# Patient Record
Sex: Male | Born: 1985 | Race: Black or African American | Hispanic: No | Marital: Single
Health system: Southern US, Community
[De-identification: ages and names within clinical notes are randomized; demographics above are authoritative.]

## PROBLEM LIST (undated history)

## (undated) ENCOUNTER — Emergency Department (HOSPITAL_COMMUNITY): Payer: Self-pay

## (undated) DIAGNOSIS — R443 Hallucinations, unspecified: Secondary | ICD-10-CM

## (undated) DIAGNOSIS — F319 Bipolar disorder, unspecified: Secondary | ICD-10-CM

## (undated) DIAGNOSIS — F29 Unspecified psychosis not due to a substance or known physiological condition: Secondary | ICD-10-CM

---
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
## 2007-08-08 ENCOUNTER — Emergency Department (HOSPITAL_COMMUNITY): Admission: EM | Admit: 2007-08-08 | Discharge: 2007-08-09 | Payer: Self-pay | Admitting: Emergency Medicine

## 2008-06-25 ENCOUNTER — Emergency Department (HOSPITAL_COMMUNITY): Admission: EM | Admit: 2008-06-25 | Discharge: 2008-06-25 | Payer: Self-pay | Admitting: Emergency Medicine

## 2008-09-11 ENCOUNTER — Emergency Department (HOSPITAL_COMMUNITY): Admission: EM | Admit: 2008-09-11 | Discharge: 2008-09-11 | Payer: Self-pay | Admitting: Emergency Medicine

## 2008-09-11 ENCOUNTER — Ambulatory Visit: Payer: Self-pay | Admitting: Psychiatry

## 2008-09-11 ENCOUNTER — Inpatient Hospital Stay (HOSPITAL_COMMUNITY): Admission: RE | Admit: 2008-09-11 | Discharge: 2008-09-15 | Payer: Self-pay | Admitting: Psychiatry

## 2008-09-14 ENCOUNTER — Ambulatory Visit (HOSPITAL_COMMUNITY): Admission: RE | Admit: 2008-09-14 | Discharge: 2008-09-14 | Payer: Self-pay | Admitting: Psychiatry

## 2009-10-17 ENCOUNTER — Emergency Department (HOSPITAL_COMMUNITY): Admission: EM | Admit: 2009-10-17 | Discharge: 2009-10-17 | Payer: Self-pay | Admitting: Emergency Medicine

## 2010-08-02 ENCOUNTER — Emergency Department (HOSPITAL_COMMUNITY)
Admission: EM | Admit: 2010-08-02 | Discharge: 2010-08-02 | Payer: Self-pay | Source: Home / Self Care | Admitting: Emergency Medicine

## 2010-10-20 LAB — BASIC METABOLIC PANEL
BUN: 7 mg/dL (ref 6–23)
Chloride: 106 mEq/L (ref 96–112)
Creatinine, Ser: 0.86 mg/dL (ref 0.4–1.5)
GFR calc non Af Amer: >60 mL/min (ref >60)

## 2010-10-20 LAB — DIFFERENTIAL
Basophils Relative: 0 % (ref 0–1)
Eosinophils Relative: 1 % (ref 0–5)
Lymphocytes Relative: 21 % (ref 12–46)
Monocytes Absolute: 0.5 10*3/uL (ref 0.1–1.0)
Monocytes Relative: 9 % (ref 3–12)
Neutro Abs: 4.3 10*3/uL (ref 1.7–7.7)

## 2010-10-20 LAB — ETHANOL: Alcohol, Ethyl (B): <5 mg/dL (ref 0–10)

## 2010-10-20 LAB — CBC
MCHC: 34.3 g/dL (ref 30.0–36.0)
MCV: 92.8 fL (ref 78.0–100.0)
RDW: 13.1 % (ref 11.5–15.5)
WBC: 6.1 10*3/uL (ref 4.0–10.5)

## 2010-10-20 LAB — RAPID URINE DRUG SCREEN, HOSP PERFORMED
Benzodiazepines: NOT DETECTED
Cocaine: NOT DETECTED
Tetrahydrocannabinol: NOT DETECTED

## 2010-11-22 NOTE — H&P (Signed)
NAMECALLAGHAN, Todd NO.:  [id number]   MEDICAL RECORD NO.:  [id number]          PATIENT TYPE:  IPS   LOCATION:  0508                          FACILITY:  [facility]   PHYSICIAN:  Vic Ripper, P.A.-C.DATE OF BIRTH:  Apr 24, 1986   DATE OF ADMISSION:  09/11/2008  DATE OF DISCHARGE:                       PSYCHIATRIC ADMISSION ASSESSMENT   This is an involuntary admission to the services of Dr. Geoffery Lyons.  The commitment papers indicate that he has had no prior psychiatric  complaints.  He is now presenting with auditory hallucinations with  religious overtones.  He has been speaking with Jesus and he believes  that he is the son of Jesus.  Apparently he has been staying with  various people for the past 3 weeks.  He called his parents at 3:00 a.m.  this morning reporting he was hearing voices. He reported that he was  abusing over-the-counter cough medicine 3 days ago and asked them to  help him. According to the record there is no prior psychiatric history.  Unfortunately his social history and family history are unobtainable as  the patient is not responding to questions at this point in time. There  is no known history for substance abuse other than him reporting abusing  cough medicine 3 days ago.  His UDS in the ED was negative.  He does not  have a primary care [name].  He has no known medical problems.  He is  not currently prescribed any medications.  He has no known drug  allergies.   POSITIVE PHYSICAL FINDINGS:  His he was medically cleared in the  emergency department.  He has not yet had a CT scan of the head, but he  was not able to cooperate with that yesterday and as soon as he is able  to cooperate and collaborate we will obtain one. His other labs showed  he had no alcohol.  His UDS was clear.  His electrolytes showed that his  glucose was slightly elevated at 110.  His CBC had no abnormalities. As  already stated his electrolytes showed only a  slight elevation of  glucose at 110.   Vital signs on admission to our unit show he is 69 inches tall.  He  weighs 168-1/2 pounds.  His temperature is 97.6, blood pressure is  156/86, pulse 64-81, respirations are 20.   He is not known to have had any surgeries.   MENTAL STATUS EXAM:  He is alert.  He is appropriately groomed and  dressed.  He appears to be responding to internal stimuli.  He has an  odd affect.  He is not speaking to me at any rate. His thought processes  were unable to be assessed and his judgment and insight appear to be  impaired. Concentration and memory he is easily distracted. Intelligence  is reported to be normal according to his parents. He is not behaving in  a self-injurious manner or manner that appears to be harmful to others  at this time.  He is actively psychotic and responding to internal  stimuli   IMPRESSION:  AXIS I:  Psychotic disorder NOS.  AXIS II:  Deferred.  AXIS III:  None known.  AXIS IV:  Severe.  AXIS V:  20.   [name] had already been seen by Dr. Lolly Mustache.  He will be transferred to  the [address]. He will be given Zyprexa 5 mg at bedtime and Zyprexa Zydis  5 mg p.o. q.8 h. p.r.n. agitation.  Estimated length of stay is unknown.      Vic Ripper, P.A.-C.     MD/MEDQ  D:  09/12/2008  T:  09/13/2008  Job:  [id number]

## 2010-11-25 NOTE — Discharge Summary (Signed)
NAMEHAGER, COMPSTON NO.:  [id number]   MEDICAL RECORD NO.:  [id number]          PATIENT TYPE:  IPS   LOCATION:  0508                          FACILITY:  [facility]   PHYSICIAN:  Geoffery Lyons, M.D.      DATE OF BIRTH:  Nov 10, 1985   DATE OF ADMISSION:  09/11/2008  DATE OF DISCHARGE:  09/15/2008                               DISCHARGE SUMMARY   CHIEF COMPLAINT:  This was the first admission to [facility] for this 25 year old male.  Commitment papers indicated he had no  prior psychiatric complaints.  Presented with auditory hallucination,  religious overtones, has been speaking with Jesus and believes that he  is the son of Jesus.  Had been staying with various people for the past  3 weeks.  He called his parents at 3:00 a.m. reporting was hearing  voices, was abusing over-the-counter cough medicine, asked for help.   PAST PSYCHIATRIC HISTORY:  Denies previous treatment or symptomatology.   ALCOHOL AND DRUG HABITS:  Reports abusing over-the-counter cough  medicine.  No other substances.   MEDICAL HISTORY:  Noncontributory.   LABORATORY WORK:  UDS was negative, glucose 110.  CBC within normal  limits.   PHYSICAL EXAMINATION:  Failed to show any acute findings.   MENTAL STATUS EXAM:  Reveals alert cooperative male but appears to be  responding to internal stimuli.  He exhibits some inappropriate affect.  Not spontaneous, somewhat reserved, somewhat guarded.  At times, he  would not share what was happening.   DIAGNOSES:  AXIS I:  Psychotic disorder NOS versus substance-induced  psychosis.  AXIS II:  No diagnosis.  AXIS III:  No diagnosis.  AXIS IV: Moderate.  AXIS V:  On admission 35-40, in the last year 60.   COURSE IN THE HOSPITAL:  Was admitted, was started in individual and  group psychotherapy.  As already stated, admitted secondary to paranoia  with auditory hallucinations.  Talking to self.  He was prescribed  Zyprexa. On March 7, he was  not seen actively responding to internal  stimuli, but did exhibit some anxiety.  March 8, he admitted to  marijuana abuse as well as cough pills to get high, endorsed the  depression happened first and then he heard voices.  He had been asked  to leave parents' home in January 10 due to not following rules.  Had  been living with the grandmother and staying with the aunt some.  Grandmother was contacted, and he felt that he was back to his baseline.  March 9, he was in full contact with reality.  No active  suicidal/homicidal ideas, no hallucinations or delusions.  Head CT scan  to rule out any other organic factors was negative.  We went ahead and  discharged to outpatient follow-up.   DISCHARGE DIAGNOSES:  AXIS I:  Marijuana hallucinogen abuse.  Psychotic  disorder not otherwise specified.  Rule out substance-induced psychoses  versus depression with psychosis.  AXIS II:  No diagnosis.  AXIS III:  No diagnosis.  AXIS IV:  Moderate.  AXIS V:  On  discharge 50.   DISCHARGE MEDICATIONS:  Discharged on Zyprexa 5 mg at bedtime.   FOLLOWUP:  Follow-up with Dr. Lang Snow at [facility]      Geoffery Lyons, M.D.  Electronically Signed     IL/MEDQ  D:  10/12/2008  T:  10/12/2008  Job:  [id number]

## 2011-02-27 ENCOUNTER — Emergency Department (HOSPITAL_COMMUNITY)
Admission: EM | Admit: 2011-02-27 | Discharge: 2011-02-27 | Disposition: A | Discharge status: Home / Self Care | Payer: Self-pay | Attending: Emergency Medicine | Admitting: Emergency Medicine

## 2011-02-27 ENCOUNTER — Emergency Department (HOSPITAL_COMMUNITY): Payer: Self-pay

## 2011-02-27 DIAGNOSIS — IMO0002 Reserved for concepts with insufficient information to code with codable children: Secondary | ICD-10-CM | POA: Insufficient documentation

## 2011-02-27 DIAGNOSIS — S20229A Contusion of unspecified back wall of thorax, initial encounter: Secondary | ICD-10-CM | POA: Insufficient documentation

## 2011-02-27 DIAGNOSIS — M545 Low back pain, unspecified: Secondary | ICD-10-CM | POA: Insufficient documentation

## 2011-02-27 DIAGNOSIS — M25529 Pain in unspecified elbow: Secondary | ICD-10-CM | POA: Insufficient documentation

## 2011-02-27 DIAGNOSIS — S5000XA Contusion of unspecified elbow, initial encounter: Secondary | ICD-10-CM | POA: Insufficient documentation

## 2011-02-27 DIAGNOSIS — F319 Bipolar disorder, unspecified: Secondary | ICD-10-CM | POA: Insufficient documentation

## 2011-04-14 ENCOUNTER — Emergency Department (HOSPITAL_COMMUNITY)
Admission: EM | Admit: 2011-04-14 | Discharge: 2011-04-14 | Disposition: A | Discharge status: Home / Self Care | Payer: Self-pay | Attending: Emergency Medicine | Admitting: Emergency Medicine

## 2011-04-14 DIAGNOSIS — F319 Bipolar disorder, unspecified: Secondary | ICD-10-CM | POA: Insufficient documentation

## 2011-04-14 DIAGNOSIS — R51 Headache: Secondary | ICD-10-CM | POA: Insufficient documentation

## 2011-04-14 LAB — URINALYSIS, ROUTINE W REFLEX MICROSCOPIC
Bilirubin Urine: NEGATIVE
Hgb urine dipstick: NEGATIVE
Ketones, ur: NEGATIVE mg/dL
Specific Gravity, Urine: 1.017 (ref 1.005–1.030)
pH: 6.5 (ref 5.0–8.0)

## 2011-08-15 ENCOUNTER — Emergency Department (HOSPITAL_COMMUNITY)
Admission: EM | Admit: 2011-08-15 | Discharge: 2011-08-15 | Disposition: A | Discharge status: Home / Self Care | Payer: Self-pay | Attending: Emergency Medicine | Admitting: Emergency Medicine

## 2011-08-15 ENCOUNTER — Encounter (HOSPITAL_COMMUNITY): Payer: Self-pay | Admitting: Emergency Medicine

## 2011-08-15 DIAGNOSIS — Z760 Encounter for issue of repeat prescription: Secondary | ICD-10-CM | POA: Insufficient documentation

## 2011-08-15 DIAGNOSIS — F39 Unspecified mood [affective] disorder: Secondary | ICD-10-CM | POA: Insufficient documentation

## 2011-08-15 DIAGNOSIS — R4586 Emotional lability: Secondary | ICD-10-CM

## 2011-08-15 DIAGNOSIS — F319 Bipolar disorder, unspecified: Secondary | ICD-10-CM | POA: Insufficient documentation

## 2011-08-15 DIAGNOSIS — Z79899 Other long term (current) drug therapy: Secondary | ICD-10-CM | POA: Insufficient documentation

## 2011-08-15 HISTORY — DX: Bipolar disorder, unspecified: F31.9

## 2011-08-15 MED ORDER — OLANZAPINE 5 MG PO TABS: 5.0000 mg | ORAL_TABLET | Freq: Every day | ORAL | Status: DC

## 2011-08-15 NOTE — ED Notes (Signed)
PT. REQUESTING PRESCRIPTION FOR ZYPREXA STATES HE RAN OUT FOR SEVERAL MONTHS , DENIES SUICIDAL OR HOMICIDAL IDEATION . CALM WITH NO DISTRESS AT TRIAGE.

## 2011-08-15 NOTE — ED Notes (Signed)
States took self off Zyprexa x1 yr ago; recently has developed interm episodes of anger and feels as if he needs to go back on Zyprexa; presently awake, alert, oriented, approp. in conversation; denies SI nor HI; appropriately dressed; however, poor self care; malodorous body; mother at bedside with pt; pt interm talking on cell phone, laughing, smiling

## 2011-08-15 NOTE — ED Provider Notes (Signed)
History     CSN: [id number]  Arrival date & time 08/15/11  2010   First MD Initiated Contact with Patient 08/15/11 2103      Chief Complaint  Patient presents with  . Medication Refill    (Consider location/radiation/quality/duration/timing/severity/associated sxs/prior treatment) HPI This 26 year old male has a history of impulse control disorder with labile mood swings which were well controlled previously with Zyprexa, recently has had some labile emotions again but is not currently anxious or depressed a lot, is not a threat to himself or others he is not suicidal or homicidal he is not hallucinating he is lucid, and cooperative at the present time and wants to be started Zyprexa so he can not have such labile emotions again his mom is with him and corroborates his story he otherwise feels well at this time. Past Medical History  Diagnosis Date  . Bipolar 1 disorder     History reviewed. No pertinent past surgical history.  No family history on file.  History  Substance Use Topics  . Smoking status: Never Smoker   . Smokeless tobacco: Not on file  . Alcohol Use: Yes      Review of Systems  Constitutional: Negative for fever.       10 Systems reviewed and are negative for acute change except as noted in the HPI.  HENT: Negative for congestion.   Eyes: Negative for discharge and redness.  Respiratory: Negative for cough and shortness of breath.   Cardiovascular: Negative for chest pain.  Gastrointestinal: Negative for vomiting and abdominal pain.  Musculoskeletal: Negative for back pain.  Skin: Negative for rash.  Neurological: Negative for syncope, numbness and headaches.  Psychiatric/Behavioral:       No behavior change.    Allergies  Review of patient's allergies indicates no known allergies.  Home Medications   Current Outpatient Rx  Name Route Sig Dispense Refill  . OLANZAPINE 5 MG PO TABS Oral Take 1 tablet (5 mg total) by mouth at bedtime. 30 tablet 0      BP 123/74  Pulse 85  Temp(Src) 98 F (36.7 C) (Oral)  Resp 20  SpO2 98%  Physical Exam  Nursing note and vitals reviewed. Constitutional: He is oriented to person, place, and time.       Awake, alert, nontoxic appearance.  HENT:  Head: Atraumatic.  Eyes: Right eye exhibits no discharge. Left eye exhibits no discharge.  Neck: Neck supple.  Pulmonary/Chest: Effort normal. He exhibits no tenderness.  Abdominal: Soft. There is no tenderness. There is no rebound.  Musculoskeletal: He exhibits no tenderness.       Baseline ROM, no obvious new focal weakness.  Neurological: He is alert and oriented to person, place, and time.       Mental status and motor strength appears baseline for patient and situation.  Skin: No rash noted.  Psychiatric: He has a normal mood and affect.    ED Course  Procedures (including critical care time)  Labs Reviewed - No data to display No results found.   1. Medication refill   2. Mood swings       MDM          Hurman Horn, MD 08/16/11 2109

## 2011-09-30 ENCOUNTER — Emergency Department (HOSPITAL_COMMUNITY)
Admission: EM | Admit: 2011-09-30 | Discharge: 2011-09-30 | Disposition: A | Discharge status: Home / Self Care | Payer: Self-pay | Attending: Emergency Medicine | Admitting: Emergency Medicine

## 2011-09-30 ENCOUNTER — Encounter (HOSPITAL_COMMUNITY): Payer: Self-pay | Admitting: Emergency Medicine

## 2011-09-30 DIAGNOSIS — F319 Bipolar disorder, unspecified: Secondary | ICD-10-CM | POA: Insufficient documentation

## 2011-09-30 DIAGNOSIS — R51 Headache: Secondary | ICD-10-CM | POA: Insufficient documentation

## 2011-09-30 MED ORDER — KETOROLAC TROMETHAMINE 60 MG/2ML IM SOLN
60.0000 mg | Freq: Once | INTRAMUSCULAR | Status: AC
  Administered 2011-09-30: 60 mg via INTRAMUSCULAR
  Filled 2011-09-30: qty 2

## 2011-09-30 MED ORDER — IBUPROFEN 400 MG PO TABS: 400.0000 mg | ORAL_TABLET | Freq: Four times a day (QID) | ORAL | Status: AC | PRN

## 2011-09-30 MED ORDER — LORATADINE 10 MG PO TABS: 10.0000 mg | ORAL_TABLET | Freq: Every day | ORAL | Status: DC

## 2011-09-30 NOTE — Discharge Instructions (Signed)
Headache and Allergies The relationship between allergies and headaches is unclear. Many people with allergic or infectious nasal problems also have headaches (migraines or sinus headaches). However, sometimes allergies can cause pressure that feels like a headache, and sometimes headaches can cause allergy-like symptoms. It is not always clear whether your symptoms are caused by allergies or by a headache. CAUSES   Migraine: The cause of a migraine is not always known.   Sinus Headache: The cause of a sinus headache may be a sinus infection. Other conditions that may be related to sinus headaches include:   Hay fever (allergic rhinitis).   Deviation of the nasal septum.   Swelling or clogging of the nasal passages.  SYMPTOMS  Migraine headache symptoms (which often last 4 to 72 hours) include:  Intense, throbbing pain on one or both sides of the head.   Nausea.   Vomiting.   Being extra sensitive to light.   Being extra sensitive to sound.   Nervous system reactions that appear similar to an allergic reaction:   Stuffy nose.   Runny nose.   Tearing.  Sinus headaches are felt as facial pain or pressure.  DIAGNOSIS  Because there is some overlap in symptoms, sinus and migraine headaches are often misdiagnosed. For example, a person with migraines may also feel facial pressure. Likewise, many people with hay fever may get migraine headaches rather than sinus headaches. These migraines can be triggered by the histamine release during an allergic reaction. An antihistamine medicine can eliminate this pain. There are standard criteria that help clarify the difference between these headaches and related allergy or allergy-like symptoms. Your caregiver can use these criteria to determine the proper diagnosis and provide you the best care. TREATMENT  Migraine medicine may help people who have persistent migraine headaches even though their hay fever is controlled. For some people,  anti-inflammatory treatments do not work to relieve migraines. Medicines called triptans (such as sumatriptan) can be helpful for those people. Document Released: 09/16/2003 Document Revised: 06/15/2011 Document Reviewed: 10/08/2009 [facility] Patient Information 2012 [place], [place].  Return for any new or worsening symptoms or any other concerns.

## 2011-09-30 NOTE — ED Notes (Addendum)
Per pt; reports he has been having bil ear pain and headaches for 1-2 days; no drainage noted on assessment; pt reports he does not see any drainage; pt a&OX4, in NAD

## 2011-09-30 NOTE — ED Provider Notes (Signed)
History     CSN: [id number]  Arrival date & time 09/30/11  1745   First MD Initiated Contact with Patient 09/30/11 1855      Chief Complaint  Patient presents with  . Otalgia    (Consider location/radiation/quality/duration/timing/severity/associated sxs/prior treatment) HPI CC HA off and on for 2 weeks, global, throbbing, slow onset, mild to moderate.  Pt also states he has had mild bilat ear pain for the past 2 days, none now.   Past Medical History  Diagnosis Date  . Bipolar 1 disorder     History reviewed. No pertinent past surgical history.  History reviewed. No pertinent family history.  History  Substance Use Topics  . Smoking status: Never Smoker   . Smokeless tobacco: Not on file  . Alcohol Use: No      Review of Systems  Constitutional: Negative for fever and chills.  HENT: Positive for ear pain. Negative for sore throat, neck pain and neck stiffness.   Eyes: Negative for visual disturbance.  Respiratory: Negative for cough and shortness of breath.   Musculoskeletal: Negative for gait problem.  Neurological: Positive for headaches. Negative for speech difficulty, weakness, light-headedness and numbness.  All other systems reviewed and are negative.    Allergies  Review of patient's allergies indicates no known allergies.  Home Medications   Current Outpatient Rx  Name Route Sig Dispense Refill  . ARIPIPRAZOLE 5 MG PO TABS Oral Take 5 mg by mouth daily.    . IBUPROFEN 400 MG PO TABS Oral Take 1 tablet (400 mg total) by mouth every 6 (six) hours as needed for pain. 30 tablet 0  . LORATADINE 10 MG PO TABS Oral Take 1 tablet (10 mg total) by mouth daily. 30 tablet 0    BP 138/90  Pulse 85  Temp(Src) 97.3 F (36.3 C) (Oral)  Resp 20  SpO2 98%ra wnl  Physical Exam  Nursing note and vitals reviewed. Constitutional: He is oriented to person, place, and time. He appears well-developed and well-nourished.  HENT:  Head: Normocephalic and atraumatic.   Right Ear: Hearing, tympanic membrane, external ear and ear canal normal. No tenderness.  Left Ear: Hearing, tympanic membrane, external ear and ear canal normal. No tenderness.  Eyes: EOM are normal. Pupils are equal, round, and reactive to light. Right eye exhibits no discharge. Left eye exhibits no discharge. Left eye exhibits no nystagmus.  Neck: Normal range of motion. Neck supple.  Cardiovascular: Normal rate, regular rhythm and normal heart sounds.   Pulmonary/Chest: Effort normal and breath sounds normal.  Abdominal: Soft. There is no tenderness.  Musculoskeletal: Normal range of motion. He exhibits no tenderness.  Neurological: He is alert and oriented to person, place, and time. He has normal strength. No cranial nerve deficit or sensory deficit. Coordination and gait normal. GCS eye subscore is 4. GCS verbal subscore is 5. GCS motor subscore is 6.  Skin: Skin is warm and dry.  Psychiatric: He has a normal mood and affect. His behavior is normal.    ED Course  Procedures (including critical care time)  Labs Reviewed - No data to display No results found.   1. Headache       MDM  Pt is in nad, afvss, nontoxic appearing, exam and hx as above.  Doubt infection, no dental source on exam, doubt ich, nl neuro exam including no trunkal or limb abnl coordination, nl gait, doubt mass lesion.  Likely tension or nonspecific HA, possibly related to seasonal allergies.  Will give  toradol, d/c with ibuprofen and loratidine.        Elijio Miles, MD 09/30/11 [phone]

## 2011-09-30 NOTE — ED Provider Notes (Signed)
I saw and evaluated the patient, reviewed the resident's note and I agree with the findings and plan.  Gradual onset headaches off-and-on over the last week with no headache now.  Hurman Horn, MD 10/02/11 2224

## 2012-09-13 ENCOUNTER — Emergency Department (HOSPITAL_COMMUNITY)
Admission: EM | Admit: 2012-09-13 | Discharge: 2012-09-13 | Disposition: A | Discharge status: Home / Self Care | Payer: Self-pay | Attending: Emergency Medicine | Admitting: Emergency Medicine

## 2012-09-13 DIAGNOSIS — R51 Headache: Secondary | ICD-10-CM | POA: Insufficient documentation

## 2012-09-13 DIAGNOSIS — Z79899 Other long term (current) drug therapy: Secondary | ICD-10-CM | POA: Insufficient documentation

## 2012-09-13 DIAGNOSIS — F319 Bipolar disorder, unspecified: Secondary | ICD-10-CM | POA: Insufficient documentation

## 2012-09-13 DIAGNOSIS — R112 Nausea with vomiting, unspecified: Secondary | ICD-10-CM | POA: Insufficient documentation

## 2012-09-13 MED ORDER — SODIUM CHLORIDE 0.9 % IV SOLN
INTRAVENOUS | Status: DC
  Administered 2012-09-13: 21:00:00 via INTRAVENOUS

## 2012-09-13 MED ORDER — DIPHENHYDRAMINE HCL 50 MG/ML IJ SOLN
25.0000 mg | Freq: Once | INTRAMUSCULAR | Status: AC
  Administered 2012-09-13: 25 mg via INTRAVENOUS
  Filled 2012-09-13: qty 1

## 2012-09-13 MED ORDER — METOCLOPRAMIDE HCL 5 MG/ML IJ SOLN
10.0000 mg | Freq: Once | INTRAMUSCULAR | Status: AC
  Administered 2012-09-13: 10 mg via INTRAVENOUS
  Filled 2012-09-13: qty 2

## 2012-09-13 MED ORDER — ONDANSETRON HCL 4 MG PO TABS: 4.0000 mg | ORAL_TABLET | Freq: Three times a day (TID) | ORAL | Status: DC | PRN

## 2012-09-13 MED ORDER — KETOROLAC TROMETHAMINE 30 MG/ML IJ SOLN
30.0000 mg | Freq: Once | INTRAMUSCULAR | Status: AC
  Administered 2012-09-13: 30 mg via INTRAVENOUS
  Filled 2012-09-13: qty 1

## 2012-09-13 NOTE — ED Notes (Signed)
The pt has had a headache with n v for 2 days

## 2012-09-13 NOTE — ED Provider Notes (Signed)
Medical screening examination/treatment/procedure(s) were performed by non-physician practitioner and as supervising physician I was immediately available for consultation/collaboration.   Tariq Pernell, MD 09/13/12 2209 

## 2012-09-13 NOTE — ED Provider Notes (Signed)
History     CSN: [id number]  Arrival date & time 09/13/12  2014   First MD Initiated Contact with Patient 09/13/12 2036      Chief Complaint  Patient presents with  . Headache    (Consider location/radiation/quality/duration/timing/severity/associated sxs/prior treatment) HPI Comments: This is a 27 year old male, no pertinent past medical history, who presents emergency department with chief complaint of headache. Patient states that the headache started approximately 2 days ago, and has gradually worsened. He has associated nausea and vomiting. He has tried taking ibuprofen with little to no relief. States that the pain is 8/10. It does not radiate. He denies photophobia, phonophobia. He states that nothing makes his symptoms better or worse. He denies fever, chills, chest pain, shortness of breath, abdominal pain, diarrhea, constipation, numbness or tingling of the extremities.  The history is provided by the patient. No language interpreter was used.    Past Medical History  Diagnosis Date  . Bipolar 1 disorder     No past surgical history on file.  No family history on file.  History  Substance Use Topics  . Smoking status: Never Smoker   . Smokeless tobacco: Not on file  . Alcohol Use: No      Review of Systems  All other systems reviewed and are negative.    Allergies  Review of patient's allergies indicates no known allergies.  Home Medications   Current Outpatient Rx  Name  Route  Sig  Dispense  Refill  . ARIPiprazole (ABILIFY) 5 MG tablet   Oral   Take 5 mg by mouth daily.         Marland Kitchen loratadine (CLARITIN) 10 MG tablet   Oral   Take 1 tablet (10 mg total) by mouth daily.   30 tablet   0   . EXPIRED: OLANZapine (ZYPREXA) 5 MG tablet   Oral   Take 1 tablet (5 mg total) by mouth at bedtime.   30 tablet   0     BP 120/67  Pulse 96  Temp(Src) 98 F (36.7 C) (Oral)  Resp 20  SpO2 97%  Physical Exam  Nursing note and vitals  reviewed. Constitutional: He is oriented to person, place, and time. He appears well-developed and well-nourished.  HENT:  Head: Normocephalic and atraumatic.  Right Ear: External ear normal.  Left Ear: External ear normal.  No increased pain with chewing.  Eyes: Conjunctivae and EOM are normal. Pupils are equal, round, and reactive to light.  No appreciable papilledema  Neck: Normal range of motion. Neck supple.  No pain with neck flexion, no meningismus  Cardiovascular: Normal rate, regular rhythm and normal heart sounds.  Exam reveals no gallop and no friction rub.   No murmur heard. Pulmonary/Chest: Effort normal and breath sounds normal. No respiratory distress. He has no wheezes. He has no rales. He exhibits no tenderness.  Abdominal: Soft. Bowel sounds are normal. He exhibits no distension and no mass. There is no tenderness. There is no rebound and no guarding.  Musculoskeletal: Normal range of motion. He exhibits no edema and no tenderness.  Normal gait.  Neurological: He is alert and oriented to person, place, and time. He has normal reflexes.  CN 3-12 intact, no pronator drift, normal shin to heel, normal RAM, sensation and strength intact bilaterally.  Skin: Skin is warm and dry.  Psychiatric: He has a normal mood and affect. His behavior is normal. Judgment and thought content normal.    ED Course  Procedures (including critical  care time)  Labs Reviewed - No data to display No results found.   1. Headache   2. Nausea and vomiting       MDM  27 year old male with headache, nausea and vomiting.  No thunderclap onset, no fevers, no signs of meningitis, no focal neurologic deficits.  Patient is feeling much better after migraine cocktail.  He is stable and ready for discharge.  I will send him home with zofran for the nausea.  Discussed the patient with Dr. Manus Gunning, who agrees with the plan.        Roxy Horseman, PA-C 09/13/12 2150

## 2012-09-13 NOTE — ED Notes (Signed)
Pt reports nausea and vomiting since yesterday. Reports developing a headache post nausea and vomiting. States headache has gotten progressively worse. Rates headache pain 8/10. Pt reports last emesis was an hour prior to arrival. Reports taking ibuprofen approximately 2 hrs prior to arrival with no relief. Pt denies photophobia, dizziness, numbness, or tingling in extremities. Pt neuro intact, alert and oriented x 4.

## 2012-09-13 NOTE — ED Notes (Signed)
Out to d/c desk, friend on his way to pick him up from w/r, alert, NAD, calm, interactive, "feels better", denies pain, nausea or HA, tolerated PO fluids, given Rx, steady gait.

## 2013-12-01 ENCOUNTER — Encounter (HOSPITAL_COMMUNITY): Payer: Self-pay | Admitting: Emergency Medicine

## 2013-12-01 ENCOUNTER — Emergency Department (HOSPITAL_COMMUNITY)
Admission: EM | Admit: 2013-12-01 | Discharge: 2013-12-01 | Disposition: A | Discharge status: Home / Self Care | Payer: Self-pay | Attending: Emergency Medicine | Admitting: Emergency Medicine

## 2013-12-01 DIAGNOSIS — R6889 Other general symptoms and signs: Secondary | ICD-10-CM | POA: Insufficient documentation

## 2013-12-01 DIAGNOSIS — G4710 Hypersomnia, unspecified: Secondary | ICD-10-CM | POA: Insufficient documentation

## 2013-12-01 DIAGNOSIS — R634 Abnormal weight loss: Secondary | ICD-10-CM | POA: Insufficient documentation

## 2013-12-01 DIAGNOSIS — F329 Major depressive disorder, single episode, unspecified: Secondary | ICD-10-CM

## 2013-12-01 DIAGNOSIS — F319 Bipolar disorder, unspecified: Secondary | ICD-10-CM | POA: Insufficient documentation

## 2013-12-01 DIAGNOSIS — R519 Headache, unspecified: Secondary | ICD-10-CM

## 2013-12-01 DIAGNOSIS — R5381 Other malaise: Secondary | ICD-10-CM | POA: Insufficient documentation

## 2013-12-01 DIAGNOSIS — F411 Generalized anxiety disorder: Secondary | ICD-10-CM | POA: Insufficient documentation

## 2013-12-01 DIAGNOSIS — G4700 Insomnia, unspecified: Secondary | ICD-10-CM | POA: Insufficient documentation

## 2013-12-01 DIAGNOSIS — R630 Anorexia: Secondary | ICD-10-CM | POA: Insufficient documentation

## 2013-12-01 DIAGNOSIS — R5383 Other fatigue: Secondary | ICD-10-CM

## 2013-12-01 DIAGNOSIS — F430 Acute stress reaction: Secondary | ICD-10-CM | POA: Insufficient documentation

## 2013-12-01 DIAGNOSIS — R51 Headache: Secondary | ICD-10-CM | POA: Insufficient documentation

## 2013-12-01 DIAGNOSIS — F32A Depression, unspecified: Secondary | ICD-10-CM

## 2013-12-01 NOTE — ED Provider Notes (Addendum)
CSN: [id number]     Arrival date & time 12/01/13  1357 History   First MD Initiated Contact with Patient 12/01/13 1617     Chief Complaint  Patient presents with  . Headache  . Anxiety     (Consider location/radiation/quality/duration/timing/severity/associated sxs/prior Treatment) Patient is a 28 y.o. male presenting with headaches, anxiety, and mental health disorder. The history is provided by the patient.  Headache Pain location:  Frontal Quality:  Dull Radiates to:  Does not radiate Severity currently:  8/10 Severity at highest:  6/10 Onset quality:  Gradual Duration:  2 weeks Timing:  Intermittent Progression:  Waxing and waning Chronicity:  Recurrent Similar to prior headaches: yes   Context: emotional stress   Relieved by:  None tried Exacerbated by: not getting adequate sleep. Ineffective treatments:  None tried Associated symptoms: fatigue   Associated symptoms: no blurred vision, no focal weakness, no loss of balance, no neck pain, no neck stiffness, no numbness, no photophobia and no vomiting   Anxiety Associated symptoms include headaches.  Mental Health Problem Presenting symptoms: depression   Presenting symptoms: no suicidal thoughts and no suicidal threats   Degree of incapacity (severity):  Moderate Onset quality:  Gradual Timing:  Constant Progression:  Worsening Chronicity:  Recurrent Context: not alcohol use, not drug abuse, not medication, not recent medication change and not stressful life event   Context comment:  States dx with bipolar and on meds about 3 years ago but has been off for the last 2 years and good until recentl.  denies significant event causing sx Treatment compliance:  Untreated Time since last psychoactive medication taken:  2 years Relieved by:  None tried (not better with sleep) Ineffective treatments:  None tried Associated symptoms: anhedonia, appetite change, distractible, fatigue, headaches, insomnia and weight change    Associated symptoms comment:  Trouble focusing Risk factors: hx of mental illness   Risk factors: no recent psychiatric admission     Past Medical History  Diagnosis Date  . Bipolar 1 disorder    History reviewed. No pertinent past surgical history. History reviewed. No pertinent family history. History  Substance Use Topics  . Smoking status: Never Smoker   . Smokeless tobacco: Not on file  . Alcohol Use: No    Review of Systems  Constitutional: Positive for appetite change and fatigue.  Eyes: Negative for blurred vision and photophobia.  Gastrointestinal: Negative for vomiting.  Musculoskeletal: Negative for neck pain and neck stiffness.  Neurological: Positive for headaches. Negative for focal weakness, numbness and loss of balance.  Psychiatric/Behavioral: Negative for suicidal ideas. The patient has insomnia.   All other systems reviewed and are negative.     Allergies  Review of patient's allergies indicates no known allergies.  Home Medications   Prior to Admission medications   Not on File   BP 135/96  Pulse 76  Temp(Src) 97.9 F (36.6 C) (Oral)  Resp 18  SpO2 96% Physical Exam  Nursing note and vitals reviewed. Constitutional: He is oriented to person, place, and time. He appears well-developed and well-nourished. No distress.  HENT:  Head: Normocephalic and atraumatic.  Mouth/Throat: Oropharynx is clear and moist.  Eyes: Conjunctivae and EOM are normal. Pupils are equal, round, and reactive to light.  Neck: Normal range of motion. Neck supple. No thyromegaly present.  Cardiovascular: Normal rate, regular rhythm and intact distal pulses.   No murmur heard. Pulmonary/Chest: Effort normal and breath sounds normal. No respiratory distress. He has no wheezes. He has no rales.  Abdominal: Soft. He exhibits no distension. There is no tenderness. There is no rebound and no guarding.  Musculoskeletal: Normal range of motion. He exhibits no edema and no  tenderness.  Neurological: He is alert and oriented to person, place, and time.  Skin: Skin is warm and dry. No rash noted. No erythema.  Psychiatric: His speech is normal and behavior is normal. He is not actively hallucinating. He exhibits a depressed mood. He expresses no homicidal and no suicidal ideation. He expresses no suicidal plans and no homicidal plans.    ED Course  Procedures (including critical care time) Labs Review Labs Reviewed - No data to display  Imaging Review No results found.   EKG Interpretation None      MDM   Final diagnoses:  Depression  Generalized headache    Patient here with benign headaches that occur usually towards the end of the day and are worse when he does not get significant sleep. He denies any symptoms concerning for pathologic headache. Low suspicion for space occupying lesion, bleed, subarachnoid hemorrhage. Patient has tried no medication for the headaches recommended Tylenol or ibuprofen. Secondly patient is here complaining of worsening symptoms consistent with depression with fatigue, and periods of insomnia and hypersomnia, and decreased appetite and weight loss. He denies suicidal ideation or hallucinations. He does have a prior history of bipolar disease and hospitalization approximately 3 years ago at which time he was taking medication but has been off all medications for the last 2 years and has been doing well until the last 2 weeks to one month. He is having difficulty focusing at work.  Patient was given outpatient resources [facility]. At this time do not feel that he needs any further evaluation. There is no symptoms concerning for thyroid disease and vital signs are within normal limits with a normal physical exam.    Gwyneth SproutWhitney Ellysia Char, MD 12/01/13 1640  Gwyneth SproutWhitney Tanganyika Bowlds, MD 12/01/13 1642

## 2013-12-01 NOTE — ED Notes (Signed)
Pt brought back to room; pt getting undressed and into a gown at this time; Natasha, NT aware 

## 2013-12-01 NOTE — ED Notes (Signed)
Pt presents to department for evaluation of headache and anxiety. States headache for several days. States he is very stressed out and anxious, unable to focus at work. Denies nausea/vomiting. Denies blurred vision. 8/10 pain upon arrival. No neurological deficits noted. Pt is alert and oriented x4. NAD.

## 2014-05-22 ENCOUNTER — Emergency Department (HOSPITAL_COMMUNITY): Payer: No Typology Code available for payment source

## 2014-05-22 ENCOUNTER — Encounter (HOSPITAL_COMMUNITY): Payer: Self-pay | Admitting: Emergency Medicine

## 2014-05-22 ENCOUNTER — Emergency Department (HOSPITAL_COMMUNITY)
Admission: EM | Admit: 2014-05-22 | Discharge: 2014-05-22 | Disposition: A | Discharge status: Home / Self Care | Payer: No Typology Code available for payment source | Attending: Emergency Medicine | Admitting: Emergency Medicine

## 2014-05-22 DIAGNOSIS — S3992XA Unspecified injury of lower back, initial encounter: Secondary | ICD-10-CM | POA: Insufficient documentation

## 2014-05-22 DIAGNOSIS — Y998 Other external cause status: Secondary | ICD-10-CM | POA: Insufficient documentation

## 2014-05-22 DIAGNOSIS — M545 Low back pain, unspecified: Secondary | ICD-10-CM

## 2014-05-22 DIAGNOSIS — M542 Cervicalgia: Secondary | ICD-10-CM

## 2014-05-22 DIAGNOSIS — S199XXA Unspecified injury of neck, initial encounter: Secondary | ICD-10-CM | POA: Insufficient documentation

## 2014-05-22 DIAGNOSIS — Z8659 Personal history of other mental and behavioral disorders: Secondary | ICD-10-CM | POA: Insufficient documentation

## 2014-05-22 DIAGNOSIS — M6289 Other specified disorders of muscle: Secondary | ICD-10-CM

## 2014-05-22 DIAGNOSIS — Y92410 Unspecified street and highway as the place of occurrence of the external cause: Secondary | ICD-10-CM | POA: Insufficient documentation

## 2014-05-22 DIAGNOSIS — Y9389 Activity, other specified: Secondary | ICD-10-CM | POA: Insufficient documentation

## 2014-05-22 MED ORDER — HYDROCODONE-ACETAMINOPHEN 5-325 MG PO TABS
2.0000 | ORAL_TABLET | Freq: Once | ORAL | Status: AC
  Administered 2014-05-22: 2 via ORAL
  Filled 2014-05-22: qty 2

## 2014-05-22 MED ORDER — CYCLOBENZAPRINE HCL 10 MG PO TABS: 10.0000 mg | ORAL_TABLET | Freq: Two times a day (BID) | ORAL | Status: DC | PRN
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
## 2014-05-22 NOTE — Discharge Instructions (Signed)
Return to the emergency room with worsening of symptoms, new symptoms or with symptoms that are concerning, especially fevers, loss of control of bladder or bowels, numbness or tingling around genital region or anus, weakness. RICE: Rest, Ice (three cycles of 20 mins on, 20mins off at least twice a day), compression/brace, elevation. Heating pad works well for back pain. Ibuprofen 400mg  (2 tablets 200mg ) every 5-6 hours for 3-5 days and then as needed for pain. Flexeril for severe pain. Do not operate machinery, drive or drink alcohol while taking narcotics or muscle relaxers. Follow up with PCP/orthopedist if symptoms worsen or are persistent.   Emergency Department Resource Guide 1) Find a Doctor and Pay Out of Pocket Although you won't have to find out who is covered by your insurance plan, it is a good idea to ask around and get recommendations. You will then need to call the office and see if the doctor you have chosen will accept you as a new patient and what types of options they offer for patients who are self-pay. Some doctors offer discounts or will set up payment plans for their patients who do not have insurance, but you will need to ask so you aren't surprised when you get to your appointment.  2) Contact Your Local Health Department Not all health departments have doctors that can see patients for sick visits, but many do, so it is worth a call to see if yours does. If you don't know where your local health department is, you can check in your phone book. The CDC also has a tool to help you locate your state's health department, and many state websites also have listings of all of their local health departments.  3) Find a Walk-in Clinic If your illness is not likely to be very severe or complicated, you may want to try a walk in clinic. These are popping up all over the country in pharmacies, drugstores, and shopping centers. They're usually staffed by nurse practitioners or physician  assistants that have been trained to treat common illnesses and complaints. They're usually fairly quick and inexpensive. However, if you have serious medical issues or chronic medical problems, these are probably not your best option.  No Primary Care Doctor: - Call Health Connect at  [phone] - they can help you locate a primary care doctor that  accepts your insurance, provides certain services, etc. - Physician Referral Service- [phone]  Chronic Pain Problems: Organization         Address  Phone   Notes  [facility]  [phone]) [phone] Patients need to be referred by their primary care doctor.   Medication Assistance: Organization         Address  Phone   Notes  [facility] [address] [place], [place] [address] [phone]) [phone] --Must be a resident of [place] -- Must have NO insurance coverage whatsoever (no Medicaid/ Medicare, etc.) -- The pt. MUST have a primary care doctor that directs their care regularly and follows them in the community   MedAssist  [phone]   [employer]  [phone]    Agencies that provide inexpensive medical care: Organization         Address  Phone   Notes  [facility]  [phone]) [phone]   [facility]    [phone]) [phone]   [facility] [address] [place], [place] [address] [phone]) [phone]   [facility] [place] [address] [place] [place] [phone]   Planned Parenthood    [phone]   [facility]    [phone]   [facility]  [address], [place] Phone:  [phone], Fax:  [phone] Hours of Operation:  9 am - 6 pm, M-F.  Also accepts Medicaid/Medicare and self-pay.  [facility]  [address], [place] Phone: [phone], Fax: [phone]. Hours of Operation:  8:30 am - 5:30 pm, M-F.  Also accepts  Medicaid and self-pay.  [facility] [place] [address] [place] Phone: [phone]   [facility] [address] Natasha Bence [place], [place] [phone], Ext. [phone] Mondays & Thursdays: 7-9 AM.  First 15 patients are seen on a first come, first serve basis.    Medicaid-accepting [facility] Providers:  Organization         Address  Phone   Notes  [facility] [address], [place] [phone] Also accepts self-pay patients.  [facility] [address] Laurell Josephs [place], [place]  [phone]   [facility] [address], [place] [phone]   [facility] [address] [place] [phone]   Renaye Rakers [address], [place]   [phone] Only accepts [place] patients after they have their name applied to their card.   Self-Pay (no insurance) in [facility]:  Organization         Address  Phone   Notes  Sickle Cell Patients, [facility] [address] [place], [place] [phone]   [facility] [address] [place], [place] [phone]   [facility]  [address], [place] [phone]   [facility]/Dr. Osei-Bonsu  [address] [place] or [address], [place] [phone] Phone number for both [place] and [place] locations is the same.  [facility] [address] [place] [phone]   [facility] [address], [place] or [address] [phone]   [facility] [address], [place] [phone], phone; [phone], fax Sees patients 1st and 3rd Saturday of every month.  Must not qualify for public or private insurance (i.e. Medicaid, Medicare, [place] Health Choice, Veterans' Benefits)  Household  income should be no more than 200% of the poverty level The clinic cannot treat you if you are pregnant or think you are pregnant  Sexually transmitted diseases are not treated at the clinic.    Dental Care: Organization         Address  Phone  Notes  [facility] Department of [facility] [address] [place], [place] [phone] Accepts children up to age 77 who are enrolled in [place] or [place] Health Choice; pregnant women with a Medicaid card; and children who have applied for Medicaid or [place] Health Choice, but were declined, whose parents can pay a reduced fee at time of service.  [facility]  [address], [place] [phone] Accepts children up to age 40 who are enrolled in [place] or [place] Health Choice; pregnant women with a Medicaid card; and children  who have applied for Medicaid or [place] Health Choice, but were declined, whose parents can pay a reduced fee at time of service.  [facility]  [address] [place], [place] [phone] Patients are seen by appointment only. Walk-ins are not accepted. [facility] will see patients 43 years of age and older. Monday - Tuesday (8am-5pm) Most Wednesdays (8:30-5pm) $30 per visit, cash only  [facility]  [address], [facility] [phone] Patients are seen by appointment only. Walk-ins are not accepted. [facility] will see patients 12 years of age and older. One Wednesday Evening (Monthly: Volunteer Based).  $30 per visit, cash only  [employer]  [phone] for adults; Children under age 15, call Graduate Pediatric Dentistry at [phone]. Children aged 20-14, please call [phone] to request a pediatric application.  Dental services are provided in all areas of dental care including fillings, crowns and bridges, complete and partial dentures, implants, gum  treatment, root canals, and extractions. Preventive care is also provided. Treatment is provided to both adults and children. Patients are selected via a lottery and there is often a waiting list.   [facility] [address], [place]  [phone] [email or web address]   [facility] [address] [place], [place] [phone], Ext. [phone] Second and Fourth Thursday of each month, opens at 6:30 AM; Clinic ends at 9 AM.  Patients are seen on a first-come first-served basis, and a limited number are seen during each clinic.   [facility]  [address] Ether [place], [place] [phone]   Eligibility Requirements You must have lived in [place], [place], or [place] counties for at least the last three months.   You cannot be eligible for state or federal sponsored [place], including [employer], [place], or [employer].   You generally cannot be eligible for healthcare insurance through your employer.    How to apply: Eligibility screenings are held every Tuesday and Wednesday afternoon from 1:00 pm until 4:00 pm. You do not need an appointment for the interview!  [facility] [address], [place], [place] [phone]   [facility] Health Department  [phone]   [facility]  [phone]   [facility] Health Department  [phone]    Behavioral Health Resources in the Community: Intensive Outpatient Programs Organization         Address  Phone  Notes  [facility] [address], [place], [place] [phone]   [facility] Outpatient [address], [place], [place] [phone]   [facility]: [facility] [address] [place], [place]  [phone]   [facility] [address] [address]  [place], [place] [phone] or [phone]   Substance Abuse Resources Organization         Address  Phone  Notes  Alcohol and  Drug Services  [phone]   [facility]  [phone]   [facility] [place]  [phone]   Floydene Flock  [phone]   Residential & Outpatient Substance Abuse Program  [phone]   Psychological Services Organization         Address  Phone  Notes  [facility] Behavioral Health  [phone]   [facility] Services  [phone]   [facility] [address], [place] [phone] or [phone]    Mobile Crisis Teams Organization  Address  Phone  Notes  [facility], Mobile Crisis Care Unit  [phone]   [facility]  [address] [place], [place] [phone]   [facility] [address] [place] [phone]    Self-Help/Support Groups Organization         Address  Phone             Notes  [facility] [place] - variety of support groups  336- [phone] Call for more information  Narcotics Anonymous (NA), [facility] [address], [place]  2 meetings at this location   Statisticianesidential Treatment Programs Organization         Address  Phone  Notes  [facility] [address],    [place]  [phone]   [facility]  [address], [place] [phone], [place], [place] [phone]   [facility] [address], [place] [phone] Admissions: 8am-3pm M-F  [facility] [address]    [place], [place] [phone]   [facility] [address], [place], [place] [phone]   [facility] [address]  [place], [place] [phone]   [facility] - Intensive Outpatient [address], [place], [place] [phone]   [facility] ([facility] [address]  [place], [place] [phone] or [phone]   [facility] ([facility]) [address] [place], [place] [phone] Accepts Medicaid  Fellowship  [place] [address]  [place] [phone] Substance Abuse/Addiction Treatment   [facility] Behavioral Health Resources Organization         Address  Phone  Notes  [facility]  [phone](888) [phone]   Angie FavaJulie Brannon, PhD [address] [place], [place]   [phone](336) [phone] or [phone]) [phone]   [facility]   [address] [place], [place] [phone](336) [phone]   [facility] [address], [place], [place] [phone]) [phone] Insurance/Medicaid/sponsorship through [facility]  [facility] [address]                                    [place], [place] [phone]) [phone] Therapy/tele-psych/case  [facility] [address].   [name], [place] [phone](336) [phone]    Dr. Lolly MustacheArfeen  [phone](336) [phone]   [facility] [place]  United [employer] [facility] 1) [address], [place] 2) [address], [place] 3)  [address], [place] [phone]) [phone]) [phone]  [phone]) [phone]   [facility] Child Abuse Hotline [phone](336) [phone] or [phone](336) [phone] (After Hours)
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
## 2014-11-29 ENCOUNTER — Emergency Department (HOSPITAL_COMMUNITY)
Admission: EM | Admit: 2014-11-29 | Discharge: 2014-11-29 | Disposition: A | Discharge status: Home / Self Care | Payer: Self-pay | Attending: Emergency Medicine | Admitting: Emergency Medicine

## 2014-11-29 ENCOUNTER — Encounter (HOSPITAL_COMMUNITY): Payer: Self-pay | Admitting: Physical Medicine and Rehabilitation

## 2014-11-29 DIAGNOSIS — Z23 Encounter for immunization: Secondary | ICD-10-CM | POA: Insufficient documentation

## 2014-11-29 DIAGNOSIS — Z8659 Personal history of other mental and behavioral disorders: Secondary | ICD-10-CM | POA: Insufficient documentation

## 2014-11-29 DIAGNOSIS — S01112A Laceration without foreign body of left eyelid and periocular area, initial encounter: Secondary | ICD-10-CM

## 2014-11-29 DIAGNOSIS — Y9289 Other specified places as the place of occurrence of the external cause: Secondary | ICD-10-CM | POA: Insufficient documentation

## 2014-11-29 DIAGNOSIS — Y9389 Activity, other specified: Secondary | ICD-10-CM | POA: Insufficient documentation

## 2014-11-29 DIAGNOSIS — W51XXXA Accidental striking against or bumped into by another person, initial encounter: Secondary | ICD-10-CM | POA: Insufficient documentation

## 2014-11-29 DIAGNOSIS — Y998 Other external cause status: Secondary | ICD-10-CM | POA: Insufficient documentation

## 2014-11-29 DIAGNOSIS — H0289 Other specified disorders of eyelid: Secondary | ICD-10-CM

## 2014-11-29 DIAGNOSIS — S01119A Laceration without foreign body of unspecified eyelid and periocular area, initial encounter: Secondary | ICD-10-CM | POA: Insufficient documentation

## 2014-11-29 DIAGNOSIS — Z79899 Other long term (current) drug therapy: Secondary | ICD-10-CM | POA: Insufficient documentation

## 2014-11-29 MED ORDER — TETANUS-DIPHTH-ACELL PERTUSSIS 5-2.5-18.5 LF-MCG/0.5 IM SUSP
0.5000 mL | Freq: Once | INTRAMUSCULAR | Status: AC
  Administered 2014-11-29: 0.5 mL via INTRAMUSCULAR
  Filled 2014-11-29: qty 0.5

## 2014-11-29 MED ORDER — HYDROCODONE-ACETAMINOPHEN 5-325 MG PO TABS
1.0000 | ORAL_TABLET | Freq: Once | ORAL | Status: AC
Start: 2014-11-29 — End: 2014-11-29
  Administered 2014-11-29: 1 via ORAL
  Filled 2014-11-29: qty 1

## 2014-11-29 MED ORDER — FLUORESCEIN SODIUM 1 MG OP STRP
1.0000 | ORAL_STRIP | Freq: Once | OPHTHALMIC | Status: DC
  Filled 2014-11-29: qty 1

## 2014-11-29 MED ORDER — HYDROCODONE-ACETAMINOPHEN 5-325 MG PO TABS: 1.0000 | ORAL_TABLET | Freq: Four times a day (QID) | ORAL | Status: DC | PRN

## 2014-11-29 MED ORDER — MORPHINE SULFATE 4 MG/ML IJ SOLN
4.0000 mg | Freq: Once | INTRAMUSCULAR | Status: AC
  Administered 2014-11-29: 4 mg via INTRAVENOUS
  Filled 2014-11-29: qty 1

## 2014-11-29 MED ORDER — ERYTHROMYCIN 5 MG/GM OP OINT
TOPICAL_OINTMENT | Freq: Three times a day (TID) | OPHTHALMIC | Status: DC
  Administered 2014-11-29: 21:00:00 via OPHTHALMIC
  Filled 2014-11-29: qty 3.5

## 2014-11-29 MED ORDER — TETRACAINE HCL 0.5 % OP SOLN
2.0000 [drp] | Freq: Once | OPHTHALMIC | Status: DC
  Filled 2014-11-29: qty 2

## 2014-11-29 MED ORDER — LIDOCAINE-EPINEPHRINE (PF) 2 %-1:200000 IJ SOLN
10.0000 mL | Freq: Once | INTRAMUSCULAR | Status: AC
  Administered 2014-11-29: 10 mL
  Filled 2014-11-29: qty 20

## 2014-11-29 NOTE — ED Provider Notes (Signed)
CSN: [id number]     Arrival date & time 11/29/14  1621 History  This chart was scribed for non-physician practitioner working, Levi StraussMercedes Camprubi-Soms, PA-C, with Glynn OctaveStephen Rancour, MD, by Modena JanskyAlbert Thayil, ED Scribe. This patient was seen in room TR04C/TR04C and the patient's care was started at 5:21 PM.   Chief Complaint  Patient presents with  . Eye Pain   Patient is a 29 y.o. male presenting with eye pain. The history is provided by the patient.  Eye Pain This is a new problem. The current episode started 1 to 2 hours ago. The problem occurs rarely. The problem has not changed since onset.Pertinent negatives include no chest pain, no abdominal pain, no headaches and no shortness of breath. Exacerbated by: moving left eyelid. Nothing relieves the symptoms. He has tried nothing for the symptoms. The treatment provided no relief.   HPI Comments: Todd Shaw is a 29 y.o. male with a PMHx of bipolar disorder, who presents to the Emergency Department complaining of constant moderate left upper eyelid pain that started today. He reports that he was accidentally cut on his left upper eyelid by a fingernail. He states that he has pain to his left upper eyelid, not eyeball, with a laceration. He currently rates the severity of the pain as a 9/10, and describes the pain as a throbbing sensation which is nonradiating. He reports that left eyelid movement exacerbates the pain. He states that he had no treatment PTA. He denies any visual disturbance that's different than normal (wears glasses but doesn't have them, therefore he states his vision is blurry but no different than normal), eye drainage, FB sensation, fever, rhinorrhea, chest pain, SOB, abd pain, nausea, vomiting, HA, dizziness, or lightheadedness. He reports that his tetanus is out of date, unsure of last tetanus.   Past Medical History  Diagnosis Date  . Bipolar 1 disorder    History reviewed. No pertinent past surgical history. No family history on  file. History  Substance Use Topics  . Smoking status: Never Smoker   . Smokeless tobacco: Not on file  . Alcohol Use: No    Review of Systems  Constitutional: Negative for fever and chills.  HENT: Negative for ear discharge, ear pain, rhinorrhea and sore throat.   Eyes: Positive for pain (L upper eyelid, not to the eye directly). Negative for photophobia, discharge, redness and visual disturbance.  Respiratory: Negative for shortness of breath.   Cardiovascular: Negative for chest pain.  Gastrointestinal: Negative for nausea, vomiting and abdominal pain.  Skin: Positive for wound.  Allergic/Immunologic: Negative for immunocompromised state.  Neurological: Negative for dizziness, weakness, light-headedness, numbness and headaches.  Hematological: Does not bruise/bleed easily.  Psychiatric/Behavioral: Negative for confusion.  10 Systems reviewed and all are negative for acute change except as noted in the HPI.  Allergies  Review of patient's allergies indicates no known allergies.  Home Medications   Prior to Admission medications   Medication Sig Start Date End Date Taking? Authorizing Provider  cyclobenzaprine (FLEXERIL) 10 MG tablet Take 1 tablet (10 mg total) by mouth 2 (two) times daily as needed for muscle spasms. 05/22/14   Oswaldo ConroyVictoria Creech, PA-C   BP 135/77 mmHg  Pulse 95  Temp(Src) 98.3 F (36.8 C) (Oral)  Resp 18  SpO2 98% Physical Exam  Constitutional: He is oriented to person, place, and time. Vital signs are normal. He appears well-developed and well-nourished.  Non-toxic appearance. No distress.  Afebrile, nontoxic, NAD  HENT:  Head: Normocephalic. Head is with laceration. Head  is without right periorbital erythema and without left periorbital erythema.  Nose: Nose normal.  Mouth/Throat: Mucous membranes are normal.  Vertical laceration to L upper eyelid with extension through the lid margin and into the medial canthus disrupting the medial lid structures. Some  oozing ongoing, controlled with pressure. No surrounding erythema or warmth.   Eyes: Conjunctivae and EOM are normal. Pupils are equal, round, and reactive to light. Right eye exhibits no discharge. Left eye exhibits no discharge.    L upper eyelid lac as noted above, approx 4mm in length.  PERRL, EOMI, no nystagmus, no visual field deficits   Visual acuity: Bilateral Distance: 20/70 (Pt wears glases but does not have them) ; R Distance: 20/30 (Pt wears glasses but does not have them) ; L Distance: Pt un able to read chart with LT eye-- states this is chronic due to not having his glasses  Fluorescein staining performed by Dr. Dione Booze  Neck: Normal range of motion. Neck supple.  Cardiovascular: Normal rate.   Pulmonary/Chest: Effort normal. No respiratory distress.  Abdominal: Normal appearance. He exhibits no distension.  Musculoskeletal: Normal range of motion.  Neurological: He is alert and oriented to person, place, and time. He has normal strength. No sensory deficit.  Skin: Skin is warm and dry. Laceration noted. No rash noted.  Laceration of L upper eyelid as noted above. SEE PICTURE BELOW  Psychiatric: He has a normal mood and affect.  Nursing note and vitals reviewed.     ED Course  Procedures (including critical care time) DIAGNOSTIC STUDIES: Oxygen Saturation is 98% on RA, normal by my interpretation.    COORDINATION OF CARE: 5:25 PM- Pt advised of plan for treatment which includes medication and pt agrees.  Labs Review Labs Reviewed - No data to display  Imaging Review No results found.   EKG Interpretation None      MDM   Final diagnoses:  Eyelid laceration, canaliculus, left, initial encounter  Eyelid pain, left    29 y.o. male here with lac to medial canthus of L eyelid, through the lid margin. No eye pain or FB sensation per pt, he wears glasses normally but doesn't have them therefore couldn't perform visual acuity with L eye. Will give pain meds and  consult opthalmology given the severity and location of the laceration. Will update tetanus. Will order fluorescein staining materials but hold until ophthalmology returns page.  6:01 PM Dr. Dione Booze returning page, will come evaluate pt. Requests that I wait on fluorescein stain and he will perform it himself. Will monitor pt.   8:57 PM Dr. Dione Booze finishing repair now. Gave erythromycin ointment and instructions for wound care. Will have him f/up in his clinic on 5/31. Will give pain meds. Pt understands instructions as directed by Dr. Dione Booze. I explained the diagnosis and have given explicit precautions to return to the ER including for any other new or worsening symptoms. The patient understands and accepts the medical plan as it's been dictated and I have answered their questions. Discharge instructions concerning home care and prescriptions have been given. The patient is STABLE and is discharged to home in good condition.   I personally performed the services described in this documentation, which was scribed in my presence. The recorded information has been reviewed and is accurate.  BP 135/77 mmHg  Pulse 95  Temp(Src) 98.3 F (36.8 C) (Oral)  Resp 18  SpO2 98%  Meds ordered this encounter  Medications  . HYDROcodone-acetaminophen (NORCO/VICODIN) 5-325 MG per tablet 1  tablet    Sig:   . tetracaine (PONTOCAINE) 0.5 % ophthalmic solution 2 drop    Sig:   . fluorescein ophthalmic strip 1 strip    Sig:   . Tdap (BOOSTRIX) injection 0.5 mL    Sig:   . lidocaine-EPINEPHrine (XYLOCAINE W/EPI) 2 %-1:200000 (PF) injection 10 mL    Sig:   . morphine 4 MG/ML injection 4 mg    Sig:   . erythromycin ophthalmic ointment    Sig:   . HYDROcodone-acetaminophen (NORCO) 5-325 MG per tablet    Sig: Take 1 tablet by mouth every 6 (six) hours as needed for severe pain.    Dispense:  6 tablet    Refill:  0    Order Specific Question:  Supervising Provider    Answer:  Eber Hong [[id number]]        Jontrell Bushong Camprubi-Soms, PA-C 11/29/14 1610  Glynn Octave, MD 11/30/14 [phone]

## 2014-11-29 NOTE — ED Notes (Signed)
Gave pt Erythromycin ointment and instructed to use TID until seen in Dr. Dione BoozeGroat office 12-08-14.  Advised to call his office in am for appt.  Pt verbalized understanding.

## 2014-11-29 NOTE — ED Notes (Signed)
Dr. Dione BoozeGroat at bedside.

## 2014-11-29 NOTE — Discharge Instructions (Signed)
Follow the instructions given to you by Dr. Dione BoozeGroat. Use the erythromycin ointment as directed. Use motrin or norco as directed as needed for pain but don't drive while taking norco. Follow up with Dr. Dione BoozeGroat on 5/31 for recheck and suture removal. Return to the ER for changes or worsening symptoms.   Facial Laceration A facial laceration is a cut on the face. These injuries can be painful and cause bleeding. Some cuts may need to be closed with stitches (sutures), skin adhesive strips, or wound glue. Cuts usually heal quickly but can leave a scar. It can take 1-2 years for the scar to go away completely. HOME CARE   Only take medicines as told by your doctor.  Follow your doctor's instructions for wound care. For Stitches:  Keep the cut clean and dry.  If you have a bandage (dressing), change it at least once a day. Change the bandage if it gets wet or dirty, or as told by your doctor.  Wash the cut with soap and water 2 times a day. Rinse the cut with water. Pat it dry with a clean towel.  Put a thin layer of medicated cream on the cut as told by your doctor.  You may shower after the first 24 hours. Do not soak the cut in water until the stitches are removed.  Have your stitches removed as told by your doctor.  Do not wear any makeup until a few days after your stitches are removed. For Skin Adhesive Strips:  Keep the cut clean and dry.  Do not get the strips wet. You may take a bath, but be careful to keep the cut dry.  If the cut gets wet, pat it dry with a clean towel.  The strips will fall off on their own. Do not remove the strips that are still stuck to the cut. For Wound Glue:  You may shower or take baths. Do not soak or scrub the cut. Do not swim. Avoid heavy sweating until the glue falls off on its own. After a shower or bath, pat the cut dry with a clean towel.  Do not put medicine or makeup on your cut until the glue falls off.  If you have a bandage, do not put  tape over the glue.  Avoid lots of sunlight or tanning lamps until the glue falls off.  The glue will fall off on its own in 5-10 days. Do not pick at the glue. After Healing: Put sunscreen on the cut for the first year to reduce your scar. GET HELP RIGHT AWAY IF:   Your cut area gets red, painful, or puffy (swollen).  You see a yellowish-white fluid (pus) coming from the cut.  You have chills or a fever. MAKE SURE YOU:   Understand these instructions.  Will watch your condition.  Will get help right away if you are not doing well or get worse. Document Released: 12/13/2007 Document Revised: 04/16/2013 Document Reviewed: 02/06/2013 [facility] Patient Information 2015 [place], [place]. This information is not intended to replace advice given to you by your health care provider. Make sure you discuss any questions you have with your health care provider.  Stitches, Staples, or Skin Adhesive Strips  Stitches (sutures), staples, and skin adhesive strips hold the skin together as it heals. They will usually be in place for 7 days or less. HOME CARE  Wash your hands with soap and water before and after you touch your wound.  Only take medicine as told  by your doctor.  Cover your wound only if your doctor told you to. Otherwise, leave it open to air.  Do not get your stitches wet or dirty. If they get dirty, dab them gently with a clean washcloth. Wet the washcloth with soapy water. Do not rub. Pat them dry gently.  Do not put medicine or medicated cream on your stitches unless your doctor told you to.  Do not take out your own stitches or staples. Skin adhesive strips will fall off by themselves.  Do not pick at the wound. Picking can cause an infection.  Do not miss your follow-up appointment.  If you have problems or questions, call your doctor. GET HELP RIGHT AWAY IF:   You have a temperature by mouth above 102 F (38.9 C), not controlled by medicine.  You have  chills.  You have redness or pain around your stitches.  There is puffiness (swelling) around your stitches.  You notice fluid (drainage) from your stitches.  There is a bad smell coming from your wound. MAKE SURE YOU:  Understand these instructions.  Will watch your condition.  Will get help if you are not doing well or get worse. Document Released: 04/23/2009 Document Revised: 09/18/2011 Document Reviewed: 04/23/2009 [facility] Patient Information 2015 [place], [place]. This information is not intended to replace advice given to you by your health care provider. Make sure you discuss any questions you have with your health care provider.  Wound Care Wound care helps prevent pain and infection.  You may need a tetanus shot if:  You cannot remember when you had your last tetanus shot.  You have never had a tetanus shot.  The injury broke your skin. If you need a tetanus shot and you choose not to have one, you may get tetanus. Sickness from tetanus can be serious. HOME CARE   Only take medicine as told by your doctor.  Clean the wound daily with mild soap and water.  Change any bandages (dressings) as told by your doctor.  Put medicated cream and a bandage on the wound as told by your doctor.  Change the bandage if it gets wet, dirty, or starts to smell.  Take showers. Do not take baths, swim, or do anything that puts your wound under water.  Rest and raise (elevate) the wound until the pain and puffiness (swelling) are better.  Keep all doctor visits as told. GET HELP RIGHT AWAY IF:   Yellowish-white fluid (pus) comes from the wound.  Medicine does not lessen your pain.  There is a red streak going away from the wound.  You have a fever. MAKE SURE YOU:   Understand these instructions.  Will watch your condition.  Will get help right away if you are not doing well or get worse. Document Released: 04/04/2008 Document Revised: 09/18/2011 Document Reviewed:  10/30/2010 [facility] Patient Information 2015 [place], [place]. This information is not intended to replace advice given to you by your health care provider. Make sure you discuss any questions you have with your health care provider.

## 2014-11-29 NOTE — Procedures (Addendum)
SEE SCANNED NOTE  Consent obtained. LUL anesthetized with 4mL 2% lido with epi. Prepped with betadine then draped. Mini-monoka stent placed through LUL canaliculus and threaded into common canaliculus. Cut ends of canaliculus and medial canthal tendon re-approximated with 4-0 vicryl suture. A deep 6-0 vicryl was used to re-approximate deep tissues. Skin was closed with 6-0 chromic. Pt tolerated this well. Erythromycin ointment applied. Discharged in stable condition.  Marchelle Gearinghris Glyndon Tursi, MD 11/29/2014, 9:45 PM

## 2014-11-29 NOTE — ED Notes (Signed)
Pt presents to department for evaluation of L eye laceration. Pt was accidentally struck to L upper eyelid with fingernail, laceration noted upon arrival, bleeding controlled. Denies visual issues at present. NAD

## 2014-11-29 NOTE — Consult Note (Signed)
Ophthalmology Initial Consult Note  Chaudoin,Dashan, 29 y.o. male Date of Service:  11/29/14   Requesting physician: Allen DerryMercedes Camprubi-Soms, PA-C, with Glynn OctaveStephen Rancour, MD  Information Obtained from: patient Chief Complaint:  LUL laceration   HPI/Discussion:  Todd Shaw is a 29 y.o. male who sustained a LUL laceration today while "horse playing with a friend". He thinks it was caused by a finger or fingernail. Mild pain. Throbbing. Not improved by anything. Exacerbated by palpation. Vision is normal. No other associated symptoms.  Past Ocular Hx:  None Ocular Meds:  None Family ocular history: None  Past Medical History  Diagnosis Date  . Bipolar 1 disorder    History reviewed. No pertinent past surgical history.  Prior to Admission Meds:  Inpatient Meds:  No Known Allergies History  Substance Use Topics  . Smoking status: Never Smoker   . Smokeless tobacco: Not on file  . Alcohol Use: No    ROS: Other than ROS in the HPI, all other systems were negative.  Exam: Temp: 98.3 F (36.8 C) Pulse Rate: 95 BP: 135/77 mmHg Resp: 18 SpO2: 98 %  Visual Acuity:  [place]  cc  ph  near   OD  +   NI  20/25   OS  +   20/70  20/100     OD OS  Confr Vis Fields FTCF FTCF  EOM (Primary) Full Full  Lids/Lashes WNL LUL full thickness laceration through superior canaliculus, 8mm skin laceration  Conjunctiva - Bulbar White and quiet White and quiet  Conjunctiva - Palpebral               WNL WNL  Adnexa  WNL WNL  Pupils  Round Round  Reaction, Direct WNL WNL                 Consensual WNL WNL                 RAPD No No  Cornea  Clear Clear  Anterior Chamber Deep Deep  Lens:  Clear Clear  IOP 15 15  Fundus - Dilated? Yes   Optic Disc - C:D Ratio 0.4 0.4                     Appearance  WNL WNL                     NF Layer WNL WNL  Post Seg:  Retina                    Vessels Normal caliber Normal caliber                  Vitreous  Clear Clear                  Macula Flat Flat              Periphery Attached Attached       Neuro:  Oriented to person, place, and time:  Yes Psychiatric:  Mood and Affect Appropriate:  Yes  Labs/imaging: None  A/P: 29 y.o. male with LUL laceration through canaliculus - Globe intact - Consent obtained and repaired at bedside - See scanned note - Erythromycin ointment TID until f/u - [facility]  Marchelle Gearinghris Aceson Labell, MD 11/29/2014, 9:45 PM

## 2014-11-29 NOTE — ED Notes (Signed)
VO for IV and Morphine from Dr. Dione BoozeGroat.

## 2015-06-21 ENCOUNTER — Emergency Department (HOSPITAL_COMMUNITY): Payer: Self-pay

## 2015-06-21 ENCOUNTER — Encounter (HOSPITAL_COMMUNITY): Payer: Self-pay | Admitting: Family Medicine

## 2015-06-21 ENCOUNTER — Emergency Department (HOSPITAL_COMMUNITY)
Admission: EM | Admit: 2015-06-21 | Discharge: 2015-06-21 | Disposition: A | Discharge status: Home / Self Care | Payer: Self-pay | Attending: Emergency Medicine | Admitting: Emergency Medicine

## 2015-06-21 DIAGNOSIS — G5612 Other lesions of median nerve, left upper limb: Secondary | ICD-10-CM | POA: Insufficient documentation

## 2015-06-21 DIAGNOSIS — R0789 Other chest pain: Secondary | ICD-10-CM | POA: Insufficient documentation

## 2015-06-21 DIAGNOSIS — F17210 Nicotine dependence, cigarettes, uncomplicated: Secondary | ICD-10-CM | POA: Insufficient documentation

## 2015-06-21 DIAGNOSIS — Z8659 Personal history of other mental and behavioral disorders: Secondary | ICD-10-CM | POA: Insufficient documentation

## 2015-06-21 LAB — BASIC METABOLIC PANEL
Anion gap: 6 (ref 5–15)
BUN: 7 mg/dL (ref 6–20)
CHLORIDE: 106 mmol/L (ref 101–111)
CO2: 27 mmol/L (ref 22–32)
Calcium: 9.4 mg/dL (ref 8.9–10.3)
Creatinine, Ser: 0.78 mg/dL (ref 0.61–1.24)
GFR calc non Af Amer: >60 mL/min (ref >60)
Glucose, Bld: 107 mg/dL — ABNORMAL HIGH (ref 65–99)
POTASSIUM: 4.1 mmol/L (ref 3.5–5.1)
SODIUM: 139 mmol/L (ref 135–145)

## 2015-06-21 LAB — CBC
HEMATOCRIT: 44.4 % (ref 39.0–52.0)
HEMOGLOBIN: 14.4 g/dL (ref 13.0–17.0)
MCH: 30.1 pg (ref 26.0–34.0)
MCHC: 32.4 g/dL (ref 30.0–36.0)
MCV: 92.7 fL (ref 78.0–100.0)
Platelets: 281 10*3/uL (ref 150–400)
RBC: 4.79 MIL/uL (ref 4.22–5.81)
RDW: 13.3 % (ref 11.5–15.5)
WBC: 5 10*3/uL (ref 4.0–10.5)

## 2015-06-21 LAB — I-STAT TROPONIN, ED: Troponin i, poc: 0.01 ng/mL (ref 0.00–0.08)

## 2015-06-21 MED ORDER — METHOCARBAMOL 500 MG PO TABS
1000.0000 mg | ORAL_TABLET | Freq: Once | ORAL | Status: AC
  Administered 2015-06-21: 1000 mg via ORAL
  Filled 2015-06-21: qty 2

## 2015-06-21 MED ORDER — NAPROXEN 500 MG PO TABS: 500.0000 mg | ORAL_TABLET | Freq: Two times a day (BID) | ORAL | Status: DC

## 2015-06-21 MED ORDER — KETOROLAC TROMETHAMINE 30 MG/ML IJ SOLN
30.0000 mg | Freq: Once | INTRAMUSCULAR | Status: AC
Start: 2015-06-21 — End: 2015-06-21
  Administered 2015-06-21: 30 mg via INTRAMUSCULAR
  Filled 2015-06-21: qty 1

## 2015-06-21 MED ORDER — METHOCARBAMOL 500 MG PO TABS: 500.0000 mg | ORAL_TABLET | Freq: Two times a day (BID) | ORAL | Status: DC

## 2015-06-21 NOTE — ED Notes (Signed)
Pt comfortable with discharge and follow up instructions. Pt declines wheelchair, escorted to waiting area by this RN. Prescriptions x2. 

## 2015-06-21 NOTE — ED Notes (Signed)
Pt here for left sided chest pain since yesterday. sts pain with movement. sts also some numbness in left hand for months.

## 2015-06-21 NOTE — Discharge Instructions (Signed)
1. Medications: Robaxin is your muscle relaxer to ease chest pain - take this as needed - This can make you very drowsy - please do not drink or drive on this medication. Naproxen will help with your chest pain as well as with your left hand numbness - this is best taken with food to avoid an upset stomach 2. Treatment: rest, drink plenty of fluids, ice to the left wrist will help numbness and tingling of fingers; wrist brace at night if symptoms worsen. 3. Follow Up: Please follow up with your primary doctor in 3 days for discussion of your diagnoses and further evaluation after today's visit; if you do not have a primary care doctor use the resource guide provided to find one; Please return to the ER for any new or worsening symptoms, any additional concerns.    Emergency Department Resource Guide 1) Find a Doctor and Pay Out of Pocket Although you won't have to find out who is covered by your insurance plan, it is a good idea to ask around and get recommendations. You will then need to call the office and see if the doctor you have chosen will accept you as a new patient and what types of options they offer for patients who are self-pay. Some doctors offer discounts or will set up payment plans for their patients who do not have insurance, but you will need to ask so you aren't surprised when you get to your appointment.  2) Contact Your Local Health Department Not all health departments have doctors that can see patients for sick visits, but many do, so it is worth a call to see if yours does. If you don't know where your local health department is, you can check in your phone book. The CDC also has a tool to help you locate your state's health department, and many state websites also have listings of all of their local health departments.  3) Find a Walk-in Clinic If your illness is not likely to be very severe or complicated, you may want to try a walk in clinic. These are popping up all over the  country in pharmacies, drugstores, and shopping centers. They're usually staffed by nurse practitioners or physician assistants that have been trained to treat common illnesses and complaints. They're usually fairly quick and inexpensive. However, if you have serious medical issues or chronic medical problems, these are probably not your best option.  No Primary Care Doctor: - Call Health Connect at  [phone] - they can help you locate a primary care doctor that  accepts your insurance, provides certain services, etc. - Physician Referral Service- [phone]  Chronic Pain Problems: Organization         Address  Phone   Notes  [facility]  [phone] Patients need to be referred by their primary care doctor.   Medication Assistance: Organization         Address  Phone   Notes  [facility] Medication [place] [facility] [address] [place], [place] [id number] [phone] --Must be a resident of [place] [facility] -- Must have NO insurance coverage whatsoever (no Medicaid/ Medicare, etc.) -- The pt. MUST have a primary care doctor that directs their care regularly and follows them in the community   MedAssist  [phone]) [phone]   [employer]  [phone]) [phone]    Agencies that provide inexpensive medical care: Organization         Address  Phone   Notes  [facility]  [phone]   [facility]    [phone]   [facility] [place], [place] [address] [phone]   [place] [address] [place] [phone]   Planned Parenthood    [phone]   [facility]    [phone]   [place] and [place] [address], [place] Phone:  [phone], Fax:  [phone] Hours of Operation:  9 am - 6 pm, M-F.  Also accepts Medicaid/Medicare and self-pay.  [facility] for [place] [address], [address], [place] Phone: [phone], Fax: [phone]. Hours of Operation:  8:30 am - 5:30 pm, M-F.  Also accepts Medicaid and self-pay.  [facility] [address], [place] Phone: [phone]   [place], [place], [place] [phone], Ext. [phone] Mondays & Thursdays: 7-9 AM.  First 15 patients are seen on a first come, first serve basis.    [place] Providers:  Organization         Address  Phone   Notes  [facility] [address], [place] [phone] Also accepts self-pay patients.  [facility] [id number] [place], [place]  [phone]   [place], [address], [place] [phone]   [facility] [address] [place] [phone]   Lucianne Lei [address], [place]   [phone] Only accepts [place] Access [place] patients after they have their name applied to their card.   Self-Pay (no insurance) in [facility]:  Organization         Address  Phone   Notes  Sickle Cell Patients, [facility] [place] [phone]   [facility] [place] [phone]   [facility] [place]  [place], [place], [place] [phone]   [facility]/Dr. Osei-Bonsu  [address] [place] or [address], [place] [phone] Phone number for both [place] and [place] locations is the same.  [facility] [address] [place] [phone]   [facility] [address] [place] or [address] [phone]   [facility] [address], [place] [phone], phone; [phone], fax Sees patients 1st and 3rd Saturday of every month.  Must  not qualify for public or private insurance (i.e. Medicaid, Medicare, [place] Health Choice, Veterans' Benefits)  Household income should be no more than 200% of the poverty level The clinic cannot treat you if you are pregnant or think you are pregnant  Sexually transmitted diseases are not treated at the clinic.    Dental Care: Organization         Address  Phone  Notes  [facility] Department of [facility] [phone] Accepts children up to age 8 who are enrolled in [place] or [place]; pregnant women with a Medicaid card; and children who have applied for Medicaid or [place] Health Choice, but were declined, whose parents can pay a reduced fee at time of service.  [facility] Department of  [facility]  [address], [place] [phone] Accepts children up to age 66 who are enrolled in Medicaid or [place]; pregnant women with a Medicaid card; and children who have applied for Medicaid or [place] Health Choice, but were declined, whose parents can pay a reduced fee at time of service.  [place] Adult Dental Access PROGRAM  [place] [phone] Patients are seen by appointment only. Walk-ins are not accepted. [place] will see patients 31 years of age and older. Monday - Tuesday (8am-5pm) Most Wednesdays (8:30-5pm) $30 per visit, cash only  [facility] Adult Dental Access PROGRAM  [address], [facility] [phone] Patients are seen by appointment only. Walk-ins are not accepted. [place] will see patients 65 years of age and older. One Wednesday Evening (Monthly: Volunteer Based).  $30 per visit, cash only  [place]  [phone] for adults; Children under age 24, call Graduate Pediatric Dentistry at [phone]. Children aged 21-14, please call [phone] to request a pediatric application.  Dental services are  provided in all areas of dental care including fillings, crowns and bridges, complete and partial dentures, implants, gum treatment, root canals, and extractions. Preventive care is also provided. Treatment is provided to both adults and children. Patients are selected via a lottery and there is often a waiting list.   [facility] [address], [place]  [phone] [email or web address]   [facility] [address] [place], [place] [phone], Ext. [phone] Second and Fourth Thursday of each month, opens at 6:30 AM; Clinic ends at 9 AM.  Patients are seen on a first-come first-served basis, and a limited number are seen during each clinic.   [facility]  [address] Hillard Danker [place], [place] [phone]   Eligibility Requirements You must have lived in [place], [place], or [place] counties for at least the last three months.   You cannot be eligible for state or federal sponsored [employer], including [employer], [place], or [employer].   You generally cannot be eligible for healthcare insurance through your employer.    How to apply: Eligibility screenings are held every Tuesday and Wednesday afternoon from 1:00 pm until 4:00 pm. You do not need an appointment for the interview!  [facility] [address], [place], [place]   [place]  [place] Department  [place]  [phone]    Behavioral Health Resources in the Community: Intensive Outpatient Programs Organization         Address  Phone  Notes  [place] [facility] [address], [place], [place] [phone]   [facility] [address] [place], [place]   ADS: Alcohol & Drug Svcs [address] [place], [place]   [place] [address],  [place], [place] or [phone]   Substance Abuse Resources Organization         Address  Phone  Notes  Alcohol and Drug Services  [phone]   [place]  [phone]   [facility] [place]   Chinita Pester  [phone]   Residential & Outpatient Substance Abuse Program  [phone]   Psychological Services Organization         Address  Phone  Notes  [place]  [place]   [place] [address], [place] or [phone]    Mobile Crisis Teams Organization         Address  Phone  Notes  [facility], Mobile Crisis Care Unit  [phone]   [facility]  [address] [place], [place]   [place] Levels [address], [place] [phone]    Self-Help/Support Groups Organization         Address  Phone             Notes  [place]. of [place] - variety of support groups  [place] Call for more information  Narcotics Anonymous (NA), [facility]aring [facility] [address], [employer] [place]  2 meetings at this location   Special educational needs teacher         Address  Phone  Notes  [facility] Residential Treatment [place],    [place]  [phone]   [facility]  [address], [place] [phone], [place], [place]   [place], [place] [phone] Admissions: 8am-3pm M-F  Incentives Substance [place] [address]    [place], [place] [phone]   [facility] [address] [place], [place], [place]   [facility] [address],  [place], [place]   [facility] Programs - Intensive Outpatient [address], [place], [place]   [facility].,  [place], [place] [phone] or  [phone]   [facility] Services (RTS) [address] [place], [place] Accepts Medicaid  [facility] [place] [address]  [place] [phone] Substance Abuse/Addiction Treatment   [facility] Organization         Address  Phone  Notes  [facility]  [phone]   Domenic Schwab, PhD [address] Arlis [place], [place]   [phone] or [phone]   [place] [address] [place] [place], [place] [phone]   [facility] [address] [place], [place] [phone] Insurance/Medicaid/sponsorship through [facility] and Families [address]                                    [place], [place] [phone] [place] [address], [place] [phone]    Dr. Adele Schilder  [phone]   [facility] Dept. 1) [address], [place] 2) [place] 3)  [address], [place] [phone]  [phone]   [place] [phone] or [phone] (After Hours)

## 2015-06-21 NOTE — ED Provider Notes (Signed)
CSN: [id number]     Arrival date & time 06/21/15  [id number] History   First MD Initiated Contact with Patient 06/21/15 1340     Chief Complaint  Patient presents with  . Chest Pain  . Numbness     (Consider location/radiation/quality/duration/timing/severity/associated sxs/prior Treatment) The history is provided by the patient and medical records. No language interpreter was used.    Todd Shaw is a 29 y.o. male  with a PMH of bipolar d/o who presents to the Emergency Department complaining of left-sided sharp chest pain which is worse with movement and deep breathing that began Saturday (2 days ago). 0/10 pain when at rest, 9/10 when moving. No radiation of pain. No medications taken PTA. No history of similar sxs. Patient also complaining of left hand numbness/tingling of his first three digits - he enjoys playing the piano and keyboarding, stating symptoms are often worse after playing.    Past Medical History  Diagnosis Date  . Bipolar 1 disorder (HCC)    History reviewed. No pertinent past surgical history. History reviewed. No pertinent family history. Social History  Substance Use Topics  . Smoking status: Current Every Day Smoker    Types: Cigarettes  . Smokeless tobacco: None  . Alcohol Use: No    Review of Systems  Constitutional: Negative.   HENT: Negative for congestion, rhinorrhea and sore throat.   Eyes: Negative for visual disturbance.  Respiratory: Negative for cough, shortness of breath and wheezing.   Cardiovascular: Positive for chest pain. Negative for palpitations and leg swelling.  Gastrointestinal: Negative for nausea, vomiting, abdominal pain, diarrhea and constipation.  Endocrine: Negative for polydipsia and polyuria.  Musculoskeletal: Negative for back pain, neck pain and neck stiffness.  Skin: Negative for rash.  Neurological: Positive for numbness and headaches. Negative for dizziness and weakness.      Allergies  Review of patient's allergies  indicates no known allergies.  Home Medications   Prior to Admission medications   Medication Sig Start Date End Date Taking? Authorizing Provider  cyclobenzaprine (FLEXERIL) 10 MG tablet Take 1 tablet (10 mg total) by mouth 2 (two) times daily as needed for muscle spasms. Patient not taking: Reported on 06/21/2015 05/22/14   Oswaldo ConroyVictoria Creech, PA-C  HYDROcodone-acetaminophen [facility](NORCO) 5-325 MG per tablet Take 1 tablet by mouth every 6 (six) hours as needed for severe pain. Patient not taking: Reported on 06/21/2015 11/29/14   Mercedes Camprubi-Soms, PA-C  methocarbamol (ROBAXIN) 500 MG tablet Take 1 tablet (500 mg total) by mouth 2 (two) times daily. 06/21/15   Chase PicketJaime Pilcher Ward, PA-C  naproxen (NAPROSYN) 500 MG tablet Take 1 tablet (500 mg total) by mouth 2 (two) times daily. 06/21/15   Jaime Pilcher Ward, PA-C   BP 129/82 mmHg  Pulse 86  Temp(Src) 97.9 F (36.6 C) (Oral)  Resp 24  Wt 77.111 kg  SpO2 99% Physical Exam  Constitutional: He is oriented to person, place, and time. He appears well-developed and well-nourished.  Alert and in no acute distress  HENT:  Head: Normocephalic and atraumatic.  Cardiovascular: Normal rate, regular rhythm, normal heart sounds and intact distal pulses.  Exam reveals no gallop and no friction rub.   No murmur heard. Pulmonary/Chest: Effort normal and breath sounds normal. No respiratory distress. He has no wheezes. He has no rales. He exhibits no tenderness.  Abdominal: He exhibits no mass. There is no rebound and no guarding.  Abdomen soft, non-tender, non-distended Bowel sounds positive in all four quadrants  Musculoskeletal: He exhibits no edema.  Neurological: He is alert and oriented to person, place, and time.  Negative Tinel's Negative Phalen's  Decreased sensation in median nerve distribution L hand 5/5 muscle strength in bilateral UE's.   Skin: Skin is warm and dry. No rash noted.  Psychiatric: He has a normal mood and affect. His  behavior is normal. Judgment and thought content normal.  Nursing note and vitals reviewed.   ED Course  Procedures (including critical care time) Labs Review Labs Reviewed  BASIC METABOLIC PANEL - Abnormal; Notable for the following:    Glucose, Bld 107 (*)    All other components within normal limits  CBC  I-STAT TROPOININ, ED    Imaging Review Dg Chest 2 View  06/21/2015  CLINICAL DATA:  Chest pain EXAM: CHEST  2 VIEW COMPARISON:  10/17/2009 FINDINGS: The heart size and mediastinal contours are within normal limits. Both lungs are clear. The visualized skeletal structures are unremarkable. IMPRESSION: No active cardiopulmonary disease. Electronically Signed   By: Marlan Palau M.D.   On: 06/21/2015 10:59   I have personally reviewed and evaluated these images and lab results as part of my medical decision-making.   EKG Interpretation None      MDM   Final diagnoses:  Anterior chest wall pain  Median nerve dysfunction, left   Todd Shaw presents with left sided chest pain which is worse with movement and deep breathing. Given hx and HEART score of 0, trop of 0.01 cardiac etiology unlikely.   Labs: CBC, BMP, troponin - reassuring.   Imaging: CXR with no acute cardiopulm dz - pulmonary etiology unlikely.   Therapeutics: Robaxin and toradol for musculoskeletal pain relief.  3:09 PM - Patient reevaluated and feels much improved, stating he feels ready to go home.   A&P:  Chest wall pain   - Naproxen, robaxin for pain relief  - Return precautions, home care instructions, and follow up recommendations have been given Median nerve dysfunction  - Informed naproxen will help with these symptoms as well  - Ice wrist, decrease keyboard/piano playing until symptoms improve  - Wrist brace at night if symptoms worsen  Follow up with PCP, resource guide given.    [facility] Ward, PA-C 06/21/15 1519  Arby Barrette, MD 06/22/15 [phone]

## 2015-07-15 ENCOUNTER — Encounter (HOSPITAL_COMMUNITY): Payer: Self-pay | Admitting: *Deleted

## 2015-07-15 ENCOUNTER — Emergency Department (HOSPITAL_COMMUNITY)
Admission: EM | Admit: 2015-07-15 | Discharge: 2015-07-15 | Disposition: A | Discharge status: Home / Self Care | Payer: Self-pay | Attending: Emergency Medicine | Admitting: Emergency Medicine

## 2015-07-15 DIAGNOSIS — Z791 Long term (current) use of non-steroidal anti-inflammatories (NSAID): Secondary | ICD-10-CM | POA: Insufficient documentation

## 2015-07-15 DIAGNOSIS — F319 Bipolar disorder, unspecified: Secondary | ICD-10-CM | POA: Insufficient documentation

## 2015-07-15 DIAGNOSIS — F419 Anxiety disorder, unspecified: Secondary | ICD-10-CM | POA: Insufficient documentation

## 2015-07-15 DIAGNOSIS — Z79899 Other long term (current) drug therapy: Secondary | ICD-10-CM | POA: Insufficient documentation

## 2015-07-15 DIAGNOSIS — F17210 Nicotine dependence, cigarettes, uncomplicated: Secondary | ICD-10-CM | POA: Insufficient documentation

## 2015-07-15 MED ORDER — QUETIAPINE FUMARATE 300 MG PO TABS: 300.0000 mg | ORAL_TABLET | Freq: Every day | ORAL | Status: DC

## 2015-07-15 NOTE — ED Provider Notes (Signed)
CSN: [id number]     Arrival date & time 07/15/15  1326 History  By signing my name below, I, Todd Shaw, attest that this documentation has been prepared under the direction and in the presence of Roxy Horsemanobert Lenda Baratta, PA-C Electronically Signed: Charline BillsEssence Shaw, ED Scribe 07/15/2015 at 3:55 PM.  Chief Complaint  Patient presents with  . Medication Refill   The history is provided by the patient. No language interpreter was used.   HPI Comments: Todd Shaw is a 30 y.o. male, with a h/o bipolar disorder, who presents to the Emergency Department complaining of a medication refill of Seroquel. Pt states that he has been without his medications for over a month. He reports associated insomnia since being without his medication. Pt denies chest pain, abdominal pain and visual hallucinations.   Past Medical History  Diagnosis Date  . Bipolar 1 disorder (HCC)    History reviewed. No pertinent past surgical history. No family history on file. Social History  Substance Use Topics  . Smoking status: Current Every Day Smoker -- 1.00 packs/day    Types: Cigarettes  . Smokeless tobacco: None  . Alcohol Use: No    Review of Systems  Constitutional: Negative for fever and chills.  Respiratory: Negative for shortness of breath.   Cardiovascular: Negative for chest pain.  Gastrointestinal: Negative for nausea, vomiting, abdominal pain, diarrhea and constipation.  Genitourinary: Negative for dysuria.  Psychiatric/Behavioral: Negative for suicidal ideas and hallucinations. The patient is nervous/anxious.   All other systems reviewed and are negative.  Allergies  Review of patient's allergies indicates no known allergies.  Home Medications   Prior to Admission medications   Medication Sig Start Date End Date Taking? Authorizing Provider  cyclobenzaprine (FLEXERIL) 10 MG tablet Take 1 tablet (10 mg total) by mouth 2 (two) times daily as needed for muscle spasms. Patient not taking: Reported on  06/21/2015 05/22/14   Oswaldo ConroyVictoria Creech, PA-C  HYDROcodone-acetaminophen [facility](NORCO) 5-325 MG per tablet Take 1 tablet by mouth every 6 (six) hours as needed for severe pain. Patient not taking: Reported on 06/21/2015 11/29/14   Mercedes Camprubi-Soms, PA-C  methocarbamol (ROBAXIN) 500 MG tablet Take 1 tablet (500 mg total) by mouth 2 (two) times daily. 06/21/15   Chase PicketJaime Pilcher Ward, PA-C  naproxen (NAPROSYN) 500 MG tablet Take 1 tablet (500 mg total) by mouth 2 (two) times daily. 06/21/15   Jaime Pilcher Ward, PA-C   BP 157/96 mmHg  Pulse 100  Temp(Src) 98.2 F (36.8 C) (Oral)  Resp 18  Ht 5\' 6"  (1.676 m)  Wt 170 lb (77.111 kg)  BMI 27.45 kg/m2  SpO2 97% Physical Exam  Constitutional: He is oriented to person, place, and time. He appears well-developed and well-nourished. No distress.  HENT:  Head: Normocephalic and atraumatic.  Eyes: Conjunctivae and EOM are normal. Pupils are equal, round, and reactive to light. Right eye exhibits no discharge. Left eye exhibits no discharge. No scleral icterus.  Neck: Normal range of motion. Neck supple. No JVD present. No tracheal deviation present.  Cardiovascular: Normal rate, regular rhythm and normal heart sounds.  Exam reveals no gallop and no friction rub.   No murmur heard. Pulmonary/Chest: Effort normal and breath sounds normal. No respiratory distress. He has no wheezes. He has no rales. He exhibits no tenderness.  Abdominal: Soft. He exhibits no distension and no mass. There is no tenderness. There is no rebound and no guarding.  Musculoskeletal: Normal range of motion. He exhibits no edema or tenderness.  Neurological: He is  alert and oriented to person, place, and time.  Skin: Skin is warm and dry.  Psychiatric: He has a normal mood and affect. His behavior is normal. Judgment and thought content normal.  Nursing note and vitals reviewed.  ED Course  Procedures (including critical care time) DIAGNOSTIC STUDIES: Oxygen Saturation is 97% on  RA, normal by my interpretation.    COORDINATION OF CARE: 3:18 PM-Discussed treatment plan with pt at bedside and pt agreed to plan.     MDM   Final diagnoses:  Anxiety    Patient here requesting a refill of his Seroquel. He states that he has been out of it for several months. I verified this with the last pharmacy record of that he has, the last time he had it filled was at the end of July 2016. He takes 300 mg daily at bedtime. I will refill this for him for 2 weeks. I have instructed the patient follow-up with [name]. Patient understands and agrees with the plan. He will contact [name] in the next couple of days. He denies any other symptoms at this time. He is stable, well-appearing, not in any percent distress. He can be discharged home.  I personally performed the services described in this documentation, which was scribed in my presence. The recorded information has been reviewed and is accurate.      Roxy Horseman, PA-C 07/15/15 1607  Lavera Guise, MD 07/16/15 [phone]

## 2015-07-15 NOTE — Discharge Instructions (Signed)
Generalized Anxiety Disorder Generalized anxiety disorder (GAD) is a mental disorder. It interferes with life functions, including relationships, work, and school. GAD is different from normal anxiety, which everyone experiences at some point in their lives in response to specific life events and activities. Normal anxiety actually helps us prepare for and get through these life events and activities. Normal anxiety goes away after the event or activity is over.  GAD causes anxiety that is not necessarily related to specific events or activities. It also causes excess anxiety in proportion to specific events or activities. The anxiety associated with GAD is also difficult to control. GAD can vary from mild to severe. People with severe GAD can have intense waves of anxiety with physical symptoms (panic attacks).  SYMPTOMS The anxiety and worry associated with GAD are difficult to control. This anxiety and worry are related to many life events and activities and also occur more days than not for 6 months or longer. People with GAD also have three or more of the following symptoms (one or more in children):  Restlessness.   Fatigue.  Difficulty concentrating.   Irritability.  Muscle tension.  Difficulty sleeping or unsatisfying sleep. DIAGNOSIS GAD is diagnosed through an assessment by your health care provider. Your health care provider will ask you questions aboutyour mood,physical symptoms, and events in your life. Your health care provider may ask you about your medical history and use of alcohol or drugs, including prescription medicines. Your health care provider may also do a physical exam and blood tests. Certain medical conditions and the use of certain substances can cause symptoms similar to those associated with GAD. Your health care provider may refer you to a mental health specialist for further evaluation. TREATMENT The following therapies are usually used to treat GAD:    Medication. Antidepressant medication usually is prescribed for long-term daily control. Antianxiety medicines may be added in severe cases, especially when panic attacks occur.   Talk therapy (psychotherapy). Certain types of talk therapy can be helpful in treating GAD by providing support, education, and guidance. A form of talk therapy called cognitive behavioral therapy can teach you healthy ways to think about and react to daily life events and activities.  Stress managementtechniques. These include yoga, meditation, and exercise and can be very helpful when they are practiced regularly. A mental health specialist can help determine which treatment is best for you. Some people see improvement with one therapy. However, other people require a combination of therapies.   This information is not intended to replace advice given to you by your health care provider. Make sure you discuss any questions you have with your health care provider.   Document Released: 10/21/2012 Document Revised: 07/17/2014 Document Reviewed: 10/21/2012 Elsevier Interactive Patient Education 2016 [employer] Inc.  

## 2015-07-15 NOTE — ED Notes (Signed)
Pt states he has been out of his bipolar medication x 1 month and now he is starting to see things other people don't see (he can't explain).  He denies SI and HI.

## 2015-09-13 ENCOUNTER — Encounter (HOSPITAL_COMMUNITY): Payer: Self-pay

## 2015-09-13 ENCOUNTER — Inpatient Hospital Stay (HOSPITAL_COMMUNITY)
Admission: AD | Admit: 2015-09-13 | Discharge: 2015-09-17 | DRG: 885 | Disposition: A | Discharge status: Home / Self Care | Payer: Federal, State, Local not specified - Other | Attending: Psychiatry | Admitting: Psychiatry

## 2015-09-13 DIAGNOSIS — F329 Major depressive disorder, single episode, unspecified: Secondary | ICD-10-CM | POA: Clinically undetermined

## 2015-09-13 DIAGNOSIS — F32A Depression, unspecified: Secondary | ICD-10-CM | POA: Clinically undetermined

## 2015-09-13 DIAGNOSIS — F17210 Nicotine dependence, cigarettes, uncomplicated: Secondary | ICD-10-CM | POA: Diagnosis present

## 2015-09-13 DIAGNOSIS — R45851 Suicidal ideations: Secondary | ICD-10-CM | POA: Diagnosis present

## 2015-09-13 DIAGNOSIS — F1221 Cannabis dependence, in remission: Secondary | ICD-10-CM | POA: Diagnosis present

## 2015-09-13 DIAGNOSIS — F17200 Nicotine dependence, unspecified, uncomplicated: Secondary | ICD-10-CM

## 2015-09-13 DIAGNOSIS — F1290 Cannabis use, unspecified, uncomplicated: Secondary | ICD-10-CM | POA: Diagnosis not present

## 2015-09-13 DIAGNOSIS — F319 Bipolar disorder, unspecified: Secondary | ICD-10-CM | POA: Diagnosis present

## 2015-09-13 DIAGNOSIS — G4700 Insomnia, unspecified: Secondary | ICD-10-CM | POA: Diagnosis present

## 2015-09-13 DIAGNOSIS — F333 Major depressive disorder, recurrent, severe with psychotic symptoms: Principal | ICD-10-CM | POA: Diagnosis present

## 2015-09-13 DIAGNOSIS — F909 Attention-deficit hyperactivity disorder, unspecified type: Secondary | ICD-10-CM | POA: Diagnosis present

## 2015-09-13 LAB — COMPREHENSIVE METABOLIC PANEL
ALT: 18 U/L (ref 17–63)
AST: 30 U/L (ref 15–41)
Albumin: 4.6 g/dL (ref 3.5–5.0)
Alkaline Phosphatase: 57 U/L (ref 38–126)
Anion gap: 10 (ref 5–15)
BILIRUBIN TOTAL: 0.4 mg/dL (ref 0.3–1.2)
BUN: 12 mg/dL (ref 6–20)
CO2: 24 mmol/L (ref 22–32)
CREATININE: 1.03 mg/dL (ref 0.61–1.24)
Calcium: 9.5 mg/dL (ref 8.9–10.3)
Chloride: 106 mmol/L (ref 101–111)
GFR calc Af Amer: >60 mL/min (ref >60)
GLUCOSE: 125 mg/dL — AB (ref 65–99)
Potassium: 3.8 mmol/L (ref 3.5–5.1)
Sodium: 140 mmol/L (ref 135–145)
TOTAL PROTEIN: 7.5 g/dL (ref 6.5–8.1)

## 2015-09-13 LAB — CBC
HEMATOCRIT: 47.6 % (ref 39.0–52.0)
HEMOGLOBIN: 15.8 g/dL (ref 13.0–17.0)
MCH: 30.7 pg (ref 26.0–34.0)
MCHC: 33.2 g/dL (ref 30.0–36.0)
MCV: 92.4 fL (ref 78.0–100.0)
Platelets: 281 10*3/uL (ref 150–400)
RBC: 5.15 MIL/uL (ref 4.22–5.81)
RDW: 13.4 % (ref 11.5–15.5)
WBC: 5.3 10*3/uL (ref 4.0–10.5)

## 2015-09-13 LAB — RAPID URINE DRUG SCREEN, HOSP PERFORMED
Amphetamines: NOT DETECTED
BARBITURATES: NOT DETECTED
BENZODIAZEPINES: NOT DETECTED
Cocaine: NOT DETECTED
Opiates: NOT DETECTED
TETRAHYDROCANNABINOL: NOT DETECTED

## 2015-09-13 LAB — TSH: TSH: 2.091 u[IU]/mL (ref 0.350–4.500)

## 2015-09-13 LAB — ETHANOL

## 2015-09-13 MED ORDER — SERTRALINE HCL 25 MG PO TABS: 25.0000 mg | ORAL_TABLET | Freq: Every day | ORAL | Status: DC

## 2015-09-13 MED ORDER — ACETAMINOPHEN 325 MG PO TABS: 650.0000 mg | ORAL_TABLET | Freq: Four times a day (QID) | ORAL | Status: DC | PRN

## 2015-09-13 MED ORDER — BUPROPION HCL 75 MG PO TABS
75.0000 mg | ORAL_TABLET | Freq: Every day | ORAL | Status: DC
  Administered 2015-09-13 – 2015-09-14 (×2): 75 mg via ORAL
  Filled 2015-09-13 (×5): qty 1

## 2015-09-13 MED ORDER — MAGNESIUM HYDROXIDE 400 MG/5ML PO SUSP: 30.0000 mL | Freq: Every day | ORAL | Status: DC | PRN

## 2015-09-13 MED ORDER — HYDROXYZINE HCL 25 MG PO TABS
25.0000 mg | ORAL_TABLET | Freq: Three times a day (TID) | ORAL | Status: DC | PRN
  Administered 2015-09-13 – 2015-09-15 (×2): 25 mg via ORAL
  Filled 2015-09-13: qty 10
  Filled 2015-09-13 (×2): qty 1

## 2015-09-13 MED ORDER — TRAZODONE HCL 50 MG PO TABS
50.0000 mg | ORAL_TABLET | Freq: Every evening | ORAL | Status: DC | PRN
  Administered 2015-09-13 – 2015-09-16 (×4): 50 mg via ORAL
  Filled 2015-09-13: qty 1
  Filled 2015-09-13: qty 7
  Filled 2015-09-13 (×2): qty 1

## 2015-09-13 MED ORDER — OLANZAPINE 5 MG PO TBDP: 5.0000 mg | ORAL_TABLET | Freq: Three times a day (TID) | ORAL | Status: DC | PRN

## 2015-09-13 MED ORDER — HALOPERIDOL 5 MG PO TABS
5.0000 mg | ORAL_TABLET | ORAL | Status: DC
  Administered 2015-09-13 – 2015-09-17 (×8): 5 mg via ORAL
  Filled 2015-09-13 (×2): qty 1
  Filled 2015-09-13: qty 14
  Filled 2015-09-13 (×4): qty 1
  Filled 2015-09-13: qty 14
  Filled 2015-09-13 (×4): qty 1

## 2015-09-13 MED ORDER — BENZTROPINE MESYLATE 0.5 MG PO TABS
0.5000 mg | ORAL_TABLET | ORAL | Status: DC
  Administered 2015-09-13 – 2015-09-17 (×8): 0.5 mg via ORAL
  Filled 2015-09-13 (×5): qty 1
  Filled 2015-09-13 (×2): qty 14
  Filled 2015-09-13 (×5): qty 1

## 2015-09-13 MED ORDER — ALUM & MAG HYDROXIDE-SIMETH 200-200-20 MG/5ML PO SUSP: 30.0000 mL | ORAL | Status: DC | PRN

## 2015-09-13 MED ORDER — LORAZEPAM 1 MG PO TABS
1.0000 mg | ORAL_TABLET | ORAL | Status: DC | PRN
  Administered 2015-09-16 (×2): 1 mg via ORAL
  Filled 2015-09-13 (×2): qty 1

## 2015-09-13 MED ORDER — NICOTINE POLACRILEX 2 MG MT GUM
2.0000 mg | CHEWING_GUM | OROMUCOSAL | Status: DC | PRN
  Administered 2015-09-17: 2 mg via ORAL

## 2015-09-13 NOTE — [facility] Suicide Risk Assessment (Signed)
[facility]   Nursing information obtained from:    Demographic factors:    Current Mental Status:    Loss Factors:    Historical Factors:    Risk Reduction Factors:     Total Time spent with patient: 30 minutes Principal Problem: Depressive disorder Diagnosis:   Patient Active Problem List   Diagnosis Date Noted  . Depressive disorder [F32.9] 09/13/2015  . Attention deficit hyperactivity disorder (ADHD) [F90.9] 09/13/2015  . Cannabis use disorder, moderate, in sustained remission [F12.90] 09/13/2015  . Tobacco use disorder [F17.200] 09/13/2015   Subjective Data: Please see H&P.   Continued Clinical Symptoms:  Alcohol Use Disorder Identification Test Final Score (AUDIT): 0 The "Alcohol Use Disorders Identification Test", Guidelines for Use in Primary Care, Second Edition.  World Science writer[employer] Organization [facility](WHO). Score between 0-7:  no or low risk or alcohol related problems. Score between 8-15:  moderate risk of alcohol related problems. Score between 16-19:  high risk of alcohol related problems. Score 20 or above:  warrants further diagnostic evaluation for alcohol dependence and treatment.   CLINICAL FACTORS:   Depression:   Anhedonia Hopelessness Impulsivity More than one psychiatric diagnosis Previous Psychiatric Diagnoses and Treatments   Musculoskeletal: Strength & Muscle Tone: within normal limits Gait & Station: normal Patient leans: N/A  Psychiatric Specialty Exam: Review of Systems  Constitutional: Positive for malaise/fatigue.  Psychiatric/Behavioral: Positive for depression and hallucinations. The patient is nervous/anxious.   All other systems reviewed and are negative.   Blood pressure 128/75, pulse 102, temperature 98.4 F (36.9 C), temperature source Oral, resp. rate 16, height 5\' 7"  (1.702 m), weight 79.379 kg (175 lb).Body mass index is 27.4 kg/(m^2).                      Please see H&P.                                    COGNITIVE FEATURES THAT CONTRIBUTE TO RISK:  Closed-mindedness, Polarized thinking and Thought constriction (tunnel vision)    SUICIDE RISK:   Moderate:  Frequent suicidal ideation with limited intensity, and duration, some specificity in terms of plans, no associated intent, good self-control, limited dysphoria/symptomatology, some risk factors present, and identifiable protective factors, including available and accessible social support.  PLAN OF CARE: Please see H&P.   I certify that inpatient services furnished can reasonably be expected to improve the patient's condition.   Ahmaud Duthie, MD 09/13/2015, 2:13 PM

## 2015-09-13 NOTE — Tx Team (Signed)
Initial Interdisciplinary Treatment Plan   PATIENT STRESSORS: Health problems Medication change or noncompliance Occupational concerns   PATIENT STRENGTHS: Ability for insight Average or above average intelligence Communication skills Supportive family/friends   PROBLEM LIST: Problem List/Patient Goals Date to be addressed Date deferred Reason deferred Estimated date of resolution  "Focus" 09/13/15     "Get back on medications" 09/13/15     Psychosis 09/13/15     Anxiety 09/13/15     Depression 09/13/15     Medication Noncompliance 09/13/15                        DISCHARGE CRITERIA:  Ability to meet basic life and health needs Adequate post-discharge living arrangements Motivation to continue treatment in a less acute level of care Verbal commitment to aftercare and medication compliance  PRELIMINARY DISCHARGE PLAN: Outpatient therapy Return to previous living arrangement Return to previous work or school arrangements  PATIENT/FAMIILY INVOLVEMENT: This treatment plan has been presented to and reviewed with the patient, Todd Shaw.  The patient and family have been given the opportunity to ask questions and make suggestions.  Mickie Baillizabeth O Iwenekha 09/13/2015, 1:27 PM

## 2015-09-13 NOTE — [facility] Group Notes (Signed)
[facility] LCSW Group Therapy  09/13/2015 1:15 pm  Type of Therapy: Process Group Therapy  Participation Level:  Active  Participation Quality:  Appropriate  Affect:  Flat  Cognitive:  Oriented  Insight:  Improving  Engagement in Group:  Limited  Engagement in Therapy:  Limited  Modes of Intervention:  Activity, Clarification, Education, Problem-solving and Support  Summary of Progress/Problems: Today's group addressed the issue of overcoming obstacles.  Patients were asked to identify their biggest obstacle post d/c that stands in the way of their on-going success, and then problem solve as to how to manage this. Invited.  Chose to not attend.  Ida Rogueorth, Simrat Kendrick B 09/13/2015   4:43 PM

## 2015-09-13 NOTE — Progress Notes (Signed)
Patient ID: Todd Shaw, male   DOB: July 08, 1986, 30 y.o.   MRN: [id number] D: Patient cooperative denies SI/HI/AVH and pain. Pt reports he stopped taking his medication about a month ago. Pt c/o anxiety. Pt observed watching TV and interacting with peers.  No behavior issues noted.  A: Support and encouragement offered as needed. Medication administered as prescribed.  R: Patient remains safe and complaint with medications. Will continue to monitor for safety and stability

## 2015-09-13 NOTE — Plan of Care (Signed)
Problem: Alteration in mood Goal: STG-Patient reports thoughts of self-harm to staff Outcome: Progressing Pt c/o anxiety but denies SI/HI/AVH

## 2015-09-13 NOTE — H&P (Signed)
Psychiatric Admission Assessment Adult  Patient Identification: Todd Shaw MRN:  [id number] Date of Evaluation:  09/13/2015 Chief Complaint: Patient states " I am depressed.'   Principal Diagnosis: Depressive disorder R/O MDD versus Schizoaffective disorder  Diagnosis:   Patient Active Problem List   Diagnosis Date Noted  . Depressive disorder [F32.9] 09/13/2015  . Attention deficit hyperactivity disorder (ADHD) [F90.9] 09/13/2015  . Cannabis use disorder, moderate, in sustained remission [F12.90] 09/13/2015  . Tobacco use disorder [F17.200] 09/13/2015      History of Present Illness::Todd Shaw is a 30 y.o. AA male, single , employed at a [employer] , lives [place], who has a past hx of psychosis ( ? Substance induced)  who presented as a walk-in to [facility] for psychosis.  Per initial notes in EHR "  He has a standing diagnosis of Bipolar I and is prescribed Seroquel (per previous report). Pt reported that he is not compliant with meds and is experiencing auditory and visual hallucinations. Pt was dressed in street clothes and appearance was fairly well-groomed. Pt's mood was despondent/depressed, and affect was blunted. Pt had poor eye contact -- he was curled over. Demeanor was mild. Pt was vague on SI -- "I don't know"; was silent was asked about previous suicidal ideation or previous suicide attempts. Denied HI or self-harm. Memory and concentration were poor. Insight and judgment were fair. Pt denied substance use (no labs at this writing). Pt endorsed auditory and visual hallucinations related to God. When asked, he repeated several times, "I don't know how to explain it" and then became silent.Pt stated that he is prescribed [facility] but does not take it "because I feel like I was thrown on it." Pt was silent during much of assessment, curled over with no eye contact. He stated that he is experiencing auditory/visual  hallucinations related to God, but could not provide further details.'   Patient seen and chart reviewed today .Discussed patient with treatment team.  Pt today seen as lethargic , avoids eye contact, kept his head down on writer's desk the entire time he was evaluated. Pt answered all questions , mostly in monosyllables. Pt reports feeling depressed since the past few days and reports it is getting worse. Pt reports that his AH is getting worse - "Like  TV being switched on," but is not able to elaborate on what they say. Pt reports VH - but cannot elaborate. Pt denies paranoia/delusions. Pt reports low energy , insomnia , sadness almost everyday since the past several days , and SI , denies plan. Pt reports inability to focus , reports a hx of ADHD in the past. Pt reports he was supposed to follow up with [name], but is not compliant with treatment. Eventhough notes above states that pt has a hx of Bipolar do , pt denied any bipolar do sx with Clinical research associatewriter . Pt reports past hx of being [facility] - but does not know his past diagnosis or treatment hx. Pt does reports smoking cannabis in the past , but denies it at this time , has been sober for a long time. Pt denies abusing any other drugs and uses alcohol only socially.    Associated Signs/Symptoms: Depression Symptoms:  depressed mood, anhedonia, insomnia, psychomotor retardation, fatigue, feelings of worthlessness/guilt, difficulty concentrating, hopelessness, suicidal thoughts without plan, disturbed sleep, (Hypo) Manic Symptoms:  Hallucinations, Impulsivity, Anxiety Symptoms:  denies Psychotic Symptoms:  Hallucinations: Auditory Visual PTSD Symptoms: Negative Total Time spent with  patient: 45 minutes  Past Psychiatric History: Pt was admitted on 09/11/2008 - diagnosis of substance induced psychosis ( cough pill , cannabis ) . Pt follows up with [name]. Pt at that time responded well to Zyprexa.Pt denies past hx of suicide  attempts.  Is the patient at risk to self? Yes.    Has the patient been a risk to self in the past 6 months? Yes.    Has the patient been a risk to self within the distant past? Yes.    Is the patient a risk to others? Yes.   yes he is psychotic  Has the patient been a risk to others in the past 6 months? Yes.    Has the patient been a risk to others within the distant past? Yes.     Prior Inpatient Therapy: Prior Inpatient Therapy: Yes Prior Therapy Dates: UNK Prior Outpatient Therapy: Prior Outpatient Therapy: Yes Prior Therapy Facilty/Provider(s): [name] Reason for Treatment: Bipolar I Does patient have an ACCT team?: No Does patient have Intensive In-House Services?  : No Does patient have [name] services? : Yes Does patient have P4CC services?: No  Alcohol Screening: 1. How often do you have a drink containing alcohol?: Never 9. Have you or someone else been injured as a result of your drinking?: No 10. Has a relative or friend or a doctor or another health worker been concerned about your drinking or suggested you cut down?: No Alcohol Use Disorder Identification Test Final Score (AUDIT): 0 Brief Intervention: AUDIT score less than 7 or less-screening does not suggest unhealthy drinking-brief intervention not indicated Substance Abuse History in the last 12 months:  Yes.  cannabis in the past - reports he does not abuse it anymore , unknown when was last use Consequences of Substance Abuse: Negative Previous Psychotropic Medications: Yes  Psychological Evaluations: No  Past Medical History:  Past Medical History  Diagnosis Date  . Bipolar 1 disorder (HCC)    History reviewed. No pertinent past surgical history. Family History: Pt denies hx of HTN, seizures, cardiac disease, thyroid do in family. Family History  Problem Relation Age of Onset  . Mental illness Neg Hx    Family Psychiatric  History: Pt denies hx of mental illness, substance abuse in family Tobacco  Screening: smokes cigarettes , offered patch Social History: Pt is single , employed at a [employer] broadcast [employer] , lives with parents in [place] , denies hx of legal issues. History  Alcohol Use No     History  Drug Use No    Additional Social History: Marital status: Single    Pain Medications: See PTA Prescriptions: See PTA -- Pt endorsed Seroquel prescription but said he does not take it Over the Counter: See PTA History of alcohol / drug use?: No history of alcohol / drug abuse                    Allergies:  No Known Allergies Lab Results: No results found for this or any previous visit (from the past 48 hour(s)).  Blood Alcohol level:  Lab Results  Component Value Date   [facility]  09/11/2008    <5        LOWEST DETECTABLE LIMIT FOR SERUM ALCOHOL IS 5 mg/dL FOR MEDICAL PURPOSES ONLY    Metabolic Disorder Labs:  No results found for: HGBA1C, MPG No results found for: PROLACTIN No results found for: CHOL, TRIG, HDL, CHOLHDL, VLDL, LDLCALC  Current Medications: Current Facility-Administered Medications  Medication Dose Route  Frequency Provider Last Rate Last Dose  . acetaminophen (TYLENOL) tablet 650 mg  650 mg Oral Q6H PRN Thermon Leyland, NP      . alum & mag hydroxide-simeth (MAALOX/MYLANTA) 200-200-20 MG/5ML suspension 30 mL  30 mL Oral Q4H PRN Thermon Leyland, NP      . benztropine (COGENTIN) tablet 0.5 mg  0.5 mg Oral BH-qamhs Ricke Kimoto, MD      . buPROPion (WELLBUTRIN) tablet 75 mg  75 mg Oral Daily Elsie Sakuma, MD      . haloperidol (HALDOL) tablet 5 mg  5 mg Oral BH-qamhs Damonie Furney, MD      . hydrOXYzine (ATARAX/VISTARIL) tablet 25 mg  25 mg Oral TID PRN Jomarie Longs, MD      . LORazepam (ATIVAN) tablet 1 mg  1 mg Oral Q4H PRN Jomarie Longs, MD      . magnesium hydroxide (MILK OF MAGNESIA) suspension 30 mL  30 mL Oral Daily PRN Thermon Leyland, NP      . nicotine polacrilex (NICORETTE) gum 2 mg  2 mg Oral PRN Jomarie Longs, MD      . OLANZapine zydis (ZYPREXA)  disintegrating tablet 5 mg  5 mg Oral Q8H PRN Thermon Leyland, NP      . traZODone (DESYREL) tablet 50 mg  50 mg Oral QHS PRN Thermon Leyland, NP       PTA Medications: Prescriptions prior to admission  Medication Sig Dispense Refill Last Dose  . guaiFENesin (ROBITUSSIN) 100 MG/5ML liquid Take 200 mg by mouth 3 (three) times daily as needed for cough.   Past Week at Unknown time  . QUEtiapine (SEROQUEL) 300 MG tablet Take 1 tablet (300 mg total) by mouth at bedtime. 14 tablet 0     Musculoskeletal: Strength & Muscle Tone: within normal limits Gait & Station: normal Patient leans: Front  Psychiatric Specialty Exam: Physical Exam  Nursing note and vitals reviewed. Constitutional: He is oriented to person, place, and time. He appears well-developed and well-nourished.  HENT:  Head: Normocephalic and atraumatic.  Right Ear: External ear normal.  Left Ear: External ear normal.  Eyes: Conjunctivae and EOM are normal.  Neck: Normal range of motion. Neck supple. No thyromegaly present.  Cardiovascular: Normal rate, regular rhythm and normal heart sounds.   Respiratory: Effort normal and breath sounds normal.  GI: Soft.  Genitourinary:  Denies any sx in this area  Musculoskeletal: Normal range of motion.  Neurological: He is alert and oriented to person, place, and time.  Skin: Skin is warm and dry.  Psychiatric: His speech is normal. He is withdrawn and actively hallucinating. Cognition and memory are normal. He expresses impulsivity. He exhibits a depressed mood. He expresses suicidal ideation. He is inattentive.    Review of Systems  Constitutional: Positive for malaise/fatigue.  Psychiatric/Behavioral: Positive for depression, suicidal ideas and hallucinations. The patient is nervous/anxious and has insomnia.   All other systems reviewed and are negative.   Blood pressure 128/75, pulse 102, temperature 98.4 F (36.9 C), temperature source Oral, resp. rate 16, height 5\' 7"  (1.702 m),  weight 79.379 kg (175 lb).Body mass index is 27.4 kg/(m^2).  General Appearance: Disheveled  Eye Solicitor::  None  Speech:  Slow  Volume:  Decreased  Mood:  Depressed  Affect:  Depressed  Thought Process:  Irrelevant  Orientation:  Full (Time, Place, and Person)  Thought Content:  Hallucinations: Auditory Visual  Suicidal Thoughts:  Yes.  without intent/plan  Homicidal Thoughts:  No  Memory:  Immediate;   Fair Recent;   Fair Remote;   Fair  Judgement:  Impaired  Insight:  Shallow  Psychomotor Activity:  Decreased  Concentration:  Poor  Recall:  [employer] of Knowledge:Fair  Language: Fair  Akathisia:  No  Handed:  Right  AIMS (if indicated):     Assets:  Desire for Improvement  ADL's:  Intact  Cognition: WNL  Sleep:        Treatment Plan Summary:Todd Shaw is a 30 y.o. AA male, single , employed at a [employer] , lives with parents in [place], who has a past hx of psychosis ( ? Substance induced)  who presented as a walk-in to [facility] for psychosis. Pt today with depressive sx , appears lethargic , depressed. Will start treatment .  Daily contact with patient to assess and evaluate symptoms and progress in treatment and Medication management   Patient will benefit from inpatient treatment and stabilization.  Estimated length of stay is 5-7 days.  Reviewed past medical records,treatment plan.  Will start trial of Haldol 5 mg po bid for psychosis. Will add Cogentin 0.5 mg po bid for EPS. Will add Wellbutrin 75 mg po daily for affective sx. Will add Trazodone 50 mg po qhs prn for sleep. Will make available PRN medications as per agitation protocol. Will continue to monitor vitals ,medication compliance and treatment side effects while patient is here.  Will monitor for medical issues as well as call consult as needed.  Will order all routine labs including cbc, cmp, tsh, lipid panel, hba1c, BAL,UDS, EKG for qtc. CSW will start working on disposition.   Patient to participate in therapeutic milieu .       Observation Level/Precautions:  15 minute checks    Psychotherapy:  Individual and group therapy     Consultations:  Social worker  Discharge Concerns:stability and safety         I certify that inpatient services furnished can reasonably be expected to improve the patient's condition.    Randi [employer], MD 3/6/20172:34 PM

## 2015-09-13 NOTE — BH Assessment (Signed)
Assessment Note  Todd Shaw is a 30 y.o. male who presented as a walk-in to [facility].  He has a standing diagnosis of Bipolar I and is prescribed Seroquel (per previous report).  Pt reported that he is not compliant with meds and is experiencing auditory and visual hallucinations.  Pt was dressed in street clothes and appearance was fairly well-groomed.  Pt's mood was despondent/depressed, and affect was blunted.  Pt had poor eye contact -- he was curled over.  Demeanor was mild.  Pt was vague on SI -- "I don't know"; was silent was asked about previous suicidal ideation or previous suicide attempts.  Denied HI or self-harm.  Memory and concentration were poor.  Insight and judgment were fair.  Pt denied substance use (no labs at this writing).  Pt endorsed auditory and visual hallucinations related to God.  When asked, he repeated several times, "I don't know how to explain it" and then became silent.  Pt stated that he is prescribed Seroquel through [facility] but does not take it "because I feel like I was thrown on it."  Pt was silent during much of assessment, curled over with no eye contact.  He stated that he is experiencing auditory/visual hallucinations related to God, but could not provide further details.  Consulted with [name], FNP -- Pt meets criteria for inpatient.  Consulted with [name], RN [facility] -- Pt to be placed on adult unit directly.   Diagnosis: Bipolar I  Past Medical History:  Past Medical History  Diagnosis Date  . Bipolar 1 disorder (HCC)     No past surgical history on file.  Family History: No family history on file.  Social History:  reports that he has been smoking Cigarettes.  He has been smoking about 1.00 pack per day. He does not have any smokeless tobacco history on file. He reports that he does not drink alcohol or use illicit drugs.  Additional Social History:  Alcohol / Drug Use Pain Medications: See PTA Prescriptions: See PTA  -- Pt endorsed Seroquel prescription but said he does not take it Over the Counter: See PTA History of alcohol / drug use?: No history of alcohol / drug abuse (Pt denied)  CIWA:   COWS:    Allergies: No Known Allergies  Home Medications:  (Not in a hospital admission)  OB/GYN Status:  No LMP for male patient.  General Assessment Data Location of Assessment: [facility] TTS Assessment: In system Is this a Tele or Face-to-Face Assessment?: Face-to-Face Is this an Initial Assessment or a Re-assessment for this encounter?: Initial Assessment Marital status: Single Is patient pregnant?: No Pregnancy Status: No Living Arrangements: Parent Can pt return to current living arrangement?: Yes Admission Status: Voluntary Is patient capable of signing voluntary admission?: Yes Referral Source: Self/Family/Friend     Crisis Care Plan Living Arrangements: Parent Name of Psychiatrist: [name] recall name of psychiatrist)  Education Status Is patient currently in school?: No Highest grade of school patient has completed: 41 Name of school: [name]  Risk to self with the past 6 months Suicidal Ideation: No Has patient been a risk to self within the past 6 months prior to admission? : Other (comment) ("I can't explain it") Suicidal Intent: No Has patient had any suicidal intent within the past 6 months prior to admission? : Other (comment) ("I can't explain it") Is patient at risk for suicide?: Yes ("I don't know") Suicidal Plan?: No Has patient had any suicidal plan  within the past 6 months prior to admission? : No Access to Means: No Previous Attempts/Gestures: No Intentional Self Injurious Behavior: None Family Suicide History: Unknown ("I don't know") Recent stressful life event(s): Conflict (Comment) (Conflict with family -- details uncertain) Persecutory voices/beliefs?: Yes Depression: Yes Depression Symptoms: Despondent, Insomnia Substance abuse  history and/or treatment for substance abuse?: No (Pt denied; no labs at writing) Suicide prevention information given to non-admitted patients: Not applicable  Risk to Others within the past 6 months Homicidal Ideation: No Does patient have any lifetime risk of violence toward others beyond the six months prior to admission? : No Thoughts of Harm to Others: No Current Homicidal Intent: No Current Homicidal Plan: No Access to Homicidal Means: No History of harm to others?: No Assessment of Violence: None Noted Does patient have access to weapons?: No Criminal Charges Pending?: No Does patient have a court date: No Is patient on probation?: No  Psychosis Hallucinations: Auditory, Visual Delusions: None noted  Mental Status Report Appearance/Hygiene: Unremarkable (Street clothes) Eye Contact: Poor Motor Activity: Unremarkable Speech: Soft, Slow, Tangential Level of Consciousness: Quiet/awake Mood: Depressed, Preoccupied Affect: Blunted Anxiety Level: None Thought Processes: Tangential Judgement: Unimpaired Orientation: Person, Place, Time, Situation Obsessive Compulsive Thoughts/Behaviors: None  Cognitive Functioning Concentration: Decreased Memory: Recent Impaired, Remote Impaired IQ: Average Insight: Fair Impulse Control: Fair Appetite: Good Sleep: Decreased Vegetative Symptoms:  (Pt did not respond)  ADLScreening [facility]) Patient's cognitive ability adequate to safely complete daily activities?: Yes Patient able to express need for assistance with ADLs?: Yes Independently performs ADLs?: Yes (appropriate for developmental age)  Prior Inpatient Therapy Prior Inpatient Therapy: Yes Prior Therapy Dates: UNK  Prior Outpatient Therapy Prior Outpatient Therapy: Yes Prior Therapy Facilty/Provider(s): [name] Reason for Treatment: Bipolar I Does patient have an ACCT team?: No Does patient have Intensive In-House Services?  : No Does patient have  [name] services? : Yes Does patient have P4CC services?: No  ADL Screening (condition at time of admission) Patient's cognitive ability adequate to safely complete daily activities?: Yes Is the patient deaf or have difficulty hearing?: No Does the patient have difficulty seeing, even when wearing glasses/contacts?: No Does the patient have difficulty concentrating, remembering, or making decisions?: No Patient able to express need for assistance with ADLs?: Yes Does the patient have difficulty dressing or bathing?: No Independently performs ADLs?: Yes (appropriate for developmental age) Does the patient have difficulty walking or climbing stairs?: No Weakness of Legs: None Weakness of Arms/Hands: None             Advance Directives (For Healthcare) Does patient have an advance directive?: No Would patient like information on creating an advanced directive?: No - patient declined information    Additional Information 1:1 In Past 12 Months?: No CIRT Risk: No Elopement Risk: No Does patient have medical clearance?: Yes     Disposition:  Disposition Initial Assessment Completed for this Encounter: Yes Disposition of Patient: Referred to ([facility] for med clearance; per FNP, Pt meet inpt criteria) Patient referred to:  [facility] for med clearance, then inpatient placement)  On Site Evaluation by:   Reviewed with Physician:    Dorris FetchEugene T Shadow Schedler 09/13/2015 10:01 AM

## 2015-09-13 NOTE — Progress Notes (Addendum)
Aurther Lofterry was admitted to room 506-2 as a walk in.  He denies SI/HI or A/V hallucinations.  He was very quiet and not very forthcoming with answers.  He had poor eye contact.  He would answer basic questions but then would keep repeating "I don't know how to explain it."  He admitted that he is getting special messages from the TV but "I can't explain what they are telling me."  He admits to some depression, anxiety, hopelessness, loneliness, crying and poor concentration.  He states that he has been on Seroquel but hasn't taken it over a month.  He stopped taking the medication because "I needed to work and didn't follow up at [facility]."   Skin assessment completed; tattoo on left arm/wrist, well healed laceration to left neck and well healed laceration to top of head.  Belongings searched and placed in locker 48 (shoes(sperries), cell phone, cell phone charger, socks, jacket, hat, lighter and $0.08).  Reviewed admission paperwork and obtained appropriate signatures.  Oriented to unit.  Q 15 minute checks initiated for safety.  Encouraged participation in group and unit activities.  We will monitor the progress towards his goals.

## 2015-09-14 DIAGNOSIS — F333 Major depressive disorder, recurrent, severe with psychotic symptoms: Secondary | ICD-10-CM | POA: Clinically undetermined

## 2015-09-14 LAB — LIPID PANEL
CHOLESTEROL: 141 mg/dL (ref 0–200)
HDL: 40 mg/dL — AB (ref >40)
LDL Cholesterol: 84 mg/dL (ref 0–99)
TRIGLYCERIDES: 86 mg/dL (ref <150)
Total CHOL/HDL Ratio: 3.5 RATIO
VLDL: 17 mg/dL (ref 0–40)

## 2015-09-14 MED ORDER — BUPROPION HCL 100 MG PO TABS
100.0000 mg | ORAL_TABLET | Freq: Every day | ORAL | Status: DC
Start: 2015-09-15 — End: 2015-09-15
  Administered 2015-09-15: 100 mg via ORAL
  Filled 2015-09-14 (×3): qty 1

## 2015-09-14 NOTE — Tx Team (Signed)
Interdisciplinary Treatment Plan Update (Adult)  Date:  09/14/2015  Time Reviewed:  8:51 AM   Progress in Treatment: Attending groups: No. New to unit. Continuing to assess.  Participating in groups:  No. Taking medication as prescribed:  Yes. Tolerating medication:  Yes. Family/Significant othe contact made:  SPE required for this pt.  Patient understands diagnosis:  Yes. and As evidenced by:  seeking treatment for AVH, depression, mood instability/bipolar I disorder, and for medication stabilization. Discussing patient identified problems/goals with staff:  Yes. Medical problems stabilized or resolved:  Yes. Denies suicidal/homicidal ideation: Yes. Issues/concerns per patient self-inventory:  Other:  Discharge Plan or Barriers:  CSW assessing for appropriate referrals. Pt reports that he had gone to [facility] for medication management but has hx of medication noncompliance.   Reason for Continuation of Hospitalization: Depression Hallucinations Medication stabilization  Comments:  Todd Shaw is a 30 y.o. male who presented as a walk-in to [facility]. He has a standing diagnosis of Bipolar I. Pt reported that he is not compliant with meds and is experiencing auditory and visual hallucinations related to God. Pt's mood was despondent/depressed, and affect was blunted. Pt had poor eye contact -- he was curled over.Pt was vague on SI -- "I don't know"; was silent was asked about previous suicidal ideation or previous suicide attempts. Denied HI or self-harm. Memory and concentration were poor. Insight and judgment were fair. Pt denied substance use. Pt stated that he is prescribed Seroquel through [facility] but does not take it "because I feel like I was thrown on it."Diagnosis: Bipolar I with psychosis.  Estimated length of stay:  3-5 days   New goal(s): to develop effective aftercare plan.   Additional Comments:  Patient and CSW reviewed pt's identified  goals and treatment plan. Patient verbalized understanding and agreed to treatment plan. CSW reviewed [facility] "Discharge Process and Patient Involvement" Form. Pt verbalized understanding of information provided and signed form.    Review of initial/current patient goals per problem list:  1. Goal(s): Patient will participate in aftercare plan  Met: No.   Target date: at discharge  As evidenced by: Patient will participate within aftercare plan AEB aftercare provider and housing plan at discharge being identified.  3/7: CSW assessing for appropriate referrals.   2. Goal (s): Patient will exhibit decreased depressive symptoms and suicidal ideations.  Met: No.    Target date: at discharge  As evidenced by: Patient will utilize self rating of depression at 3 or below and demonstrate decreased signs of depression or be deemed stable for discharge by MD.  3/7: Pt rates depression as high. Denies SI/HI this morning.   3. Goal(s): Patient will demonstrate decreased signs and symptoms of psychosis.   Met:  Target date: at discharge  As evidenced by: Patient will demonstrateddecreased signs of psychosis, or be deemed stable for discharge by MD.  3/7: Pt reports continuing AVH relating to God, poor memory and concentration.  Attendees: Patient:   09/14/2015 8:51 AM   Family:   09/14/2015 8:51 AM   Physician:  Dr. Carlton Adam, MD 09/14/2015 8:51 AM   Nursing:   Maurice Small RN 09/14/2015 8:51 AM   Clinical Social Worker: Maxie Better, LCSW 09/14/2015 8:51 AM   Clinical Social Worker: Erasmo Downer Drinkard LCSW; Peri Maris LCSWA 09/14/2015 8:51 AM   Other:  Gerline Legacy Nurse Case Manager 09/14/2015 8:51 AM   Other:   09/14/2015 8:51 AM   Other:   09/14/2015 8:51 AM   Other:  09/14/2015 8:51 AM   Other:  09/14/2015 8:51 AM   Other:  09/14/2015 8:51 AM    09/14/2015 8:51 AM    09/14/2015 8:51 AM    09/14/2015 8:51 AM    09/14/2015 8:51 AM    Scribe for Treatment Team:   Maxie Better,  LCSW 09/14/2015 8:51 AM

## 2015-09-14 NOTE — [facility] Group Notes (Signed)
[facility] Group Notes:  (Nursing/MHT/Case Management/Adjunct)  Date:  09/14/2015  Time:  9:30am  Type of Therapy:  Nurse Education  Participation Level:  Did not attend  Modes of Intervention:  Discussion and Education  Summary of Progress/Problems:  Group topic today was recovery.  Discussed what recovery means to the group.  Discussed long term and short term goals as well as sleep hygiene.  Aurther Lofterry chose not to attend group even after much encouragement.    Norm ParcelHeather V Vernica Wachtel 09/14/2015, 11:11 AM

## 2015-09-14 NOTE — Progress Notes (Signed)
DAR Note: Todd Shaw has been in his room much of the morning.  He denies SI/HI or A/V hallucinations.  When he was asked questions, he would just put his head down and state "I don't know how to explain that."  He didn't attend RN group or pharmacist group today but did attend SW group.  He hasn't completed his self inventory even after much encouragement.  He reports that he slept pretty good last night.  He did go [employer] meals and reports that he is eating well.  Encouraged continued participation in group and unit activities.  Q 15 minute checks maintained for safety.  We will continue to monitor the progress towards his goals.

## 2015-09-14 NOTE — [facility] Counselor (Signed)
CSW attempted to meet with patient this afternoon. Patient sleeping in room/snoring loudly. Pt did not respond when his name was called several times. CSW to attempt PSA again in the morning.  Trula SladeHeather Smart, MSW, LCSW Clinical Social Worker 09/14/2015 4:08 PM

## 2015-09-14 NOTE — Progress Notes (Signed)
Todd Shaw finally came out this afternoon.  He went to [employer] [address] to play the piano.  He was noted sitting in the day room talking with peers.  Encouraged continued participation in group and unit activities.  Q 15 minute checks maintained for safety.  We will continue to monitor the progress towards his goals.

## 2015-09-14 NOTE — Progress Notes (Signed)
[facility] MD Progress Note  09/14/2015 2:43 PM Todd Shaw  MRN:  [id number] Subjective:  Patient states " I am better.'  Objective:Todd Shaw is a 30 y.o. AA male, single , employed at a [employer] , lives with parents in [place], who has a past hx of psychosis ( ? Substance induced) who presented as a walk-in to [facility] for psychosis.  Patient seen and chart reviewed.Discussed patient with treatment team.  Patient today with some improvement of his AH, continues to be depressed. Pt continues to need encouragement to attend groups , participate in milieu. Will continue treatment.   Principal Problem: MDD (major depressive disorder), recurrent, severe, with psychosis (HCC) Diagnosis:   Patient Active Problem List   Diagnosis Date Noted  . MDD (major depressive disorder), recurrent, severe, with psychosis (HCC) [F33.3] 09/14/2015  . Attention deficit hyperactivity disorder (ADHD) [F90.9] 09/13/2015  . Cannabis use disorder, moderate, in sustained remission [F12.90] 09/13/2015  . Tobacco use disorder [F17.200] 09/13/2015   Total Time spent with patient: 30 minutes  Past Psychiatric History: Pt was admitted on 09/11/2008 - diagnosis of substance induced psychosis ( cough pill , cannabis ) . Pt follows up with [name]. Pt at that time responded well to Zyprexa.Pt denies past hx of suicide attempts  Past Medical History:  Past Medical History  Diagnosis Date  . Bipolar 1 disorder (HCC)    History reviewed. No pertinent past surgical history. Family History:  Family History  Problem Relation Age of Onset  . Mental illness Neg Hx    Family Psychiatric  History: Family Psychiatric History: Pt denies hx of mental illness, substance abuse in family Social History: Pt is single , employed at a [employer] , lives with parents in [place] , denies hx of legal issues.  History  Alcohol Use No     History  Drug Use No    Social History   Social History  . Marital Status: Single     Spouse Name: N/A  . Number of Children: N/A  . Years of Education: N/A   Social History Main Topics  . Smoking status: Current Every Day Smoker -- 1.00 packs/day    Types: Cigarettes  . Smokeless tobacco: None  . Alcohol Use: No  . Drug Use: No  . Sexual Activity: Not Currently   Other Topics Concern  . None   Social History Narrative   Additional Social History:    Pain Medications: See PTA Prescriptions: See PTA -- Pt endorsed Seroquel prescription but said he does not take it Over the Counter: See PTA History of alcohol / drug use?: No history of alcohol / drug abuse                    Sleep: Fair  Appetite:  Fair  Current Medications: Current Facility-Administered Medications  Medication Dose Route Frequency Provider Last Rate Last Dose  . acetaminophen (TYLENOL) tablet 650 mg  650 mg Oral Q6H PRN Todd Leyland, NP      . alum & mag hydroxide-simeth (MAALOX/MYLANTA) 200-200-20 MG/5ML suspension 30 mL  30 mL Oral Q4H PRN Todd Leyland, NP      . benztropine (COGENTIN) tablet 0.5 mg  0.5 mg Oral BH-qamhs Todd Dorner, MD   0.5 mg at 09/14/15 0807  . [START ON 09/15/2015] buPROPion (WELLBUTRIN) tablet 100 mg  100 mg Oral Daily Todd Voller, MD      . haloperidol (HALDOL) tablet 5 mg  5 mg Oral BH-qamhs Todd Hor  Sallye Lunz, MD   5 mg at 09/14/15 0807  . hydrOXYzine (ATARAX/VISTARIL) tablet 25 mg  25 mg Oral TID PRN Todd Alert, MD   25 mg at 09/13/15 1938  . LORazepam (ATIVAN) tablet 1 mg  1 mg Oral Q4H PRN Todd Alert, MD      . magnesium hydroxide (MILK OF MAGNESIA) suspension 30 mL  30 mL Oral Daily PRN Todd Hummer, NP      . nicotine polacrilex (NICORETTE) gum 2 mg  2 mg Oral PRN Todd Alert, MD      . OLANZapine zydis (ZYPREXA) disintegrating tablet 5 mg  5 mg Oral Q8H PRN Todd Hummer, NP      . traZODone (DESYREL) tablet 50 mg  50 mg Oral QHS PRN Todd Hummer, NP   50 mg at 09/13/15 2114    Lab Results:  Results for orders placed or performed  during the hospital encounter of 09/13/15 (from the past 48 hour(s))  Urine rapid drug screen (hosp performed)not at [facility]     Status: None   Collection Time: 09/13/15  1:50 PM  Result Value Ref Range   Opiates NONE DETECTED NONE DETECTED   Cocaine NONE DETECTED NONE DETECTED   Benzodiazepines NONE DETECTED NONE DETECTED   Amphetamines NONE DETECTED NONE DETECTED   Tetrahydrocannabinol NONE DETECTED NONE DETECTED   Barbiturates NONE DETECTED NONE DETECTED    Comment:        DRUG SCREEN FOR MEDICAL PURPOSES ONLY.  IF CONFIRMATION IS NEEDED FOR ANY PURPOSE, NOTIFY LAB WITHIN 5 DAYS.        LOWEST DETECTABLE LIMITS FOR URINE DRUG SCREEN Drug Class       Cutoff (ng/mL) Amphetamine      1000 Barbiturate      200 Benzodiazepine   831 Tricyclics       517 Opiates          300 Cocaine          300 THC              50 Performed at [facility]   CBC     Status: None   Collection Time: 09/13/15  6:15 PM  Result Value Ref Range   WBC 5.3 4.0 - 10.5 K/uL   RBC 5.15 4.22 - 5.81 MIL/uL   Hemoglobin 15.8 13.0 - 17.0 g/dL   HCT 47.6 39.0 - 52.0 %   MCV 92.4 78.0 - 100.0 fL   MCH 30.7 26.0 - 34.0 pg   MCHC 33.2 30.0 - 36.0 g/dL   RDW 13.4 11.5 - 15.5 %   Platelets 281 150 - 400 K/uL    Comment: Performed at [facility]  Comprehensive metabolic panel     Status: Abnormal   Collection Time: 09/13/15  6:15 PM  Result Value Ref Range   Sodium 140 135 - 145 mmol/L   Potassium 3.8 3.5 - 5.1 mmol/L   Chloride 106 101 - 111 mmol/L   CO2 24 22 - 32 mmol/L   Glucose, Bld 125 (H) 65 - 99 mg/dL   BUN 12 6 - 20 mg/dL   Creatinine, Ser 1.03 0.61 - 1.24 mg/dL   Calcium 9.5 8.9 - 10.3 mg/dL   Total Protein 7.5 6.5 - 8.1 g/dL   Albumin 4.6 3.5 - 5.0 g/dL   AST 30 15 - 41 U/L   ALT 18 17 - 63 U/L   Alkaline Phosphatase 57 38 - 126 U/L   Total Bilirubin  0.4 0.3 - 1.2 mg/dL   GFR calc non Af Amer >60 >60 mL/min   GFR calc Af Amer >60 >60 mL/min    Comment:  (NOTE) The eGFR has been calculated using the CKD EPI equation. This calculation has not been validated in all clinical situations. eGFR's persistently <60 mL/min signify possible Chronic Kidney Disease.    Anion gap 10 5 - 15    Comment: Performed at [facility]  Ethanol     Status: None   Collection Time: 09/13/15  6:15 PM  Result Value Ref Range   Alcohol, Ethyl (B) <5 <5 mg/dL    Comment:        LOWEST DETECTABLE LIMIT FOR SERUM ALCOHOL IS 5 mg/dL FOR MEDICAL PURPOSES ONLY Performed at [facility]   TSH     Status: None   Collection Time: 09/13/15  6:15 PM  Result Value Ref Range   TSH 2.091 0.350 - 4.500 uIU/mL    Comment: Performed at [facility]  Lipid panel     Status: Abnormal   Collection Time: 09/14/15  6:35 AM  Result Value Ref Range   Cholesterol 141 0 - 200 mg/dL   Triglycerides 86 <150 mg/dL   HDL 40 (L) >40 mg/dL   Total CHOL/HDL Ratio 3.5 RATIO   VLDL 17 0 - 40 mg/dL   LDL Cholesterol 84 0 - 99 mg/dL    Comment:        Total Cholesterol/HDL:CHD Risk Coronary Heart Disease Risk Table                     Men   Women  1/2 Average Risk   3.4   3.3  Average Risk       5.0   4.4  2 X Average Risk   9.6   7.1  3 X Average Risk  23.4   11.0        Use the calculated Patient Ratio above and the CHD Risk Table to determine the patient's CHD Risk.        ATP III CLASSIFICATION (LDL):  <100     mg/dL   Optimal  100-129  mg/dL   Near or Above                    Optimal  130-159  mg/dL   Borderline  160-189  mg/dL   High  >190     mg/dL   Very High Performed at [facility]     Blood Alcohol level:  Lab Results  Component Value Date   [facility] <5 09/13/2015   [facility]  09/11/2008    <5        LOWEST DETECTABLE LIMIT FOR SERUM ALCOHOL IS 5 mg/dL FOR MEDICAL PURPOSES ONLY    Physical Findings: AIMS: Facial and Oral Movements Muscles of Facial Expression: None, normal Lips and Perioral Area:  None, normal Jaw: None, normal Tongue: None, normal,Extremity Movements Upper (arms, wrists, hands, fingers): None, normal Lower (legs, knees, ankles, toes): None, normal, Trunk Movements Neck, shoulders, hips: None, normal, Overall Severity Severity of abnormal movements (highest score from questions above): None, normal Incapacitation due to abnormal movements: None, normal Patient's awareness of abnormal movements (rate only patient's report): No Awareness, Dental Status Current problems with teeth and/or dentures?: No Does patient usually wear dentures?: No  CIWA:    COWS:     Musculoskeletal: Strength & Muscle Tone: within normal limits Gait &  Station: normal Patient leans: N/A  Psychiatric Specialty Exam: Review of Systems  Psychiatric/Behavioral: Positive for depression and hallucinations. The patient is nervous/anxious.   All other systems reviewed and are negative.   Blood pressure 101/75, pulse 104, temperature 98 F (36.7 C), temperature source Oral, resp. rate 18, height '5\' 7"'$  (1.702 m), weight 79.379 kg (175 lb).Body mass index is 27.4 kg/(m^2).  General Appearance: Fairly Groomed  Engineer, water::  Poor  Speech:  Normal Rate  Volume:  Decreased  Mood:  Depressed  Affect:  Depressed  Thought Process:  Goal Directed  Orientation:  Full (Time, Place, and Person)  Thought Content:  Hallucinations: Auditory and Rumination  Suicidal Thoughts:  No  Homicidal Thoughts:  No  Memory:  Immediate;   Fair Recent;   Fair Remote;   Fair  Judgement:  Impaired  Insight:  Shallow  Psychomotor Activity:  Decreased  Concentration:  Poor  Recall:  [employer] of Knowledge:Fair  Language: Fair  Akathisia:  No  Handed:  Right  AIMS (if indicated):     Assets:  Desire for Improvement  ADL's:  Intact  Cognition: WNL  Sleep:  Number of Hours: 6.5   Treatment Plan Summary:Daelyn Pamer is a 30 y.o. AA male, single , employed at a [employer] , lives with parents in [place], who has a  past hx of psychosis ( ? Substance induced) who presented as a walk-in to [facility] for psychosis. Patient will benefit from continued treatment. Daily contact with patient to assess and evaluate symptoms and progress in treatment and Medication management Will continue Haldol 5 mg po bid for psychosis. Will continue  Cogentin 0.5 mg po bid for EPS. Will increase Wellbutrin to 100 mg po daily for affective sx. Will continue Trazodone 50 mg po qhs prn for sleep. Will make available PRN medications as per agitation protocol. Will continue to monitor vitals ,medication compliance and treatment side effects while patient is here.  Will monitor for medical issues as well as call consult as needed.  Will order all routine labs including cbc- wnl , cmp- wnl , tsh- wnl , lipid panel- wnl ,pending hba1c, BAL <5 ,UDS- negative , EKG for qtc - wnl. CSW will continue  working on disposition.  Patient to participate in therapeutic milieu .     Murlin Schrieber, MD 09/14/2015, 2:43 PM

## 2015-09-14 NOTE — Progress Notes (Signed)
   D: Pt was laying on his bed during the assessment. Writer introduced herself to the pt, who barely looked, and then turned his head to the opposite direction. Pt wouldn't engage in conversation with the writer but did answer questions yes or no questions regarding his day. Pt has no questions or concerns.    A:  Support and encouragement was offered. 15 min checks continued for safety.  R: Pt remains safe.

## 2015-09-14 NOTE — [facility] Group Notes (Signed)
[facility] LCSW Group Therapy  09/14/2015 , 5:03 PM   Type of Therapy:  Group Therapy  Participation Level:  Active  Participation Quality:  Attentive  Affect:  Appropriate  Cognitive:  Alert  Insight:  Improving  Engagement in Therapy:  Engaged  Modes of Intervention:  Discussion, Exploration and Socialization  Summary of Progress/Problems: Today's group focused on the term Diagnosis.  Participants were asked to define the term, and then pronounce whether it is a negative, positive or neutral term. Stayed the entire time.  Engaged throughout.  Talked about his appreciation for the community here as he has felt welcomed and encouraged.  Specifically cited the smiles of others as contagious and helpful.  Stated his family is good support outside of here.  [name], [place] Harshfield B 09/14/2015 , 5:03 PM

## 2015-09-15 LAB — PROLACTIN: PROLACTIN: 30.8 ng/mL — AB (ref 4.0–15.2)

## 2015-09-15 LAB — HEMOGLOBIN A1C
HEMOGLOBIN A1C: 5.5 % (ref 4.8–5.6)
MEAN PLASMA GLUCOSE: 111 mg/dL

## 2015-09-15 MED ORDER — BUPROPION HCL ER (XL) 150 MG PO TB24
150.0000 mg | ORAL_TABLET | Freq: Every day | ORAL | Status: DC
  Administered 2015-09-16 – 2015-09-17 (×2): 150 mg via ORAL
  Filled 2015-09-15 (×3): qty 1
  Filled 2015-09-15: qty 7

## 2015-09-15 NOTE — Progress Notes (Signed)
DAR Note: Todd Shaw spent much of the morning in the bed.  He didn't attend AM groups but did get up for afternoon group.  He gets up for meals.  He denies any SI/HI or A/V hallucinations.  He denies any pain or discomfort and appears to be in no physical distress.  He didn't complete his self inventory even after much encouragement. Encouraged continued participation in group and unit activities.  Q 15 minute checks maintained for safety.  We will continue to monitor the progress towards his goals.

## 2015-09-15 NOTE — Plan of Care (Signed)
Problem: Alteration in mood Goal: STG-Patient is able to discuss feelings and issues (Patient is able to discuss feelings and issues leading to depression)  Outcome: Progressing Pt able to discuss feelings and reports relief from anxiety.

## 2015-09-15 NOTE — Progress Notes (Signed)
[facility] MD Progress Note  09/15/2015 2:07 PM Todd Shaw  MRN:  [id number] Subjective:  Patient states " I am a bit better , but I am still depressed."  Objective:Todd Shaw is a 30 y.o. AA male, single , employed at a [employer] , lives with parents in [place], who has a past hx of psychosis ( ? Substance induced) who presented as a walk-in to [facility] for psychosis.  Patient seen and chart reviewed.Discussed patient with treatment team.  Patient today with some improvement of his AH, continues to be depressed.Rates his depression at a 7 /10 , 10 being the worst. Pt continues to need encouragement to attend groups , participate in milieu. Per staff - is more visible on the unit . Will continue treatment.   Principal Problem: MDD (major depressive disorder), recurrent, severe, with psychosis ([place]) Diagnosis:   Patient Active Problem List   Diagnosis Date Noted  . MDD (major depressive disorder), recurrent, severe, with psychosis ([place]) [F33.3] 09/14/2015  . Attention deficit hyperactivity disorder (ADHD) [F90.9] 09/13/2015  . Cannabis use disorder, moderate, in sustained remission [F12.90] 09/13/2015  . Tobacco use disorder [F17.200] 09/13/2015   Total Time spent with patient: 25 minutes  Past Psychiatric History: Pt was admitted on 09/11/2008 - diagnosis of substance induced psychosis ( cough pill , cannabis ) . Pt follows up with [name]. Pt at that time responded well to Zyprexa.Pt denies past hx of suicide attempts  Past Medical History:  Past Medical History  Diagnosis Date  . Bipolar 1 disorder ([place])    History reviewed. No pertinent past surgical history. Family History:  Family History  Problem Relation Age of Onset  . Mental illness Neg Hx    Family Psychiatric  History: Family Psychiatric History: Pt denies hx of mental illness, substance abuse in family Social History: Pt is single , employed at a [employer]) , lives with parents in [place] , denies hx of legal  issues.  History  Alcohol Use No     History  Drug Use No    Social History   Social History  . Marital Status: Single    Spouse Name: N/A  . Number of Children: N/A  . Years of Education: N/A   Social History Main Topics  . Smoking status: Current Every Day Smoker -- 1.00 packs/day    Types: Cigarettes  . Smokeless tobacco: None  . Alcohol Use: No  . Drug Use: No  . Sexual Activity: Not Currently   Other Topics Concern  . None   Social History Narrative   Additional Social History:    Pain Medications: See PTA Prescriptions: See PTA -- Pt endorsed Seroquel prescription but said he does not take it Over the Counter: See PTA History of alcohol / drug use?: No history of alcohol / drug abuse                    Sleep: Fair  Appetite:  Fair  Current Medications: Current Facility-Administered Medications  Medication Dose Route Frequency Provider Last Rate Last Dose  . acetaminophen (TYLENOL) tablet 650 mg  650 mg Oral Q6H PRN Todd Hummer, NP      . alum & mag hydroxide-simeth (MAALOX/MYLANTA) 200-200-20 MG/5ML suspension 30 mL  30 mL Oral Q4H PRN Todd Hummer, NP      . benztropine (COGENTIN) tablet 0.5 mg  0.5 mg Oral BH-qamhs Todd Earwood, MD   0.5 mg at 09/15/15 0756  . [START ON 09/16/2015] buPROPion (  WELLBUTRIN XL) 24 hr tablet 150 mg  150 mg Oral Daily Todd Lazenby, MD      . haloperidol (HALDOL) tablet 5 mg  5 mg Oral BH-qamhs Todd Moster, MD   5 mg at 09/15/15 0756  . hydrOXYzine (ATARAX/VISTARIL) tablet 25 mg  25 mg Oral TID PRN Todd Alert, MD   25 mg at 09/13/15 1938  . LORazepam (ATIVAN) tablet 1 mg  1 mg Oral Q4H PRN Todd Alert, MD      . magnesium hydroxide (MILK OF MAGNESIA) suspension 30 mL  30 mL Oral Daily PRN Todd Hummer, NP      . nicotine polacrilex (NICORETTE) gum 2 mg  2 mg Oral PRN Todd Alert, MD      . OLANZapine zydis (ZYPREXA) disintegrating tablet 5 mg  5 mg Oral Q8H PRN Todd Hummer, NP      . traZODone  (DESYREL) tablet 50 mg  50 mg Oral QHS PRN Todd Hummer, NP   50 mg at 09/14/15 2110    Lab Results:  Results for orders placed or performed during the hospital encounter of 09/13/15 (from the past 48 hour(s))  CBC     Status: None   Collection Time: 09/13/15  6:15 PM  Result Value Ref Range   WBC 5.3 4.0 - 10.5 K/uL   RBC 5.15 4.22 - 5.81 MIL/uL   Hemoglobin 15.8 13.0 - 17.0 g/dL   HCT 47.6 39.0 - 52.0 %   MCV 92.4 78.0 - 100.0 fL   MCH 30.7 26.0 - 34.0 pg   MCHC 33.2 30.0 - 36.0 g/dL   RDW 13.4 11.5 - 15.5 %   Platelets 281 150 - 400 K/uL    Comment: Performed at [facility]  Comprehensive metabolic panel     Status: Abnormal   Collection Time: 09/13/15  6:15 PM  Result Value Ref Range   Sodium 140 135 - 145 mmol/L   Potassium 3.8 3.5 - 5.1 mmol/L   Chloride 106 101 - 111 mmol/L   CO2 24 22 - 32 mmol/L   Glucose, Bld 125 (H) 65 - 99 mg/dL   BUN 12 6 - 20 mg/dL   Creatinine, Ser 1.03 0.61 - 1.24 mg/dL   Calcium 9.5 8.9 - 10.3 mg/dL   Total Protein 7.5 6.5 - 8.1 g/dL   Albumin 4.6 3.5 - 5.0 g/dL   AST 30 15 - 41 U/L   ALT 18 17 - 63 U/L   Alkaline Phosphatase 57 38 - 126 U/L   Total Bilirubin 0.4 0.3 - 1.2 mg/dL   GFR calc non Af Amer >60 >60 mL/min   GFR calc Af Amer >60 >60 mL/min    Comment: (NOTE) The eGFR has been calculated using the CKD EPI equation. This calculation has not been validated in all clinical situations. eGFR's persistently <60 mL/min signify possible Chronic Kidney Disease.    Anion gap 10 5 - 15    Comment: Performed at [facility]  Ethanol     Status: None   Collection Time: 09/13/15  6:15 PM  Result Value Ref Range   Alcohol, Ethyl (B) <5 <5 mg/dL    Comment:        LOWEST DETECTABLE LIMIT FOR SERUM ALCOHOL IS 5 mg/dL FOR MEDICAL PURPOSES ONLY Performed at [facility]   TSH     Status: None   Collection Time: 09/13/15  6:15 PM  Result Value Ref Range   TSH 2.091  0.350 - 4.500  uIU/mL    Comment: Performed at [facility]  Hemoglobin A1c     Status: None   Collection Time: 09/13/15  6:15 PM  Result Value Ref Range   Hgb A1c MFr Bld 5.5 4.8 - 5.6 %    Comment: (NOTE)         Pre-diabetes: 5.7 - 6.4         Diabetes: >6.4         Glycemic control for adults with diabetes: <7.0    Mean Plasma Glucose 111 mg/dL    Comment: (NOTE) Performed At: [facility] [address] [place], [place] [id number] Mila Homer MD [id number] Performed at [facility]   Prolactin     Status: Abnormal   Collection Time: 09/13/15  6:15 PM  Result Value Ref Range   Prolactin 30.8 (H) 4.0 - 15.2 ng/mL    Comment: (NOTE) Performed At: [facility] [address] [place], [place] [id number] Mila Homer MD [id number] Performed at [facility]   Lipid panel     Status: Abnormal   Collection Time: 09/14/15  6:35 AM  Result Value Ref Range   Cholesterol 141 0 - 200 mg/dL   Triglycerides 86 <469 mg/dL   HDL 40 (L) >80 mg/dL   Total CHOL/HDL Ratio 3.5 RATIO   VLDL 17 0 - 40 mg/dL   LDL Cholesterol 84 0 - 99 mg/dL    Comment:        Total Cholesterol/HDL:CHD Risk Coronary Heart Disease Risk Table                     Men   Women  1/2 Average Risk   3.4   3.3  Average Risk       5.0   4.4  2 X Average Risk   9.6   7.1  3 X Average Risk  23.4   11.0        Use the calculated Patient Ratio above and the CHD Risk Table to determine the patient's CHD Risk.        ATP III CLASSIFICATION (LDL):  <100     mg/dL   Optimal  607-895  mg/dL   Near or Above                    Optimal  130-159  mg/dL   Borderline  011-567  mg/dL   High  >164     mg/dL   Very High Performed at [facility]     Blood Alcohol level:  Lab Results  Component Value Date   [facility] <5 09/13/2015   [facility]  09/11/2008    <5        LOWEST DETECTABLE LIMIT FOR SERUM ALCOHOL IS 5 mg/dL FOR MEDICAL PURPOSES ONLY     Physical Findings: AIMS: Facial and Oral Movements Muscles of Facial Expression: None, normal Lips and Perioral Area: None, normal Jaw: None, normal Tongue: None, normal,Extremity Movements Upper (arms, wrists, hands, fingers): None, normal Lower (legs, knees, ankles, toes): None, normal, Trunk Movements Neck, shoulders, hips: None, normal, Overall Severity Severity of abnormal movements (highest score from questions above): None, normal Incapacitation due to abnormal movements: None, normal Patient's awareness of abnormal movements (rate only patient's report): No Awareness, Dental Status Current problems with teeth and/or dentures?: No Does patient usually wear dentures?: No  CIWA:    COWS:     Musculoskeletal: Strength &  Muscle Tone: within normal limits Gait & Station: normal Patient leans: N/A  Psychiatric Specialty Exam: Review of Systems  Psychiatric/Behavioral: Positive for depression and hallucinations. The patient is nervous/anxious.   All other systems reviewed and are negative.   Blood pressure 98/52, pulse 112, temperature 97.9 F (36.6 C), temperature source Oral, resp. rate 16, height '5\' 7"'$  (1.702 m), weight 79.379 kg (175 lb).Body mass index is 27.4 kg/(m^2).  General Appearance: Fairly Groomed  Engineer, water::  Poor  Speech:  Normal Rate  Volume:  Decreased  Mood:  Depressed  Affect:  Depressed  Thought Process:  Goal Directed  Orientation:  Full (Time, Place, and Person)  Thought Content:  Hallucinations: Auditory and Rumination improving  Suicidal Thoughts:  No  Homicidal Thoughts:  No  Memory:  Immediate;   Fair Recent;   Fair Remote;   Fair  Judgement:  Impaired  Insight:  Shallow  Psychomotor Activity:  Decreased  Concentration:  Poor  Recall:  [employer] of Knowledge:Fair  Language: Fair  Akathisia:  No  Handed:  Right  AIMS (if indicated):     Assets:  Desire for Improvement  ADL's:  Intact  Cognition: WNL  Sleep:  Number of Hours:  6.75   Treatment Plan Summary:Lynwood Carmen is a 30 y.o. AA male, single , employed at a [employer] , lives with parents in [place], who has a past hx of psychosis ( ? Substance induced) who presented as a walk-in to [facility] for psychosis.Pt with some improvement of his depression. Patient will benefit from continued treatment. Daily contact with patient to assess and evaluate symptoms and progress in treatment and Medication management Will continue Haldol 5 mg po bid for psychosis. Will continue  Cogentin 0.5 mg po bid for EPS. Will increase Wellbutrin XL to 150  mg po daily for affective sx. Will continue Trazodone 50 mg po qhs prn for sleep. Will make available PRN medications as per agitation protocol. Will continue to monitor vitals ,medication compliance and treatment side effects while patient is here.  Will monitor for medical issues as well as call consult as needed.  Will order all routine labs including cbc- wnl , cmp- wnl , tsh- wnl , lipid panel- wnl ,PL - 30 ( will need to monitor on and out patient basis) hba1c- 5.5 , BAL <5 ,UDS- negative , EKG for qtc - wnl. CSW will continue  working on disposition.  Patient to participate in therapeutic milieu .     Danyelle Brookover, MD 09/15/2015, 2:07 PM

## 2015-09-15 NOTE — Progress Notes (Signed)
Patient ID: Todd Shaw, male   DOB: 1986-05-03, 30 y.o.   MRN: [id number] D: Patient in room resting on approach. Patient cooperative denies SI/HI/AVH and pain. Pt reports he was feeling down and anxious after dinner. Pt stated relief from medication given by previous nurse. No acute distress noted.  A: Support and encouragement offered as needed. Medication administered as prescribed. Encourage pt to fill out self inventory report. R: Patient remains safe and complaint with medications. Self inventory filled out. Will continue to monitor for safety and stability.

## 2015-09-15 NOTE — BHH Counselor (Signed)
Adult Comprehensive Assessment  Patient ID: Todd Shaw, male   DOB: 1985/08/28, 28 y.o.   MRN: [id number]  Information Source:    Current Stressors:  Educational / Learning stressors: Pt would like to go back to school  Employment / Job issues: Missing time from work due to being in the hospital  Family Relationships: Conflictual relationship with some family members  Surveyor, quantityinancial / Lack of resources (include bankruptcy): None reported  Housing / Lack of housing: None reported  Physical health (include injuries & life threatening diseases): None reported  Social relationships: None reported  Substance abuse: Pt denies  Bereavement / Loss: None reported   Living/Environment/Situation:  Living Arrangements: Parent (Staying with mother and step-father ) Living conditions (as described by patient or guardian): "It's alright." How long has patient lived in current situation?: About 6 or 7 months. Prior to staying with his parents pt was living with his sister. What is atmosphere in current home: Comfortable  Family History:  Marital status: Single Are you sexually active?: No What is your sexual orientation?: Heterosexual  Has your sexual activity been affected by drugs, alcohol, medication, or emotional stress?: "Not that I know of" Does patient have children?: No  Childhood History:  By whom was/is the patient raised?: Mother Additional childhood history information: "Pretty good. I had a pretty good decent life honestly." Description of patient's relationship with caregiver when they were a child: "Pretty good. Close relationship." Patient's description of current relationship with people who raised him/her: Pt still has a close relationship with his mother. Pt reports having little contact with his bio father. How were you disciplined when you got in trouble as a child/adolescent?: 'I used to get whoopings. No time-out or nothing like that." Does patient have siblings?: Yes Number of  Siblings: 5 (2 brothers and 3 sisters ) Description of patient's current relationship with siblings: 'It's alright. Closer to some siblings than others." Did patient suffer any verbal/emotional/physical/sexual abuse as a child?: No Did patient suffer from severe childhood neglect?: No (No childhood neglect but pt reports feeling like the outcast of his familly.) Has patient ever been sexually abused/assaulted/raped as an adolescent or adult?: No Was the patient ever a victim of a crime or a disaster?: No Witnessed domestic violence?: No Has patient been effected by domestic violence as an adult?: No  Education:  Highest grade of school patient has completed: Graduated HS Currently a student?: No (Pt would like to go back to school for Counselling psychologistchemical engineering) Name of school: NA Learning disability?: No  Employment/Work Situation:   Employment situation: Employed Where is patient currently employed?: [employer], works as a Location managermachine operator  How long has patient been employed?: About 3 weks  Patient's job has been impacted by current illness: Yes Describe how patient's job has been impacted: Pt reports that some days he would be in deep thought or feel very depressed and not want to go to work What is the longest time patient has a held a job?: Pt cannot recall the longest that he's ever held a job however he does report that every time he has a job he ends up losing it because of mental health concerns. "It's a pattern for me." Where was the patient employed at that time?: NA Has patient ever been in the Eli Lilly and Companymilitary?: No Has patient ever served in combat?: No Did You Receive Any Psychiatric Treatment/Services While in [employer]ities traderthe Military?:  (NA) Are There Guns or Other Weapons in Your Home?: No Are These Weapons Safely  Secured?:  (NA)  Financial Resources:   Financial resources: Income from employment Does patient have a representative payee or guardian?: No  Alcohol/Substance Abuse:   What has  been your use of drugs/alcohol within the last 12 months?: Smokes THC occasionally and does not drink.  If attempted suicide, did drugs/alcohol play a role in this?: No Alcohol/Substance Abuse Treatment Hx: Denies past history Has alcohol/substance abuse ever caused legal problems?: No  Social Support System:   Patient's Community Support System: Good Describe Community Support System: Mother  Type of faith/religion: Ephriam Knuckles  How does patient's faith help to cope with current illness?: "Pretty well"  Leisure/Recreation:   Leisure and Hobbies: Play music, play pianio, keyboard, organ, reading   Strengths/Needs:   What things does the patient do well?: Playing music and working  In what areas does patient struggle / problems for patient: Staying focused and concentrating, would like to become more independent   Discharge Plan:   Does patient have access to transportation?: Yes (Family will transport) Will patient be returning to same living situation after discharge?: Yes Currently receiving community mental health services: Yes (From Whom) [name]) Does patient have financial barriers related to discharge medications?: No  Summary/Recommendations:   Summary and Recommendations (to be completed by the evaluator): Patient is a 30  year old male with a diagnosis of Major Depressive Disorder, Recurrent, Severe, with Psychosis. Pt presented to the hospital with depressive sx, AVH, and was not taking his meds. Pt reports primary triggers for admission were stress from his family and just "not feeling like himself". Patient will benefit from crisis stabilization, medication evaluation, group therapy and psycho education in addition to case management for discharge planning. At discharge it is recommended that Pt remain compliant with established discharge plan and continued treatment with [name].   Todd Shaw. 09/15/2015

## 2015-09-15 NOTE — BHH Group Notes (Signed)
B[facility] LCSW Group Therapy  09/15/2015 1:44 PM  Type of Therapy: Group Therapy  Participation Level: Active  Participation Quality: Attentive  Affect: Flat  Cognitive: Oriented  Insight: Limited  Engagement in Therapy: Engaged  Modes of Intervention: Discussion and Socialization  Summary of Progress/Problems: Todd Shaw from [facility] [employer] was here to tell his story of recovery and play his guitar.  Pt was pleasant. Stayed for most of the group but did doze off a few times.   Todd Shaw 09/15/2015 1:44 PM

## 2015-09-15 NOTE — [facility] Group Notes (Signed)
[facility]   09/15/2015 1:29 PM  Participation Quality: Active   Mood/Affect:  Appropriate  Depression Rating: Endorses depressive sx   Anxiety Rating:  Denies   Thoughts of Suicide:  No Will you contract for safety?   NA  Current AVH:  Yes  Plan for Discharge/Comments:  Pt reports that he has been sleeping well and feels rested. Pt explained that he came to the hospital because his depression was starting to become too much. Pt works at a [employer] and is requesting a Physicist, medicalletter as an excuse from work. Will return to mom's house and [facility].   Transportation Means:   Supports:  Jonathon JordanLynn B Bryant

## 2015-09-15 NOTE — B[facility] Suicide Risk Assessment (Signed)
B[facility] INPATIENT:  Family/Significant Other Suicide Prevention Education  Suicide Prevention Education:  Education Completed; Saundra Farr (mom) [phone], has been identified by the patient as the family membRobert Bellower/significant other with whom the patient will be residing, and identified as the person(s) who will aid the patient in the event of a mental health crisis (suicidal ideations/suicide attempt).  With written consent from the patient, the family member/significant other has been provided the following suicide prevention education, prior to the and/or following the discharge of the patient.  The suicide prevention education provided includes the following:  Suicide risk factors  Suicide prevention and interventions  National Suicide Hotline telephone number  [facility] assessment telephone number  [facility] Emergency Assistance 911  [facility] and/or Residential Mobile Crisis Unit telephone number  Request made of family/significant other to:  Remove weapons (e.g., guns, rifles, knives), all items previously/currently identified as safety concern.    Remove drugs/medications (over-the-counter, prescriptions, illicit drugs), all items previously/currently identified as a safety concern.  The family member/significant other verbalizes understanding of the suicide prevention education information provided.  The family member/significant other agrees to remove the items of safety concern listed above.  Pt's mother states that pt cannot return to her home. CSW will look for housing options for pt.  Jonathon JordanLynn B Derron Pipkins 09/15/2015, 3:40 PM

## 2015-09-16 DIAGNOSIS — F333 Major depressive disorder, recurrent, severe with psychotic symptoms: Principal | ICD-10-CM

## 2015-09-16 NOTE — Progress Notes (Signed)
[facility] MD Progress Note  09/16/2015 1:53 PM Boruch Manuele  MRN:  [id number] Subjective:  Patient states " I am a better."  Patient appears in good spirits.  Maintained good eye contact.  He is wanting to know the plan for discharge.  He denies SI HI AVH.  Tolerating meds and no reported ADR's.   Objective:Wilbur Palau is a 30 y.o. AA male, single , employed at a [employer] , lives with parents in [place], who has a past hx of psychosis ( ? Substance induced) who presented as a walk-in to [facility] for psychosis.  Patient seen and chart reviewed.Discussed patient with treatment team.  Patient today with some improvement of his AH, continues to be depressed.Rates his depression at a 7 /10 , 10 being the worst. Pt continues to need encouragement to attend groups , participate in milieu. Per staff - is more visible on the unit . Will continue treatment.   Principal Problem: MDD (major depressive disorder), recurrent, severe, with psychosis (HCC) Diagnosis:   Patient Active Problem List   Diagnosis Date Noted  . MDD (major depressive disorder), recurrent, severe, with psychosis (HCC) [F33.3] 09/14/2015  . Attention deficit hyperactivity disorder (ADHD) [F90.9] 09/13/2015  . Cannabis use disorder, moderate, in sustained remission [F12.90] 09/13/2015  . Tobacco use disorder [F17.200] 09/13/2015   Total Time spent with patient: 25 minutes  Past Psychiatric History: Pt was admitted on 09/11/2008 - diagnosis of substance induced psychosis ( cough pill , cannabis ) . Pt follows up with [name]. Pt at that time responded well to Zyprexa.Pt denies past hx of suicide attempts  Past Medical History:  Past Medical History  Diagnosis Date  . Bipolar 1 disorder (HCC)    History reviewed. No pertinent past surgical history. Family History:  Family History  Problem Relation Age of Onset  . Mental illness Neg Hx    Family Psychiatric  History: Family Psychiatric History: Pt denies hx of mental  illness, substance abuse in family Social History: Pt is single , employed at a Radio broadcast assistant , lives with parents in [place] , denies hx of legal issues.  History  Alcohol Use No     History  Drug Use No    Social History   Social History  . Marital Status: Single    Spouse Name: N/A  . Number of Children: N/A  . Years of Education: N/A   Social History Main Topics  . Smoking status: Current Every Day Smoker -- 1.00 packs/day    Types: Cigarettes  . Smokeless tobacco: None  . Alcohol Use: No  . Drug Use: No  . Sexual Activity: Not Currently   Other Topics Concern  . None   Social History Narrative   Additional Social History:    Pain Medications: See PTA Prescriptions: See PTA -- Pt endorsed Seroquel prescription but said he does not take it Over the Counter: See PTA History of alcohol / drug use?: No history of alcohol / drug abuse    Sleep: Fair  Appetite:  Fair  Current Medications: Current Facility-Administered Medications  Medication Dose Route Frequency Provider Last Rate Last Dose  . acetaminophen (TYLENOL) tablet 650 mg  650 mg Oral Q6H PRN Thermon Leyland, NP      . alum & mag hydroxide-simeth (MAALOX/MYLANTA) 200-200-20 MG/5ML suspension 30 mL  30 mL Oral Q4H PRN Thermon Leyland, NP      . benztropine (COGENTIN) tablet 0.5 mg  0.5 mg Oral BH-qamhs Jomarie Longs, MD  0.5 mg at 09/16/15 0810  . buPROPion (WELLBUTRIN XL) 24 hr tablet 150 mg  150 mg Oral Daily Saramma Eappen, MD   150 mg at 09/16/15 0809  . haloperidol (HALDOL) tablet 5 mg  5 mg Oral BH-qamhs Saramma Eappen, MD   5 mg at 09/16/15 0809  . hydrOXYzine (ATARAX/VISTARIL) tablet 25 mg  25 mg Oral TID PRN Jomarie LongsSaramma Eappen, MD   25 mg at 09/15/15 1810  . LORazepam (ATIVAN) tablet 1 mg  1 mg Oral Q4H PRN Jomarie LongsSaramma Eappen, MD   1 mg at 09/16/15 0940  . magnesium hydroxide (MILK OF MAGNESIA) suspension 30 mL  30 mL Oral Daily PRN Thermon LeylandLaura A Davis, NP      . nicotine polacrilex (NICORETTE) gum 2 mg  2 mg Oral PRN  Jomarie LongsSaramma Eappen, MD      . OLANZapine zydis (ZYPREXA) disintegrating tablet 5 mg  5 mg Oral Q8H PRN Thermon LeylandLaura A Davis, NP      . traZODone (DESYREL) tablet 50 mg  50 mg Oral QHS PRN Thermon LeylandLaura A Davis, NP   50 mg at 09/15/15 2105    Lab Results:  No results found for this or any previous visit (from the past 48 hour(s)).  Blood Alcohol level:  Lab Results  Component Value Date   ETH <5 09/13/2015   [facility]  [facility]    <5        LOWEST DETECTABLE LIMIT FOR SERUM ALCOHOL IS 5 mg/dL FOR MEDICAL PURPOSES ONLY    Physical Findings: AIMS: Facial and Oral Movements Muscles of Facial Expression: None, normal Lips and Perioral Area: None, normal Jaw: None, normal Tongue: None, normal,Extremity Movements Upper (arms, wrists, hands, fingers): None, normal Lower (legs, knees, ankles, toes): None, normal, Trunk Movements Neck, shoulders, hips: None, normal, Overall Severity Severity of abnormal movements (highest score from questions above): None, normal Incapacitation due to abnormal movements: None, normal Patient's awareness of abnormal movements (rate only patient's report): No Awareness, Dental Status Current problems with teeth and/or dentures?: No Does patient usually wear dentures?: No  CIWA:  CIWA-Ar Total: 1 COWS:  COWS Total Score: 2  Musculoskeletal: Strength & Muscle Tone: within normal limits Gait & Station: normal Patient leans: N/A  Psychiatric Specialty Exam: Review of Systems  Psychiatric/Behavioral: Positive for depression and hallucinations. The patient is nervous/anxious.   All other systems reviewed and are negative.   Blood pressure 117/87, pulse 83, temperature 98.4 F (36.9 C), temperature source Oral, resp. rate 20, height 5\' 7"  (1.702 m), weight 79.379 kg (175 lb).Body mass index is 27.4 kg/(m^2).  General Appearance: Fairly Groomed  Patent attorneyye Contact::  Poor  Speech:  Normal Rate  Volume:  Decreased  Mood:  Depressed  Affect:  Depressed  Thought Process:  Goal  Directed  Orientation:  Full (Time, Place, and Person)  Thought Content:  Hallucinations: Auditory and Rumination improving  Suicidal Thoughts:  No  Homicidal Thoughts:  No  Memory:  Immediate;   Fair Recent;   Fair Remote;   Fair  Judgement:  Impaired  Insight:  Shallow  Psychomotor Activity:  Decreased  Concentration:  Poor  Recall:  FiservFair  Fund of Knowledge:Fair  Language: Fair  Akathisia:  No  Handed:  Right  AIMS (if indicated):     Assets:  Desire for Improvement  ADL's:  Intact  Cognition: WNL  Sleep:  Number of Hours: 6.75   Treatment Plan Summary: Patient will benefit from continued treatment. Daily contact with patient to assess and evaluate symptoms and progress  in treatment and Medication management Cont Haldol 5 mg po bid for psychosis. Cont Cogentin 0.5 mg po bid for EPS. Cont Wellbutrin XL to 150  mg po daily for affective sx. Continue Trazodone 50 mg po qhs prn for sleep. Will make available PRN medications as per agitation protocol. Will monitor vitals ,medication compliance and treatment side effects while patient is here.  Will monitor for medical issues as well as call consult as needed.  Will order all routine labs including cbc- wnl , cmp- wnl , tsh- wnl , lipid panel- wnl ,PL - 30 ( will need to monitor on and out patient basis) hba1c- 5.5 , BAL <5 ,UDS- negative , EKG for qtc - wnl. CSW will continue  working on disposition.  Patient to participate in therapeutic milieu .   Lindwood Qua, NP [facility] 09/16/2015, 1:53 PM

## 2015-09-16 NOTE — BHH Group Notes (Signed)

## 2015-09-16 NOTE — BHH Group Notes (Signed)
Adult Psychoeducational Group Note  Date:  09/16/2015 Time:  10:26 PM  Group Topic/Focus:  Wrap-Up Group:   The focus of this group is to help patients review their daily goal of treatment and discuss progress on daily workbooks.  Participation Level:  Active  Participation Quality:  Appropriate  Affect:  Appropriate  Cognitive:  Appropriate  Insight: Good  Engagement in Group:  Engaged  Modes of Intervention:  Discussion  Additional Comments:  Pt rated his day a 7.  He expressed that the medications are working and have balanced him out.  Caroll RancherLindsay, Nekisha Mcdiarmid A 09/16/2015, 10:26 PM

## 2015-09-16 NOTE — Progress Notes (Signed)
D:  Patient's self inventory sheet, patient sleeps good, sleep medication is helpful.  Good appetite, normal energy level, good concentration.  Rated depression and anxiety #6, hopeless #5.  Denied withdrawals.  Denied SI.  Denied physical problems.  Denied pain.  Goal is "being focus".  Plans to "stay positive".  Does have discharge plans. A:  Medications administered per MD orders.  Emotional support and encouragement given patient. R:  Denied SI and HI, contracts for safety.  Denied A/V hallucinations.  Safety maintained with 15 minute checks.

## 2015-09-16 NOTE — Tx Team (Signed)
Interdisciplinary Treatment Plan Update (Adult)  Date:  09/16/2015  Time Reviewed:  2:17 PM   Progress in Treatment: Attending groups: Yes  Participating in groups:  Yes Taking medication as prescribed:  Yes. Tolerating medication:  Yes. Family/Significant othe contact made: Yes  Patient understands diagnosis:  Yes. and As evidenced by:  seeking treatment for AVH, depression, mood instability/bipolar I disorder, and for medication stabilization. Discussing patient identified problems/goals with staff:  Yes. Medical problems stabilized or resolved:  Yes. Denies suicidal/homicidal ideation: Yes. Issues/concerns per patient self-inventory:  Other:  Discharge Plan or Barriers:  CSW assessing for appropriate referrals. Pt reports that he had gone to [facility] for medication management but has hx of medication noncompliance.   Reason for Continuation of Hospitalization: Depression Hallucinations Medication stabilization  Comments:  Todd Shaw is a 30 y.o. male who presented as a walk-in to [facility]. He has a standing diagnosis of Bipolar I. Pt reported that he is not compliant with meds and is experiencing auditory and visual hallucinations related to God. Pt's mood was despondent/depressed, and affect was blunted. Pt had poor eye contact -- he was curled over.Pt was vague on SI -- "I don't know"; was silent was asked about previous suicidal ideation or previous suicide attempts. Denied HI or self-harm. Memory and concentration were poor. Insight and judgment were fair. Pt denied substance use. Pt stated that he is prescribed Seroquel through [facility] but does not take it "because I feel like I was thrown on it."Diagnosis: Bipolar I with psychosis.  3/9: Cont Haldol 5 mg po bid for psychosis. Cont Cogentin 0.5 mg po bid for EPS. Cont Wellbutrin XL to 150 mg po daily for affective sx. Continue Trazodone 50 mg po qhs prn for sleep.  Estimated length of stay:   1-4 days  New goal(s): to develop effective aftercare plan.   Additional Comments:  Patient and CSW reviewed pt's identified goals and treatment plan. Patient verbalized understanding and agreed to treatment plan. CSW reviewed [facility] "Discharge Process and Patient Involvement" Form. Pt verbalized understanding of information provided and signed form.    Review of initial/current patient goals per problem list:  1. Goal(s): Patient will participate in aftercare plan  Met: Yes  Target date: at discharge  As evidenced by: Patient will participate within aftercare plan AEB aftercare provider and housing plan at discharge being identified. 3/7: CSW assessing for appropriate referrals.  09/16/15: States he will stay with a friend, follow up [name]  2. Goal (s): Patient will exhibit decreased depressive symptoms and suicidal ideations.  Met: No.    Target date: at discharge  As evidenced by: Patient will utilize self rating of depression at 3 or below and demonstrate decreased signs of depression or be deemed stable for discharge by MD.  3/7: Pt rates depression as high. Denies SI/HI this morning.  09/16/15:  Rates his depression a 7 today  3. Goal(s): Patient will demonstrate decreased signs and symptoms of psychosis.   Met:Yes  Target date: at discharge  As evidenced by: Patient will demonstrateddecreased signs of psychosis, or be deemed stable for discharge by MD.  3/7: Pt reports continuing AVH relating to God, poor memory and concentration. 09/16/15:  No signs nor symptoms of psychosis today  Attendees: Patient:   09/16/2015 2:17 PM   Family:   09/16/2015 2:17 PM   Physician:  Dr. Carlton Adam, MD 09/16/2015 2:17 PM   Nursing:   Maurice Small RN 09/16/2015 2:17 PM   Clinical Social Worker:  [id number], LCSW 09/16/2015 2:17 PM   Clinical Social Worker: Erasmo Downer Drinkard LCSW; Peri Maris LCSWA 09/16/2015 2:17 PM   Other:  Gerline Legacy Nurse Case Manager 09/16/2015 2:17 PM    Other:   09/16/2015 2:17 PM   Other:   09/16/2015 2:17 PM   Other:  09/16/2015 2:17 PM   Other:  09/16/2015 2:17 PM   Other:  09/16/2015 2:17 PM    09/16/2015 2:17 PM    09/16/2015 2:17 PM    09/16/2015 2:17 PM    09/16/2015 2:17 PM    Scribe for Treatment Team:   Maxie Better, LCSW 09/16/2015 2:17 PM

## 2015-09-16 NOTE — Plan of Care (Signed)
Problem: Consults Goal: Depression Patient Education See Patient Education Module for education specifics.  Outcome: Progressing Nurse discussed depression/coping skills with patient.        

## 2015-09-16 NOTE — BHH Group Notes (Signed)
BHH Group Notes:  (Counselor/Nursing/MHT/Case Management/Adjunct)  09/16/2015 1:15PM  Type of Therapy:  Group Therapy  Participation Level:  Active  Participation Quality:  Appropriate  Affect:  Flat  Cognitive:  Oriented  Insight:  Improving  Engagement in Group:  Limited  Engagement in Therapy:  Limited  Modes of Intervention:  Discussion, Exploration and Socialization  Summary of Progress/Problems: The topic for group was balance in life.  Pt participated in the discussion about when their life was in balance and out of balance and how this feels.  Pt discussed ways to get back in balance and short term goals they can work on to get where they want to be. "I play music to stay balanced.  Working out is good for me.  I love to work, too.  I hope to find a better a paying job.  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   Todd Geraldorth, Todd Shaw 09/16/2015 1:43 PM

## 2015-09-17 MED ORDER — HYDROXYZINE HCL 25 MG PO TABS: 25.0000 mg | ORAL_TABLET | Freq: Three times a day (TID) | ORAL | Status: DC | PRN

## 2015-09-17 MED ORDER — BENZTROPINE MESYLATE 0.5 MG PO TABS: 0.5000 mg | ORAL_TABLET | ORAL | Status: DC

## 2015-09-17 MED ORDER — HALOPERIDOL 5 MG PO TABS: 5.0000 mg | ORAL_TABLET | ORAL | Status: DC

## 2015-09-17 MED ORDER — TRAZODONE HCL 50 MG PO TABS: 50.0000 mg | ORAL_TABLET | Freq: Every evening | ORAL | Status: DC | PRN

## 2015-09-17 MED ORDER — BUPROPION HCL ER (XL) 150 MG PO TB24: 150.0000 mg | ORAL_TABLET | Freq: Every day | ORAL | Status: DC

## 2015-09-17 NOTE — Tx Team (Signed)
Interdisciplinary Treatment Plan Update (Adult)  Date:  09/17/2015  Time Reviewed:  10:32 AM   Progress in Treatment: Attending groups: Yes  Participating in groups:  Yes Taking medication as prescribed:  Yes. Tolerating medication:  Yes. Family/Significant othe contact made: Yes  Patient understands diagnosis:  Yes. and As evidenced by:  seeking treatment for AVH, depression, mood instability/bipolar I disorder, and for medication stabilization. Discussing patient identified problems/goals with staff:  Yes. Medical problems stabilized or resolved:  Yes. Denies suicidal/homicidal ideation: Yes. Issues/concerns per patient self-inventory:  Other:  Discharge Plan or Barriers:  CSW assessing for appropriate referrals. Pt reports that he had gone to [facility] for medication management but has hx of medication noncompliance.   Reason for Continuation of Hospitalization: Depression Hallucinations Medication stabilization  Comments:  Todd Shaw is a 30 y.o. male who presented as a walk-in to [facility]. He has a standing diagnosis of Bipolar I. Pt reported that he is not compliant with meds and is experiencing auditory and visual hallucinations related to God. Pt's mood was despondent/depressed, and affect was blunted. Pt had poor eye contact -- he was curled over.Pt was vague on SI -- "I don't know"; was silent was asked about previous suicidal ideation or previous suicide attempts. Denied HI or self-harm. Memory and concentration were poor. Insight and judgment were fair. Pt denied substance use. Pt stated that he is prescribed Seroquel through [facility] but does not take it "because I feel like I was thrown on it."Diagnosis: Bipolar I with psychosis.  3/9: Cont Haldol 5 mg po bid for psychosis. Cont Cogentin 0.5 mg po bid for EPS. Cont Wellbutrin XL to 150 mg po daily for affective sx. Continue Trazodone 50 mg po qhs prn for sleep.  Estimated length of stay:   D/C today  New goal(s): to develop effective aftercare plan.   Additional Comments:  Patient and CSW reviewed pt's identified goals and treatment plan. Patient verbalized understanding and agreed to treatment plan. CSW reviewed [facility] "Discharge Process and Patient Involvement" Form. Pt verbalized understanding of information provided and signed form.    Review of initial/current patient goals per problem list:  1. Goal(s): Patient will participate in aftercare plan  Met: Yes  Target date: at discharge  As evidenced by: Patient will participate within aftercare plan AEB aftercare provider and housing plan at discharge being identified. 3/7: CSW assessing for appropriate referrals.  09/16/15: States he will stay with a friend, follow up [name]  2. Goal (s): Patient will exhibit decreased depressive symptoms and suicidal ideations.  Met: Yes   Target date: at discharge  As evidenced by: Patient will utilize self rating of depression at 3 or below and demonstrate decreased signs of depression or be deemed stable for discharge by MD.  3/7: Pt rates depression as high. Denies SI/HI this morning.  09/16/15:  Rates his depression a 7 today 09/17/15:  Rates his depression a 2 today  3. Goal(s): Patient will demonstrate decreased signs and symptoms of psychosis.   Met:Yes  Target date: at discharge  As evidenced by: Patient will demonstrateddecreased signs of psychosis, or be deemed stable for discharge by MD.  3/7: Pt reports continuing AVH relating to God, poor memory and concentration. 09/16/15:  No signs nor symptoms of psychosis today  Attendees: Patient:   09/17/2015 10:32 AM   Family:   09/17/2015 10:32 AM   Physician:  Dr. Carlton Adam, MD 09/17/2015 10:32 AM   Nursing:   Maurice Small RN 09/17/2015  10:32 AM   Clinical Social Worker: [place], LCSW  09/17/2015 10:32 AM    09/17/2015 10:32 AM   Other:  Gerline Legacy Nurse Case Manager 09/17/2015 10:32 AM   Other:    09/17/2015 10:32 AM   Other:   09/17/2015 10:32 AM   Other:  09/17/2015 10:32 AM   Other:  09/17/2015 10:32 AM   Other:  09/17/2015 10:32 AM    09/17/2015 10:32 AM    09/17/2015 10:32 AM    09/17/2015 10:32 AM    09/17/2015 10:32 AM    Scribe for Treatment Team:   Maxie Better, LCSW 09/17/2015 10:32 AM

## 2015-09-17 NOTE — Progress Notes (Signed)
``    Todd Shaw is seen OOB [facility] on the [address] hall today.Marland Kitchen.and he tolerates this well. He smiles broadly, makes good eye contqct and says " I'm great..I'm going home today". A  He takes his meds as scheduled and he completed his daily assessment and on it he wrote he denied SI today  and he rated his depression, hopelessness and anxiety " 0/0/0/", respectively. R  Safety is in place. DC impending.

## 2015-09-17 NOTE — Progress Notes (Signed)
D: Pt denies any form of depression, anxiety, pain, SI, HI or AVH; he states, "between me playing the piano and the medicine I'm getting here, I feel very good; I hope I will be allowed to go home on Friday." Pt was having appropriate conversations with peers. Pt remained calm and cooperative through the shift assessment. A: Medications offered as prescribed.  Support, encouragement, and safe environment provided.  15-minute safety checks continue. R: Pt was med compliant.  Pt attended group. Safety checks continue

## 2015-09-17 NOTE — BHH Suicide Risk Assessment (Signed)
[facility]   Principal Problem: MDD (major depressive disorder), recurrent, severe, with psychosis (HCC) Discharge Diagnoses:  Patient Active Problem List   Diagnosis Date Noted  . MDD (major depressive disorder), recurrent, severe, with psychosis (HCC) [F33.3] 09/14/2015  . Attention deficit hyperactivity disorder (ADHD) [F90.9] 09/13/2015  . Cannabis use disorder, moderate, in sustained remission [F12.90] 09/13/2015  . Tobacco use disorder [F17.200] 09/13/2015    Total Time spent with patient: 20 minutes  Musculoskeletal: Strength & Muscle Tone: within normal limits Gait & Station: normal Patient leans: normal  Psychiatric Specialty Exam: Review of Systems  Constitutional: Negative.   HENT: Negative.   Eyes: Negative.   Respiratory: Negative.   Cardiovascular: Negative.   Gastrointestinal: Negative.   Genitourinary: Negative.   Musculoskeletal: Negative.   Skin: Negative.   Neurological: Negative.   Endo/Heme/Allergies: Negative.   Psychiatric/Behavioral: Positive for depression.    Blood pressure 113/70, pulse 139, temperature 97.5 F (36.4 C), temperature source Oral, resp. rate 20, height 5\' 7"  (1.702 m), weight 79.379 kg (175 lb).Body mass index is 27.4 kg/(m^2).  General Appearance: Fairly Groomed  Patent attorneyye Contact::  Fair  Speech:  Clear and Coherent409  Volume:  Normal  Mood:  Euthymic  Affect:  Appropriate  Thought Process:  Coherent and Goal Directed  Orientation:  Full (Time, Place, and Person)  Thought Content:  plans as he moves on  Suicidal Thoughts:  No  Homicidal Thoughts:  No  Memory:  Immediate;   Fair Recent;   Fair Remote;   Fair  Judgement:  Fair  Insight:  Present and Shallow  Psychomotor Activity:  Normal  Concentration:  Fair  Recall:  FiservFair  Fund of Knowledge:Fair  Language: Fair  Akathisia:  No  Handed:  Left  AIMS (if indicated):     Assets:  Desire for Improvement Housing Social  Support Vocational/Educational  Sleep:  Number of Hours: 6.75  Cognition: WNL  ADL's:  Intact  In full contact with reality. There are no active SI plans or intent. He feels these new medications are working better for him. Will follow up outpatient basis Mental Status Per Nursing Assessment::   On Admission:     Demographic Factors:  Male  Loss Factors: none identified  Historical Factors: none identified  Risk Reduction Factors:   Religious beliefs about death, Employed, Living with another person, especially a relative and Positive social support  Continued Clinical Symptoms:  Depression:   Impulsivity  Cognitive Features That Contribute To Risk:  None    Suicide Risk:  Minimal: No identifiable suicidal ideation.  Patients presenting with no risk factors but with morbid ruminations; may be classified as minimal risk based on the severity of the depressive symptoms  Follow-up Information    [facility].   Specialty:  Behavioral Health   Why:  @1 :25 pm with Dr. June LeapMengistu for med management. If you need to be seen before that, go to the walk-in clinic between 8 and 11AM    Contact information:   [address] [place] [address] [phone]       Plan Of Care/Follow-up recommendations:  Activity:  as tolerated Diet:  regular Follow up as above Jurni Cesaro A, MD 09/17/2015, 11:48 AM

## 2015-09-17 NOTE — Progress Notes (Signed)
  [facility] Discharge Plan :  Will you be returning to the same living situation after discharge:  No. Will stay with friend At discharge, do you have transportation home?: Yes,  mother Do you have the ability to pay for your medications: Yes,  mentl health  Release of information consent forms completed and in the chart;  Patient's signature needed at discharge.  Patient to Follow up at: Follow-up Information    [facility] Go on 10/13/2015.   Specialty:  Behavioral Health   Why:  @1 :25 pm with Dr. June LeapMengistu for med management. If you need to be seen before that, go to the walk-in clinic between 8 and 11AM    Contact information:   [address] [place] [address] [phone]       Next level of care provider has access to [facility] Link:no  Safety Planning and Suicide Prevention discussed: Yes,  yes  Have you used any form of tobacco in the last 30 days? (Cigarettes, Smokeless Tobacco, Cigars, and/or Pipes): Yes  Has patient been referred to the Quitline?: Patient refused referral  Patient has been referred for addiction treatment: N/A  Ida Rogueorth, Todd Shaw 09/17/2015, 10:35 AM

## 2015-09-17 NOTE — Progress Notes (Signed)
Patient  discharged home with samples and prescriptions. Patient was stable and appreciative at that time. All papers and prescriptions were given and valuables returned. Verbal understanding expressed. Denies SI/HI and A/VH. Patient given opportunity to express concerns and ask questions.  

## 2015-09-17 NOTE — Discharge Summary (Signed)
Physician Discharge Summary Note  Patient:  Todd Shaw is an 30 y.o., male MRN:  [id number] DOB:  1986-04-16 Patient phone:  [phone] (home)  Patient address:   [address] [place] [address],  Total Time spent with patient: 30 minutes  Date of Admission:  09/13/2015 Date of Discharge: 09/17/2015   Reason for Admission:  depression  Principal Problem: MDD (major depressive disorder), recurrent, severe, with psychosis [facility]) Discharge Diagnoses: Patient Active Problem List   Diagnosis Date Noted  . MDD (major depressive disorder), recurrent, severe, with psychosis (HCC) [F33.3] 09/14/2015  . Attention deficit hyperactivity disorder (ADHD) [F90.9] 09/13/2015  . Cannabis use disorder, moderate, in sustained remission [F12.90] 09/13/2015  . Tobacco use disorder [F17.200] 09/13/2015    Past Psychiatric History:  See above noted  Past Medical History:  Past Medical History  Diagnosis Date  . Bipolar 1 disorder (HCC)    History reviewed. No pertinent past surgical history. Family History:  Family History  Problem Relation Age of Onset  . Mental illness Neg Hx    Family Psychiatric  History:  See above noted Social History:  History  Alcohol Use No     History  Drug Use No    Social History   Social History  . Marital Status: Single    Spouse Name: N/A  . Number of Children: N/A  . Years of Education: N/A   Social History Main Topics  . Smoking status: Current Every Day Smoker -- 1.00 packs/day    Types: Cigarettes  . Smokeless tobacco: None  . Alcohol Use: No  . Drug Use: No  . Sexual Activity: Not Currently   Other Topics Concern  . None   Social History Narrative    Hospital Course:  Todd Shaw is a 30 y.o. AA male, single , employed at a [employer] , lives with parents in [place], who has a past hx of psychosis ( ? Substance induced) who presented as a walk-in to [facility] for psychosis.   Todd Shaw was admitted for MDD  (major depressive disorder), recurrent, severe, with psychosis (HCC) and crisis management.  He was treated with the following medications, Haldol 5 mg for psychosis, Cogentin 0.5 mg for EPS, Wellbutrin XL to 150 mg daily for affective, Trazodone 50 mg as needed for sleep and available PRN medications as per agitation protocol. Todd Shaw was discharged with current medication and was instructed on how to take medications as prescribed; (details listed below under Medication List).  Medical problems were identified and treated as needed.  Home medications were restarted as appropriate.  Improvement was monitored by observation and Todd Shaw daily report of symptom reduction.  Emotional and mental status was monitored by daily self-inventory reports completed by Todd Shaw and clinical staff.         Todd Shaw was evaluated by the treatment team for stability and plans for continued recovery upon discharge.  Todd Shaw motivation was an integral factor for scheduling further treatment.  Employment, transportation, bed availability, health status, family support, and any pending legal issues were also considered during his hospital stay.  He was offered further treatment options upon discharge including but not limited to Residential, Intensive Outpatient, and Outpatient treatment.  Todd Shaw will follow up with the services as listed below under Follow Up Information.     Upon completion of this admission the Todd Shaw was both mentally and medically stable for discharge denying suicidal/homicidal ideation, auditory/visual/tactile hallucinations, delusional thoughts  and paranoia.     Physical Findings: AIMS: Facial and Oral Movements Muscles of Facial Expression: None, normal Lips and Perioral Area: None, normal Jaw: None, normal Tongue: None, normal,Extremity Movements Upper (arms, wrists, hands, fingers): None, normal Lower (legs, knees, ankles, toes): None, normal, Trunk Movements Neck,  shoulders, hips: None, normal, Overall Severity Severity of abnormal movements (highest score from questions above): None, normal Incapacitation due to abnormal movements: None, normal Patient's awareness of abnormal movements (rate only patient's report): No Awareness, Dental Status Current problems with teeth and/or dentures?: No Does patient usually wear dentures?: No  CIWA:  CIWA-Ar Total: 1 COWS:  COWS Total Score: 2  Musculoskeletal: Strength & Muscle Tone: within normal limits Gait & Station: normal Patient leans: N/A  Psychiatric Specialty Exam: Review of Systems  All other systems reviewed and are negative.   Blood pressure 113/70, pulse 139, temperature 97.5 F (36.4 C), temperature source Oral, resp. rate 20, height  (1.702 m), weight 79.379 kg (175 lb).Body mass index is 27.4 kg/(m^2).  Have you used any form of tobacco in the last 30 days? (Cigarettes, Smokeless Tobacco, Cigars, and/or Pipes): Yes  Has this patient used any form of tobacco in the last 30 days? (Cigarettes, Smokeless Tobacco, Cigars, and/or Pipes) Yes, NA  Blood Alcohol level:  Lab Results  Component Value Date   ETH <5 09/13/2015   ETH  09/11/2008    <5        LOWEST DETECTABLE LIMIT FOR SERUM ALCOHOL IS 5 mg/dL FOR MEDICAL PURPOSES ONLY    Metabolic Disorder Labs:  Lab Results  Component Value Date   HGBA1C 5.5 09/13/2015   MPG 111 09/13/2015   Lab Results  Component Value Date   PROLACTIN 30.8* 09/13/2015   Lab Results  Component Value Date   CHOL 141 09/14/2015   TRIG 86 09/14/2015   HDL 40* 09/14/2015   CHOLHDL 3.5 09/14/2015   VLDL 17 09/14/2015   LDLCALC 84 09/14/2015    See Psychiatric Specialty Exam and Suicide Risk Assessment completed by Attending Physician prior to discharge.  Discharge destination:  Home  Is patient on multiple antipsychotic therapies at discharge:  No   Has Patient had three or more failed trials of antipsychotic monotherapy by history:   No  Recommended Plan for Multiple Antipsychotic Therapies: NA     Medication List    STOP taking these medications        cyclobenzaprine 10 MG tablet  Commonly known as:  FLEXERIL     HYDROcodone-acetaminophen 5-325 MG tablet  Commonly known as:  NORCO     ibuprofen 200 MG tablet  Commonly known as:  ADVIL,MOTRIN     methocarbamol 500 MG tablet  Commonly known as:  ROBAXIN     naproxen 500 MG tablet  Commonly known as:  NAPROSYN     QUEtiapine 300 MG tablet  Commonly known as:  SEROQUEL      TAKE these medications      Indication   benztropine 0.5 MG tablet  Commonly known as:  COGENTIN  Take 1 tablet (0.5 mg total) by mouth 2 (two) times daily in the am and at bedtime..   Indication:  Extrapyramidal Reaction caused by Medications     buPROPion 150 MG 24 hr tablet  Commonly known as:  WELLBUTRIN XL  Take 1 tablet (150 mg total) by mouth daily.   Indication:  Major Depressive Disorder     guaiFENesin 100 MG/5ML liquid  Commonly known as:  ROBITUSSIN  Take 200 mg by mouth 3 (three) times daily as needed for cough.      haloperidol 5 MG tablet  Commonly known as:  HALDOL  Take 1 tablet (5 mg total) by mouth 2 (two) times daily in the am and at bedtime..   Indication:  Psychosis     hydrOXYzine 25 MG tablet  Commonly known as:  ATARAX/VISTARIL  Take 1 tablet (25 mg total) by mouth 3 (three) times daily as needed for anxiety.   Indication:  Anxiety Neurosis     traZODone 50 MG tablet  Commonly known as:  DESYREL  Take 1 tablet (50 mg total) by mouth at bedtime as needed for sleep.   Indication:  Trouble Sleeping       Follow-up Information    [facility].   Specialty:  Behavioral Health   Why:  @1 :25 pm with Dr. June LeapMengistu for med management. If you need to be seen before that, go to the walk-in clinic between 8 and 11AM    Contact information:   [address] [place] [address] [phone]       Follow-up  recommendations:  Activity:  as tol Diet:  as tol  Comments:  1.  Take all your medications as prescribed.              2.  Report any adverse side effects to outpatient provider.                       3.  Patient instructed to not use alcohol or illegal drugs while on prescription medicines.            4.  In the event of worsening symptoms, instructed patient to call 911, the crisis hotline or go to nearest emergency room for evaluation of symptoms.  Signed: Lindwood QuaSheila May Agustin, NP [facility] 09/17/2015, 12:33 PM  I personally assessed the patient and formulated the plan Madie RenoIrving A. Dub MikesLugo, M.D.

## 2015-09-18 ENCOUNTER — Emergency Department (HOSPITAL_COMMUNITY)
Admission: EM | Admit: 2015-09-18 | Discharge: 2015-09-20 | Disposition: A | Discharge status: Other Acute Inpatient Hospital | Payer: No Typology Code available for payment source | Attending: Emergency Medicine | Admitting: Emergency Medicine

## 2015-09-18 ENCOUNTER — Encounter (HOSPITAL_COMMUNITY): Payer: Self-pay | Admitting: Emergency Medicine

## 2015-09-18 ENCOUNTER — Ambulatory Visit (HOSPITAL_COMMUNITY)
Admission: RE | Admit: 2015-09-18 | Discharge: 2015-09-18 | Disposition: A | Discharge status: Home / Self Care | Payer: Federal, State, Local not specified - Other | Attending: Psychiatry | Admitting: Psychiatry

## 2015-09-18 DIAGNOSIS — Z79899 Other long term (current) drug therapy: Secondary | ICD-10-CM | POA: Insufficient documentation

## 2015-09-18 DIAGNOSIS — F319 Bipolar disorder, unspecified: Secondary | ICD-10-CM | POA: Diagnosis present

## 2015-09-18 DIAGNOSIS — F17210 Nicotine dependence, cigarettes, uncomplicated: Secondary | ICD-10-CM | POA: Insufficient documentation

## 2015-09-18 DIAGNOSIS — F333 Major depressive disorder, recurrent, severe with psychotic symptoms: Secondary | ICD-10-CM | POA: Insufficient documentation

## 2015-09-18 DIAGNOSIS — F419 Anxiety disorder, unspecified: Secondary | ICD-10-CM | POA: Insufficient documentation

## 2015-09-18 DIAGNOSIS — F312 Bipolar disorder, current episode manic severe with psychotic features: Secondary | ICD-10-CM

## 2015-09-18 DIAGNOSIS — R440 Auditory hallucinations: Secondary | ICD-10-CM

## 2015-09-18 LAB — COMPREHENSIVE METABOLIC PANEL
ALK PHOS: 54 U/L (ref 38–126)
ALT: 29 U/L (ref 17–63)
AST: 62 U/L — AB (ref 15–41)
Albumin: 4.9 g/dL (ref 3.5–5.0)
Anion gap: 12 (ref 5–15)
BUN: 9 mg/dL (ref 6–20)
CALCIUM: 9.3 mg/dL (ref 8.9–10.3)
CO2: 24 mmol/L (ref 22–32)
CREATININE: 0.85 mg/dL (ref 0.61–1.24)
Chloride: 96 mmol/L — ABNORMAL LOW (ref 101–111)
GFR calc non Af Amer: >60 mL/min (ref >60)
GLUCOSE: 109 mg/dL — AB (ref 65–99)
Potassium: 3.5 mmol/L (ref 3.5–5.1)
SODIUM: 132 mmol/L — AB (ref 135–145)
Total Bilirubin: 0.9 mg/dL (ref 0.3–1.2)
Total Protein: 7.9 g/dL (ref 6.5–8.1)

## 2015-09-18 LAB — CBC
HCT: 42.6 % (ref 39.0–52.0)
Hemoglobin: 15.1 g/dL (ref 13.0–17.0)
MCH: 30.8 pg (ref 26.0–34.0)
MCHC: 35.4 g/dL (ref 30.0–36.0)
MCV: 86.8 fL (ref 78.0–100.0)
PLATELETS: 279 10*3/uL (ref 150–400)
RBC: 4.91 MIL/uL (ref 4.22–5.81)
RDW: 13.7 % (ref 11.5–15.5)
WBC: 10.5 10*3/uL (ref 4.0–10.5)

## 2015-09-18 LAB — ETHANOL: Alcohol, Ethyl (B): <5 mg/dL (ref <5)

## 2015-09-18 MED ORDER — GUAIFENESIN 100 MG/5ML PO SOLN
200.0000 mg | Freq: Three times a day (TID) | ORAL | Status: DC | PRN
  Filled 2015-09-18: qty 10

## 2015-09-18 MED ORDER — BENZTROPINE MESYLATE 1 MG PO TABS
0.5000 mg | ORAL_TABLET | ORAL | Status: DC
  Administered 2015-09-19: 0.5 mg via ORAL
  Filled 2015-09-18: qty 1

## 2015-09-18 MED ORDER — TRAZODONE HCL 50 MG PO TABS
50.0000 mg | ORAL_TABLET | Freq: Every evening | ORAL | Status: DC | PRN
  Administered 2015-09-18: 50 mg via ORAL
  Filled 2015-09-18: qty 1

## 2015-09-18 MED ORDER — HYDROXYZINE HCL 25 MG PO TABS
25.0000 mg | ORAL_TABLET | Freq: Three times a day (TID) | ORAL | Status: DC | PRN
  Administered 2015-09-18: 25 mg via ORAL
  Filled 2015-09-18: qty 1

## 2015-09-18 MED ORDER — BUPROPION HCL ER (XL) 150 MG PO TB24
150.0000 mg | ORAL_TABLET | Freq: Every day | ORAL | Status: DC
  Administered 2015-09-19: 150 mg via ORAL
  Filled 2015-09-18: qty 1

## 2015-09-18 MED ORDER — HALOPERIDOL 5 MG PO TABS
5.0000 mg | ORAL_TABLET | ORAL | Status: DC
  Administered 2015-09-19: 5 mg via ORAL
  Filled 2015-09-18: qty 1

## 2015-09-18 NOTE — ED Notes (Signed)
Patient has been wanded by security.  

## 2015-09-18 NOTE — ED Notes (Signed)
Pt's sister states [facility]. According to pt's sister, today he is "fidgety" and hasn't had much of an appetite. Pt denies SI and HI, but he is experiencing both auditory and visual hallucinations. Pt states "somethimes I can turn everything off and that helps me focus, but I can't do that now". Pt does not appear to be responding to any sort of external stimuli. He is calm and cooperative upon assessment.

## 2015-09-18 NOTE — ED Notes (Signed)
Pt. To [facility] from ED ambulatory without difficulty, to room 34 . Report from Amy RN. Pt. Is alert and oriented, warm and dry in no acute distress. Pt. Denies SI, HI, and VH. Pt. States he hears voices without command. Pt. anxious but cooperative. Pt. Made aware of security cameras and Q15 minute rounds. Pt. Encouraged to let Nursing staff know of any concerns or needs.

## 2015-09-18 NOTE — BH Assessment (Addendum)
Assessment Note  Todd Shaw is an 30 y.o. male. presenting voluntarily to [facility] for assessment and accompanied by his sister Todd Shaw [phone], [phone]). Pt verbally consented to the release and exchange of information with sister Todd Shaw). Pt mood was helpless, sad and empty with congruent affect. Pt was oriented with unimpaired judgement. Pt exhibited fair insight and seemed to struggle with properly communicating/explaining symptoms experienced.   Pt has previous dx (per chart) of Bipolar I, ADHD, Cannabis Use, and MDD w/psychosis. Pt was discharged from [facility] on yesterday (09/17/15) after being admitted with dx of MDD, severe, recurrent w/psychosis. Pt states "I thought I was well but I wasn't all the way well". Pt denies SI, HI and thoughts of harm. Pt reports AVH and increased paranoia. Pt endorses the following symptoms of depression- difficulty sleeping, isolation, tearfulness, insomnia, feelings of worthlessness, fatigue and difficulty getting out of the bed. Pt reports that he is currently followed by [facility] and is compliant with medications. Pt reports no SA and UDS was clear on previous [facility] admission (3.6.17).    The following is collateral information obtained from client's sister:  Pt appeared "fine" upon discharge from [facility] (3.10.17). Pt did not "sound like himself" upon speaking to sister earlier in the day (3.11.17). Pt was stuttering and appeared nervous and fidgety. Pt continued to reference that people were trying to get him and that he did not want anything to happen to him. Pt also had no appetite with is atypical of pt. Sister believes pt to be compliant with medications at this time.   Diagnosis: MDD, Recurrent Severe w/ psychosis, Biploar 1 (per chart)  Past Medical History:  Past Medical History  Diagnosis Date  . Bipolar 1 disorder (HCC)     No past surgical history on file.  Family History:  Family History  Problem Relation Age of Onset  . Mental  illness Neg Hx     Social History:  reports that he has been smoking Cigarettes.  He has been smoking about 1.00 pack per day. He does not have any smokeless tobacco history on file. He reports that he does not drink alcohol or use illicit drugs.  Additional Social History:  Alcohol / Drug Use Pain Medications: None Reported Prescriptions: Pt reports current compliance with medications Over the Counter: None Reported History of alcohol / drug use?: No history of alcohol / drug abuse  CIWA:   COWS:    Allergies: No Known Allergies  Home Medications:  (Not in a hospital admission)  OB/GYN Status:  No LMP for male patient.  General Assessment Data Location of Assessment: [facility] Assessment Services TTS Assessment: In system Is this a Tele or Face-to-Face Assessment?: Face-to-Face Is this an Initial Assessment or a Re-assessment for this encounter?: Initial Assessment Marital status: Single Is patient pregnant?: No Pregnancy Status: No Living Arrangements: Other (Comment) (Homeless) Can pt return to current living arrangement?:  (Pt HOmeless) Admission Status: Voluntary Is patient capable of signing voluntary admission?: Yes Referral Source: Self/Family/Friend Insurance type: Medicaid  Medical Screening Exam [facility] Walk-in ONLY) Medical Exam completed: No Reason for MSE not completed: Other: (Pt being transferred to [facility]  for med clearance)  Crisis Care Plan Living Arrangements: Other (Comment) (Homeless) Name of Psychiatrist: [employer], [employer] Name of Therapist: [facility]  Education Status Is patient currently in school?: No Highest grade of school patient has completed: Some Chief Executive Officer person: NA  Risk to self with the past 6 months Suicidal Ideation: No Has patient been a risk to  self within the past 6 months prior to admission? : No Suicidal Intent: No Has patient had any suicidal intent within the past 6 months prior to admission? : No Is patient at risk for suicide?:  No Suicidal Plan?: No Has patient had any suicidal plan within the past 6 months prior to admission? : No Access to Means: No What has been your use of drugs/alcohol within the last 12 months?: None Reported Previous Attempts/Gestures: No Other Self Harm Risks: None Reported Intentional Self Injurious Behavior: None Family Suicide History: Unknown Recent stressful life event(s): Other (Comment) (Depression, coping with hallucinations) Persecutory voices/beliefs?: Yes Depression: Yes Depression Symptoms: Insomnia, Tearfulness, Isolating, Fatigue, Feeling worthless/self pity, Loss of interest in usual pleasures Substance abuse history and/or treatment for substance abuse?: No (None Reported) Suicide prevention information given to non-admitted patients: Not applicable  Risk to Others within the past 6 months Homicidal Ideation: No Does patient have any lifetime risk of violence toward others beyond the six months prior to admission? : No Thoughts of Harm to Others: No Current Homicidal Intent: No Current Homicidal Plan: No Access to Homicidal Means: No History of harm to others?: No Assessment of Violence: None Noted Does patient have access to weapons?: No Criminal Charges Pending?: No Does patient have a court date: No Is patient on probation?: No  Psychosis Hallucinations: Auditory, Visual Delusions: Persecutory  Mental Status Report Appearance/Hygiene: Unremarkable Eye Contact: Poor Motor Activity: Unremarkable Speech: Soft, Logical/coherent Level of Consciousness: Quiet/awake Mood: Helpless, Sad, Empty Affect: Appropriate to circumstance Anxiety Level: Moderate Thought Processes: Coherent, Relevant Judgement: Unimpaired Orientation: Person, Place, Time, Situation, Appropriate for developmental age Obsessive Compulsive Thoughts/Behaviors: None  Cognitive Functioning Concentration: Fair Memory: Recent Intact, Remote Intact IQ: Average Insight: Fair Impulse  Control: Good Appetite: Poor Weight Loss: 0 Weight Gain:  (pt reports weight gain but is unable to quantify amount) Sleep: Decreased Total Hours of Sleep: 5 Vegetative Symptoms: Staying in bed  ADLScreening [facility] Assessment Services) Patient's cognitive ability adequate to safely complete daily activities?: Yes Patient able to express need for assistance with ADLs?: Yes Independently performs ADLs?: Yes (appropriate for developmental age)  Prior Inpatient Therapy Prior Inpatient Therapy: Yes Prior Therapy Dates: 2010, 09/13/15-09/17/15 Prior Therapy Facilty/Provider(s): [facility] Reason for Treatment: Depression w/ psychosis  Prior Outpatient Therapy Prior Outpatient Therapy: Yes Prior Therapy Dates: Ongoing Prior Therapy Facilty/Provider(s): [name] Reason for Treatment: MDD, Bipolar I Does patient have an ACCT team?: No Does patient have Intensive In-House Services?  : No Does patient have [name] services? : Yes Does patient have P4CC services?: No  ADL Screening (condition at time of admission) Patient's cognitive ability adequate to safely complete daily activities?: Yes Is the patient deaf or have difficulty hearing?: No Does the patient have difficulty seeing, even when wearing glasses/contacts?: Yes (Vision difficulty in left eye) Does the patient have difficulty concentrating, remembering, or making decisions?: Yes Patient able to express need for assistance with ADLs?: Yes Does the patient have difficulty dressing or bathing?: No Independently performs ADLs?: Yes (appropriate for developmental age) Does the patient have difficulty walking or climbing stairs?: No Weakness of Legs: None Weakness of Arms/Hands: None  Home Assistive Devices/Equipment Home Assistive Devices/Equipment: None  Therapy Consults (therapy consults require a physician order) PT Evaluation Needed: No OT Evalulation Needed: No SLP Evaluation Needed: No Abuse/Neglect Assessment (Assessment to be  complete while patient is alone) Physical Abuse: Denies Verbal Abuse: Denies Sexual Abuse: Denies Exploitation of patient/patient's resources: Denies Self-Neglect: Denies Values / Beliefs Cultural Requests During Hospitalization:  None Spiritual Requests During Hospitalization: None Consults Spiritual Care Consult Needed: No Social Work Consult Needed: No Merchant navy officerAdvance Directives (For Healthcare) Does patient have an advance directive?: No Would patient like information on creating an advanced directive?: No - patient declined information    Additional Information 1:1 In Past 12 Months?: No CIRT Risk: No Elopement Risk: No Does patient have medical clearance?: No     Disposition: Per Maryjean Mornharles Kober, PA pt [facility] and to be held overnight for AM psych eval.  Disposition Initial Assessment Completed for this Encounter: Yes Disposition of Patient: Other dispositions (AM Psych Eval)  On Site Evaluation by:   Reviewed with Physician:    Necia Kamm J [place] 09/18/2015 8:36 PM

## 2015-09-18 NOTE — ED Provider Notes (Signed)
CSN: [id number]     Arrival date & time 09/18/15  2059 History   First MD Initiated Contact with Patient 09/18/15 2153     Chief Complaint  Patient presents with  . Medical Clearance     (Consider location/radiation/quality/duration/timing/severity/associated sxs/prior Treatment) HPI Comments: Patient is a 30 year old male who has a history of bipolar disease and was recently admitted to behavioral health and discharged yesterday presenting today with a complaint of not feeling well mentally. He is having some hallucinations but he will not further describe. He denies any suicidal or homicidal ideation but he does complain of feeling anxious on exam. He is complaining of inability to sleep and feeling very stressed. He has complained of a poor appetite and has not really eaten today. He feels that he left behavioral health too soon. He was started on new medications while he is there and he says he has taken them. He denies any alcohol or drug use since discharge.   Past Medical History  Diagnosis Date  . Bipolar 1 disorder (HCC)    History reviewed. No pertinent past surgical history. Family History  Problem Relation Age of Onset  . Mental illness Neg Hx    Social History  Substance Use Topics  . Smoking status: Current Every Day Smoker -- 1.00 packs/day    Types: Cigarettes  . Smokeless tobacco: None  . Alcohol Use: No    Review of Systems  All other systems reviewed and are negative.     Allergies  Review of patient's allergies indicates no known allergies.  Home Medications   Prior to Admission medications   Medication Sig Start Date End Date Taking? Authorizing Provider  benztropine (COGENTIN) 0.5 MG tablet Take 1 tablet (0.5 mg total) by mouth 2 (two) times daily in the am and at bedtime.. 09/17/15   Adonis BrookSheila Agustin, NP  buPROPion (WELLBUTRIN XL) 150 MG 24 hr tablet Take 1 tablet (150 mg total) by mouth daily. 09/17/15   Adonis BrookSheila Agustin, NP  guaiFENesin (ROBITUSSIN) 100  MG/5ML liquid Take 200 mg by mouth 3 (three) times daily as needed for cough.    Historical Provider, MD  haloperidol (HALDOL) 5 MG tablet Take 1 tablet (5 mg total) by mouth 2 (two) times daily in the am and at bedtime.. 09/17/15   Adonis BrookSheila Agustin, NP  hydrOXYzine (ATARAX/VISTARIL) 25 MG tablet Take 1 tablet (25 mg total) by mouth 3 (three) times daily as needed for anxiety. 09/17/15   Adonis BrookSheila Agustin, NP  traZODone (DESYREL) 50 MG tablet Take 1 tablet (50 mg total) by mouth at bedtime as needed for sleep. 09/17/15   Adonis BrookSheila Agustin, NP   BP 139/79 mmHg  Pulse 96  Temp(Src) 98.1 F (36.7 C) (Oral)  Resp 18  SpO2 98% Physical Exam  Constitutional: He is oriented to person, place, and time. He appears well-developed and well-nourished. No distress.  HENT:  Head: Normocephalic and atraumatic.  Mouth/Throat: Oropharynx is clear and moist.  Eyes: Conjunctivae and EOM are normal. Pupils are equal, round, and reactive to light.  Neck: Normal range of motion. Neck supple.  Cardiovascular: Normal rate, regular rhythm and intact distal pulses.   No murmur heard. Pulmonary/Chest: Effort normal and breath sounds normal. No respiratory distress. He has no wheezes. He has no rales.  Abdominal: Soft. He exhibits no distension. There is no tenderness. There is no rebound and no guarding.  Musculoskeletal: Normal range of motion. He exhibits no edema or tenderness.  Neurological: He is alert and oriented to person,  place, and time.  Skin: Skin is warm and dry. No rash noted. No erythema.  Psychiatric: His mood appears anxious. He expresses no homicidal and no suicidal ideation.  Tearful  Nursing note and vitals reviewed.   ED Course  Procedures (including critical care time) Labs Review Labs Reviewed  COMPREHENSIVE METABOLIC PANEL - Abnormal; Notable for the following:    Sodium 132 (*)    Chloride 96 (*)    Glucose, Bld 109 (*)    AST 62 (*)    All other components within normal limits  ETHANOL   CBC  URINE RAPID DRUG SCREEN, HOSP PERFORMED    Imaging Review No results found. I have personally reviewed and evaluated these images and lab results as part of my medical decision-making.   EKG Interpretation None      MDM   Final diagnoses:  Auditory hallucination  Patient is a 30 year old male with a history of bipolar disease presenting for not feeling mentally well. He was discharged from behavioral health yesterday and he feels that he went home too soon. He states he's been extremely stressed at home. He is having hallucinations but we'll not further explained in detail. He denies any suicidal or homicidal ideation. Patient is tearful on exam and states that he just can't take it. He was started on new medications which he admits to taking at home however he has not slept well. He denies any substance use. Patient is medically clear will have TTS evaluate.  Gwyneth Sprout, MD 09/18/15 [phone]

## 2015-09-18 NOTE — ED Notes (Signed)
Bed: W[facility]34 Expected date:  Expected time:  Means of arrival:  Comments: Tr3 

## 2015-09-19 DIAGNOSIS — F312 Bipolar disorder, current episode manic severe with psychotic features: Secondary | ICD-10-CM | POA: Diagnosis present

## 2015-09-19 DIAGNOSIS — R440 Auditory hallucinations: Secondary | ICD-10-CM | POA: Insufficient documentation

## 2015-09-19 LAB — RAPID URINE DRUG SCREEN, HOSP PERFORMED
Amphetamines: NOT DETECTED
BENZODIAZEPINES: NOT DETECTED
Barbiturates: NOT DETECTED
COCAINE: NOT DETECTED
Opiates: NOT DETECTED
Tetrahydrocannabinol: NOT DETECTED

## 2015-09-19 MED ORDER — OLANZAPINE 5 MG PO TABS
5.0000 mg | ORAL_TABLET | Freq: Two times a day (BID) | ORAL | Status: DC
  Administered 2015-09-19 – 2015-09-20 (×3): 5 mg via ORAL
  Filled 2015-09-19 (×3): qty 1

## 2015-09-19 MED ORDER — HYDROXYZINE HCL 25 MG PO TABS
50.0000 mg | ORAL_TABLET | Freq: Three times a day (TID) | ORAL | Status: DC
  Administered 2015-09-19 – 2015-09-20 (×3): 50 mg via ORAL
  Filled 2015-09-19 (×3): qty 2

## 2015-09-19 NOTE — ED Notes (Signed)
Pt. Noted in room. No complaints or concerns voiced. No distress or abnormal behavior noted. Will continue to monitor with security cameras. Q 15 minute rounds continue. 

## 2015-09-19 NOTE — Progress Notes (Signed)
Disposition CSW completed patient referrals to the following inpatient psych facilities:  [facility]  [facility]  CSW will continue to follow patient for placement needs.  Seward SpeckLeo Undray [facility] HospitalCSW,LCAS Behavioral Health Disposition CSW [phone]

## 2015-09-19 NOTE — ED Notes (Signed)
Report received from Edie Marvin RN. Pt. Sleeping, respirations regular and unlabored. Will continue to monitor for safety via security cameras and Q 15 minute checks. 

## 2015-09-19 NOTE — ED Notes (Signed)
Pt. Noted sleeping in room. No complaints or concerns voiced. No distress or abnormal behavior noted. Will continue to monitor with security cameras. Q 15 minute rounds continue. 

## 2015-09-19 NOTE — ED Notes (Signed)
Pt is pleasant and cooperative.  Continues to c/o audio hallucinations.  Denies S/I and H/I.  He is quiet and withdrawn for the most part.  15 minute checks and video monitoring continue.

## 2015-09-19 NOTE — Consult Note (Signed)
[place] [facility] Consult   Reason for Consult:  Hallucinations Referring Physician:  EDP Patient Identification: Todd Shaw MRN:  [id number] Principal Diagnosis: Bipolar affective disorder, current episode manic with psychotic symptoms (Start) Diagnosis:   Patient Active Problem List   Diagnosis Date Noted  . Bipolar affective disorder, current episode manic with psychotic symptoms (Todd Shaw) [F31.2] 09/19/2015    Priority: High  . Attention deficit hyperactivity disorder (ADHD) [F90.9] 09/13/2015  . Cannabis use disorder, moderate, in sustained remission [F12.90] 09/13/2015  . Tobacco use disorder [F17.200] 09/13/2015    Total Time spent with patient: 45 minutes  Subjective:   Todd Shaw is a 30 y.o. male patient admitted with increase in hallucinations.  HPI:  30 yo who was discharged from [facility] on Friday and went to live in a hotel where his voices increased, uncertain if he took his medication or not.  Thought blocking, anxious.  He denies alcohol/drug use but drank two Monster energy drinks.  "I feel terrible."  He appears very anxious and scared.  Complains of hearing voices laughing at him.    Past Psychiatric History: Bipolar affective disorder  Risk to Self: Is patient at risk for suicide?: No, but patient needs Medical Clearance Risk to Others:  NOne Prior Inpatient Therapy:  [facility] Prior Outpatient Therapy:  Unsure of place  Past Medical History:  Past Medical History  Diagnosis Date  . Bipolar 1 disorder (Todd Shaw)    History reviewed. No pertinent past surgical history. Family History:  Family History  Problem Relation Age of Onset  . Mental illness Neg Hx    Family Psychiatric  History: NOne Social History:  History  Alcohol Use No     History  Drug Use No    Social History   Social History  . Marital Status: Single    Spouse Name: N/A  . Number of Children: N/A  . Years of Education: N/A   Social History Main Topics  . Smoking status: Current Every  Day Smoker -- 1.00 packs/day    Types: Cigarettes  . Smokeless tobacco: None  . Alcohol Use: No  . Drug Use: No  . Sexual Activity: Not Currently   Other Topics Concern  . None   Social History Narrative   Additional Social History:    Allergies:  No Known Allergies  Labs:  Results for orders placed or performed during the hospital encounter of 09/18/15 (from the past 48 hour(s))  Comprehensive metabolic panel     Status: Abnormal   Collection Time: 09/18/15 10:32 PM  Result Value Ref Range   Sodium 132 (L) 135 - 145 mmol/L   Potassium 3.5 3.5 - 5.1 mmol/L   Chloride 96 (L) 101 - 111 mmol/L   CO2 24 22 - 32 mmol/L   Glucose, Bld 109 (H) 65 - 99 mg/dL   BUN 9 6 - 20 mg/dL   Creatinine, Ser 0.85 0.61 - 1.24 mg/dL   Calcium 9.3 8.9 - 10.3 mg/dL   Total Protein 7.9 6.5 - 8.1 g/dL   Albumin 4.9 3.5 - 5.0 g/dL   AST 62 (H) 15 - 41 U/L   ALT 29 17 - 63 U/L   Alkaline Phosphatase 54 38 - 126 U/L   Total Bilirubin 0.9 0.3 - 1.2 mg/dL   GFR calc non Af Amer >60 >60 mL/min   GFR calc Af Amer >60 >60 mL/min    Comment: (NOTE) The eGFR has been calculated using the CKD EPI equation. This calculation has not been validated  in all clinical situations. eGFR's persistently <60 mL/min signify possible Chronic Kidney Disease.    Anion gap 12 5 - 15  Ethanol (ETOH)     Status: None   Collection Time: 09/18/15 10:32 PM  Result Value Ref Range   Alcohol, Ethyl (B) <5 <5 mg/dL    Comment:        LOWEST DETECTABLE LIMIT FOR SERUM ALCOHOL IS 5 mg/dL FOR MEDICAL PURPOSES ONLY   CBC     Status: None   Collection Time: 09/18/15 10:32 PM  Result Value Ref Range   WBC 10.5 4.0 - 10.5 K/uL   RBC 4.91 4.22 - 5.81 MIL/uL   Hemoglobin 15.1 13.0 - 17.0 g/dL   HCT 42.6 39.0 - 52.0 %   MCV 86.8 78.0 - 100.0 fL   MCH 30.8 26.0 - 34.0 pg   MCHC 35.4 30.0 - 36.0 g/dL   RDW 13.7 11.5 - 15.5 %   Platelets 279 150 - 400 K/uL  Urine rapid drug screen (hosp performed) (Not at [facility])     Status:  None   Collection Time: 09/19/15  8:00 AM  Result Value Ref Range   Opiates NONE DETECTED NONE DETECTED   Cocaine NONE DETECTED NONE DETECTED   Benzodiazepines NONE DETECTED NONE DETECTED   Amphetamines NONE DETECTED NONE DETECTED   Tetrahydrocannabinol NONE DETECTED NONE DETECTED   Barbiturates NONE DETECTED NONE DETECTED    Comment:        DRUG SCREEN FOR MEDICAL PURPOSES ONLY.  IF CONFIRMATION IS NEEDED FOR ANY PURPOSE, NOTIFY LAB WITHIN 5 DAYS.        LOWEST DETECTABLE LIMITS FOR URINE DRUG SCREEN Drug Class       Cutoff (ng/mL) Amphetamine      1000 Barbiturate      200 Benzodiazepine   614 Tricyclics       431 Opiates          300 Cocaine          300 THC              50     Current Facility-Administered Medications  Medication Dose Route Frequency Provider Last Rate Last Dose  . guaiFENesin (ROBITUSSIN) 100 MG/5ML solution 200 mg  200 mg Oral TID PRN Blanchie Dessert, MD      . hydrOXYzine (ATARAX/VISTARIL) tablet 50 mg  50 mg Oral TID Patrecia Pour, NP      . OLANZapine (ZYPREXA) tablet 5 mg  5 mg Oral BID Patrecia Pour, NP   5 mg at 09/19/15 1227  . traZODone (DESYREL) tablet 50 mg  50 mg Oral QHS PRN Blanchie Dessert, MD   50 mg at 09/18/15 2245   Current Outpatient Prescriptions  Medication Sig Dispense Refill  . benztropine (COGENTIN) 0.5 MG tablet Take 1 tablet (0.5 mg total) by mouth 2 (two) times daily in the am and at bedtime.. 60 tablet 0  . buPROPion (WELLBUTRIN XL) 150 MG 24 hr tablet Take 1 tablet (150 mg total) by mouth daily. 30 tablet 0  . guaiFENesin (ROBITUSSIN) 100 MG/5ML liquid Take 200 mg by mouth 3 (three) times daily as needed for cough.    . haloperidol (HALDOL) 5 MG tablet Take 1 tablet (5 mg total) by mouth 2 (two) times daily in the am and at bedtime.. 60 tablet 0  . hydrOXYzine (ATARAX/VISTARIL) 25 MG tablet Take 1 tablet (25 mg total) by mouth 3 (three) times daily as needed for anxiety. 90 tablet 0  .  traZODone (DESYREL) 50 MG tablet  Take 1 tablet (50 mg total) by mouth at bedtime as needed for sleep. 30 tablet 0    Musculoskeletal: Strength & Muscle Tone: within normal limits Gait & Station: normal Patient leans: N/A  Psychiatric Specialty Exam: Review of Systems  Constitutional: Negative.   HENT: Negative.   Eyes: Negative.   Respiratory: Negative.   Cardiovascular: Negative.   Gastrointestinal: Negative.   Genitourinary: Negative.   Musculoskeletal: Negative.   Skin: Negative.   Neurological: Negative.   Endo/Heme/Allergies: Negative.   Psychiatric/Behavioral: Positive for depression. The patient is nervous/anxious.     Blood pressure 140/76, pulse 104, temperature 98.2 F (36.8 C), temperature source Oral, resp. rate 18, SpO2 100 %.There is no weight on file to calculate BMI.  General Appearance: Disheveled  Eye Sport and exercise psychologist::  Fair  Speech:  Blocked  Volume:  Decreased  Mood:  Anxious and Depressed  Affect:  Blunt  Thought Process:  Coherent  Orientation:  Full (Time, Place, and Person)  Thought Content:  Hallucinations: Auditory and Paranoid Ideation  Suicidal Thoughts:  No  Homicidal Thoughts:  No  Memory:  Immediate;   Fair Recent;   Fair Remote;   Fair  Judgement:  Impaired  Insight:  Fair  Psychomotor Activity:  Decreased  Concentration:  Fair  Recall:  [employer] of Knowledge:Fair  Language: Fair  Akathisia:  No  Handed:  Right  AIMS (if indicated):     Assets:  Leisure Time Physical Health Resilience Social Support  ADL's:  Intact  Cognition: Impaired,  Mild  Sleep:      Treatment Plan Summary: Daily contact with patient to assess and evaluate symptoms and progress in treatment, Medication management and Plan bipolar affective disorder, most recent episode manic, with psychotic features: -Crisis stabilization -Medication management:  Discontinue Wellbutrin due to anxiety and increased Vistaril 25 mg PRN anxiety to 50 mg TID scheduled.  Started Zyprexa 5 mg BID for mood  stabilization and discontinued Haldol 5 mg BID for psychosis, discontinue Cogentin due to decrease risk of EPS with Zyprexa.  Started Trazodone 50 mg daily for sleep -Individual counseling  Disposition: Recommend psychiatric Inpatient admission when medically cleared.  Waylan Boga, NP 09/19/2015 1:10 PM Patient seen face-to-face for psychiatric evaluation, chart reviewed and case discussed with the physician extender and developed treatment plan. Reviewed the information documented and agree with the treatment plan. Corena Pilgrim, MD

## 2015-09-19 NOTE — ED Notes (Signed)
Snack and beverage given. 

## 2015-09-20 ENCOUNTER — Encounter (HOSPITAL_COMMUNITY): Payer: Self-pay

## 2015-09-20 ENCOUNTER — Inpatient Hospital Stay (HOSPITAL_COMMUNITY)
Admission: AD | Admit: 2015-09-20 | Discharge: 2015-09-24 | DRG: 885 | Disposition: A | Discharge status: Home / Self Care | Payer: Federal, State, Local not specified - Other | Source: Intra-hospital | Attending: Psychiatry | Admitting: Psychiatry

## 2015-09-20 DIAGNOSIS — F333 Major depressive disorder, recurrent, severe with psychotic symptoms: Principal | ICD-10-CM | POA: Diagnosis present

## 2015-09-20 DIAGNOSIS — F315 Bipolar disorder, current episode depressed, severe, with psychotic features: Secondary | ICD-10-CM | POA: Diagnosis present

## 2015-09-20 DIAGNOSIS — F1221 Cannabis dependence, in remission: Secondary | ICD-10-CM | POA: Diagnosis present

## 2015-09-20 DIAGNOSIS — F1290 Cannabis use, unspecified, uncomplicated: Secondary | ICD-10-CM | POA: Diagnosis not present

## 2015-09-20 DIAGNOSIS — F17200 Nicotine dependence, unspecified, uncomplicated: Secondary | ICD-10-CM | POA: Diagnosis not present

## 2015-09-20 DIAGNOSIS — F17210 Nicotine dependence, cigarettes, uncomplicated: Secondary | ICD-10-CM | POA: Diagnosis present

## 2015-09-20 MED ORDER — ACETAMINOPHEN 325 MG PO TABS: 650.0000 mg | ORAL_TABLET | Freq: Four times a day (QID) | ORAL | Status: DC | PRN

## 2015-09-20 MED ORDER — OLANZAPINE 5 MG PO TABS
5.0000 mg | ORAL_TABLET | Freq: Two times a day (BID) | ORAL | Status: DC
  Administered 2015-09-20 – 2015-09-21 (×2): 5 mg via ORAL
  Filled 2015-09-20 (×4): qty 1
  Filled 2015-09-20: qty 2

## 2015-09-20 MED ORDER — HYDROXYZINE HCL 50 MG PO TABS
50.0000 mg | ORAL_TABLET | Freq: Three times a day (TID) | ORAL | Status: DC
  Administered 2015-09-20 – 2015-09-24 (×10): 50 mg via ORAL
  Filled 2015-09-20 (×2): qty 1
  Filled 2015-09-20: qty 21
  Filled 2015-09-20 (×2): qty 1
  Filled 2015-09-20: qty 21
  Filled 2015-09-20 (×4): qty 1
  Filled 2015-09-20: qty 21
  Filled 2015-09-20 (×2): qty 1
  Filled 2015-09-20: qty 21
  Filled 2015-09-20 (×6): qty 1

## 2015-09-20 MED ORDER — ALUM & MAG HYDROXIDE-SIMETH 200-200-20 MG/5ML PO SUSP: 30.0000 mL | ORAL | Status: DC | PRN

## 2015-09-20 MED ORDER — TRAZODONE HCL 50 MG PO TABS
50.0000 mg | ORAL_TABLET | Freq: Every evening | ORAL | Status: DC | PRN
  Administered 2015-09-20 – 2015-09-23 (×4): 50 mg via ORAL
  Filled 2015-09-20 (×2): qty 1
  Filled 2015-09-20: qty 14
  Filled 2015-09-20: qty 1
  Filled 2015-09-20: qty 14
  Filled 2015-09-20 (×7): qty 1

## 2015-09-20 MED ORDER — GUAIFENESIN 100 MG/5ML PO SOLN: 200.0000 mg | Freq: Three times a day (TID) | ORAL | Status: DC | PRN

## 2015-09-20 MED ORDER — TRAZODONE HCL 50 MG PO TABS
50.0000 mg | ORAL_TABLET | Freq: Every evening | ORAL | Status: DC | PRN
  Filled 2015-09-20: qty 1

## 2015-09-20 MED ORDER — HYDROXYZINE HCL 50 MG PO TABS
50.0000 mg | ORAL_TABLET | Freq: Three times a day (TID) | ORAL | Status: DC
  Filled 2015-09-20: qty 1

## 2015-09-20 MED ORDER — MAGNESIUM HYDROXIDE 400 MG/5ML PO SUSP: 30.0000 mL | Freq: Every day | ORAL | Status: DC | PRN

## 2015-09-20 NOTE — ED Notes (Signed)
Pt. Noted sleeping in room. No complaints or concerns voiced. No distress or abnormal behavior noted. Will continue to monitor with security cameras. Q 15 minute rounds continue. 

## 2015-09-20 NOTE — Plan of Care (Signed)
Problem: Ineffective individual coping Goal: STG: Patient will remain free from self harm Outcome: Progressing Pt safe on the unit at this time     

## 2015-09-20 NOTE — BH Assessment (Signed)
[facility] Assessment Progress Note  Per Thedore MinsMojeed Akintayo, MD, this pt requires psychiatric hospitalization at this time.  Berneice Heinrichina Tate, RN, [id number].  Pt has signed Voluntary Admission and Consent for Treatment, as well as Consent to Release Information to [facility], his outpatient provider, and to his mother, and a notification call has been placed to the former.  Signed forms have [facility].  Pt's nurse, Kendal Hymendie, has been notified, and agrees to send original paperwork along with pt via [facility], and to call report to [phone].  Doylene Canninghomas Keylen Eckenrode, MA Triage Specialist [phone]

## 2015-09-20 NOTE — Tx Team (Signed)
Initial Interdisciplinary Treatment Plan   PATIENT STRESSORS: Marital or family conflict Medication change or noncompliance   PATIENT STRENGTHS: Supportive family/friends Work skills   PROBLEM LIST: Problem List/Patient Goals Date to be addressed Date deferred Reason deferred Estimated date of resolution  Anxiety 09/20/15     Depression 09/20/15     Agitation 09/20/15     A/V hallucination/paranoia 09/20/15     "get well" 09/20/15     "get back to doing what I love" 09/20/15                        DISCHARGE CRITERIA:  Motivation to continue treatment in a less acute level of care Need for constant or close observation no longer present  PRELIMINARY DISCHARGE PLAN: Outpatient therapy Medication managment  PATIENT/FAMIILY INVOLVEMENT: This treatment plan has been presented to and reviewed with the patient, Iven Finnerry Zadrozny.  The patient and family have been given the opportunity to ask questions and make suggestions.  Norm ParcelHeather V Donyea Beverlin 09/20/2015, 6:00 PM

## 2015-09-20 NOTE — ED Notes (Signed)
Pt discharged ambulatory with [place] driver.  All belongings were returned to patient. 

## 2015-09-20 NOTE — Progress Notes (Signed)
D: Pt denies SI/HI/AVH. Pt is pleasant and cooperative. Pt stated he was doing ok, tonight, pt getting acclimated to the unit.   A: Pt was offered support and encouragement. Pt was given scheduled medications. Pt was encourage to attend groups. Q 15 minute checks were done for safety.   R:Pt attends groups and interacts well with peers and staff. Pt is taking medication. Pt has no complaints.Pt receptive to treatment and safety maintained on unit.

## 2015-09-20 NOTE — BH Assessment (Signed)
Patient reassessed by TTS.   Patient is laying in bed under a blanket and is alert and oriented x4. Patient states that he came into the ED Z"because i wasn't feeling too well." Patient states that he feels like people "are after me." Patient states that he is also having auditory hallucinations but declines to answer more questions about hallucinations stating "I feel better now with the medicine I have here." Patient denies SI and HI at this time.    Consulted with psychiatry and Dr. Jannifer FranklinAkintayo who recommends inpatient treatment at this time.   Davina PokeJoVea Karandeep Resende, LCSW Therapeutic Triage Specialist [facility] 09/20/2015 10:03 AM

## 2015-09-20 NOTE — Progress Notes (Signed)
This is a 30 yr old male [facility] . [facility] and was doing well. Pt stated " I feel like I wash rushed from here." Pt said while at home he started feeling nervous and fearful and felt like some people where out to get him. Pt has been calm and cooperative with the admission process, alert and oriented x 4. Consents signed, skin/belongings search completed and pt oriented to unit. Pt stable at this time. Pt given the opportunity to express concerns and ask questions. Pt given toiletries. Will continue to monitor.

## 2015-09-20 NOTE — Progress Notes (Signed)
Pt did not attend wrap up group this evening.  

## 2015-09-21 ENCOUNTER — Encounter (HOSPITAL_COMMUNITY): Payer: Self-pay | Admitting: Psychiatry

## 2015-09-21 DIAGNOSIS — F1290 Cannabis use, unspecified, uncomplicated: Secondary | ICD-10-CM

## 2015-09-21 DIAGNOSIS — F17200 Nicotine dependence, unspecified, uncomplicated: Secondary | ICD-10-CM

## 2015-09-21 DIAGNOSIS — F333 Major depressive disorder, recurrent, severe with psychotic symptoms: Secondary | ICD-10-CM | POA: Diagnosis present

## 2015-09-21 MED ORDER — BENZTROPINE MESYLATE 0.5 MG PO TABS
0.5000 mg | ORAL_TABLET | Freq: Every day | ORAL | Status: DC
  Administered 2015-09-21 – 2015-09-23 (×3): 0.5 mg via ORAL
  Filled 2015-09-21 (×5): qty 1

## 2015-09-21 MED ORDER — HALOPERIDOL 5 MG PO TABS
10.0000 mg | ORAL_TABLET | Freq: Every day | ORAL | Status: DC
  Administered 2015-09-21 – 2015-09-23 (×3): 10 mg via ORAL
  Filled 2015-09-21 (×5): qty 2

## 2015-09-21 MED ORDER — BUPROPION HCL ER (XL) 150 MG PO TB24
150.0000 mg | ORAL_TABLET | Freq: Every day | ORAL | Status: DC
Start: 2015-09-21 — End: 2015-09-23
  Administered 2015-09-21 – 2015-09-23 (×3): 150 mg via ORAL
  Filled 2015-09-21 (×5): qty 1

## 2015-09-21 MED ORDER — HALOPERIDOL 5 MG PO TABS
5.0000 mg | ORAL_TABLET | Freq: Every day | ORAL | Status: DC
  Administered 2015-09-22 – 2015-09-24 (×3): 5 mg via ORAL
  Filled 2015-09-21 (×4): qty 1
  Filled 2015-09-21: qty 21

## 2015-09-21 MED ORDER — BENZTROPINE MESYLATE 0.5 MG PO TABS
0.5000 mg | ORAL_TABLET | Freq: Every day | ORAL | Status: DC
  Administered 2015-09-22 – 2015-09-24 (×3): 0.5 mg via ORAL
  Filled 2015-09-21: qty 14
  Filled 2015-09-21 (×4): qty 1

## 2015-09-21 NOTE — [facility] Group Notes (Signed)
[facility] LCSW Group Therapy  09/21/2015 , 3:12 PM   Type of Therapy:  Group Therapy  Participation Level:  Active  Participation Quality:  Attentive  Affect:  Appropriate  Cognitive:  Alert  Insight:  Improving  Engagement in Therapy:  Engaged  Modes of Intervention:  Discussion, Exploration and Socialization  Summary of Progress/Problems: Today's group focused on the term Diagnosis.  Participants were asked to define the term, and then pronounce whether it is a negative, positive or neutral term.  Todd Shaw was in and out of group several times; difficulty settling.  Poured coffee, promptly spilled the whole thing, spent time cleaning it up.  Talked briefly about how he missed church last week because he was back in the ED, and his hope that he will be out of the hospital by this Sunday so he can play piano again.  [name], [name] 09/21/2015 , 3:12 PM

## 2015-09-21 NOTE — H&P (Signed)
Psychiatric Admission Assessment Adult  Patient Identification: Todd Shaw MRN:  [id number] Date of Evaluation:  09/21/2015 Chief Complaint: Patient states " I am not even sure.'    Principal Diagnosis: Major depressive disorder, recurrent episode, severe, with psychotic behavior (HCC) Diagnosis:   Patient Active Problem List   Diagnosis Date Noted  . Major depressive disorder, recurrent episode, severe, with psychotic behavior (HCC) [F33.3] 09/21/2015  . Auditory hallucination [R44.0]   . Attention deficit hyperactivity disorder (ADHD) [F90.9] 09/13/2015  . Cannabis use disorder, moderate, in sustained remission [F12.90] 09/13/2015  . Tobacco use disorder [F17.200] 09/13/2015      History of Present Illness::Todd Shaw is a 30 y.o. AA male, single , employed at a [employer] , was staying at a hotel,was recently discharged from [facility] on 09/17/15 after he was stabilized on medications for depression and psychosis, presented to [facility] with severe paranoia , brought in by his sister.   Patient seen and chart reviewed today .Discussed patient with treatment team.  Pt today seen as depressed , paranoid. Pt reports that he left the hospital on Friday and was staying at a hotel. Pt reports that he stopped all his medications the same day. Pt reports that he started getting very paranoid and anxious . He reports that he does not know how to explain his thoughts and the way he felt. Pt reports that he called his sister on the phone and she brought him back to the ED to get help. Pt reports that he currently feels drowsy from the AM medications , but otherwise he feels OK. Pt reports he does not feel extremely paranoid anymore. Pt denies any SI/HI at this time, but he does have mild paranoia , which can make him a danger to self or others. Pt is agreeable to be restarted on medications that he were discharged on last admission , which are the wellbutrin and haldol.    Associated  Signs/Symptoms: Depression Symptoms:  depressed mood, anhedonia, psychomotor retardation, feelings of worthlessness/guilt, difficulty concentrating, disturbed sleep, (Hypo) Manic Symptoms:  Hallucinations, Impulsivity, Anxiety Symptoms:  denies Psychotic Symptoms:  Hallucinations: Auditory Visual, paranoia PTSD Symptoms: Negative Total Time spent with patient: 45 minutes  Past Psychiatric History: Pt was recently discharged from [facility] on 09/17/15- MDD with psychosis , was admitted also on  09/11/2008 - diagnosis of substance induced psychosis ( cough pill , cannabis ) . Pt follows up with [name]. Pt denies past hx of suicide attempts.  Is the patient at risk to self? Yes.    Has the patient been a risk to self in the past 6 months? Yes.    Has the patient been a risk to self within the distant past? Yes.    Is the patient a risk to others? Yes.   yes he is psychotic  Has the patient been a risk to others in the past 6 months? Yes.    Has the patient been a risk to others within the distant past? Yes.     Prior Inpatient Therapy:   Prior Outpatient Therapy:    Alcohol Screening: Patient refused Alcohol Screening Tool: Yes 1. How often do you have a drink containing alcohol?: Never 2. How many drinks containing alcohol do you have on a typical day when you are drinking?: 1 or 2 3. How often do you have six or more drinks on one occasion?: Never Preliminary Score: 0 4. How often during the last year have you found that you were not able to stop drinking  once you had started?: Never 5. How often during the last year have you failed to do what was normally expected from you becasue of drinking?: Never 6. How often during the last year have you needed a first drink in the morning to get yourself going after a heavy drinking session?: Never 7. How often during the last year have you had a feeling of guilt of remorse after drinking?: Never 8. How often during the last year have you been  unable to remember what happened the night before because you had been drinking?: Never 9. Have you or someone else been injured as a result of your drinking?: No 10. Has a relative or friend or a doctor or another health worker been concerned about your drinking or suggested you cut down?: No Alcohol Use Disorder Identification Test Final Score (AUDIT): 0 Brief Intervention: AUDIT score less than 7 or less-screening does not suggest unhealthy drinking-brief intervention not indicated Substance Abuse History in the last 12 months:  Yes.  cannabis in the past - reports he does not abuse it anymore , unknown when was last use Consequences of Substance Abuse: Negative Previous Psychotropic Medications: Yes  Psychological Evaluations: No  Past Medical History:  Past Medical History  Diagnosis Date  . Bipolar 1 disorder (HCC)    History reviewed. No pertinent past surgical history. Family History: Pt denies hx of HTN, seizures, cardiac disease, thyroid do in family. Family History  Problem Relation Age of Onset  . Mental illness Neg Hx    Family Psychiatric  History: Pt denies hx of mental illness, substance abuse in family Tobacco Screening: smokes cigarettes , offered patch Social History: Pt is single , employed at a [employer] , [place] , denies hx of legal issues. History  Alcohol Use No     History  Drug Use No    Additional Social History:      Pain Medications: None Reported Prescriptions: Pt reports current compliance with medications Over the Counter: None Reported History of alcohol / drug use?: No history of alcohol / drug abuse Withdrawal Symptoms: Irritability, Agitation                    Allergies:  No Known Allergies Lab Results: No results found for this or any previous visit (from the past 48 hour(s)).  Blood Alcohol level:  Lab Results  Component Value Date   [facility] 09/18/2015   ETH <5 09/13/2015    Metabolic Disorder Labs:   Lab Results  Component Value Date   HGBA1C 5.5 09/13/2015   MPG 111 09/13/2015   Lab Results  Component Value Date   PROLACTIN 30.8* 09/13/2015   Lab Results  Component Value Date   CHOL 141 09/14/2015   TRIG 86 09/14/2015   HDL 40* 09/14/2015   CHOLHDL 3.5 09/14/2015   VLDL 17 09/14/2015   LDLCALC 84 09/14/2015    Current Medications: Current Facility-Administered Medications  Medication Dose Route Frequency Provider Last Rate Last Dose  . acetaminophen (TYLENOL) tablet 650 mg  650 mg Oral Q6H PRN Charm RingsJamison Y Lord, NP      . alum & mag hydroxide-simeth (MAALOX/MYLANTA) 200-200-20 MG/5ML suspension 30 mL  30 mL Oral Q4H PRN Charm RingsJamison Y Lord, NP      . Melene Muller[START ON 09/22/2015] benztropine (COGENTIN) tablet 0.5 mg  0.5 mg Oral QPC breakfast Vickey Ewbank, MD      . benztropine (COGENTIN) tablet 0.5 mg  0.5 mg Oral  QHS Jomarie Longs, MD      . buPROPion (WELLBUTRIN XL) 24 hr tablet 150 mg  150 mg Oral Daily Marqui Formby, MD      . guaiFENesin (ROBITUSSIN) 100 MG/5ML solution 200 mg  200 mg Oral TID PRN Charm Rings, NP      . haloperidol (HALDOL) tablet 10 mg  10 mg Oral QHS Jomarie Longs, MD      . Melene Muller ON 09/22/2015] haloperidol (HALDOL) tablet 5 mg  5 mg Oral QPC breakfast Aimee Heldman, MD      . hydrOXYzine (ATARAX/VISTARIL) tablet 50 mg  50 mg Oral TID Kerry Hough, PA-C   50 mg at 09/21/15 0853  . magnesium hydroxide (MILK OF MAGNESIA) suspension 30 mL  30 mL Oral Daily PRN Charm Rings, NP      . traZODone (DESYREL) tablet 50 mg  50 mg Oral QHS,MR X 1 Kerry Hough, PA-C   50 mg at 09/20/15 2114   PTA Medications: Prescriptions prior to admission  Medication Sig Dispense Refill Last Dose  . benztropine (COGENTIN) 0.5 MG tablet Take 1 tablet (0.5 mg total) by mouth 2 (two) times daily in the am and at bedtime.. 60 tablet 0 09/18/2015 at Unknown time  . guaiFENesin (ROBITUSSIN) 100 MG/5ML liquid Take 200 mg by mouth 3 (three) times daily as needed for cough.   Past  Month at Unknown time  . haloperidol (HALDOL) 5 MG tablet Take 1 tablet (5 mg total) by mouth 2 (two) times daily in the am and at bedtime.. 60 tablet 0 09/18/2015 at Unknown time  . hydrOXYzine (ATARAX/VISTARIL) 25 MG tablet Take 1 tablet (25 mg total) by mouth 3 (three) times daily as needed for anxiety. 90 tablet 0 unknown  . traZODone (DESYREL) 50 MG tablet Take 1 tablet (50 mg total) by mouth at bedtime as needed for sleep. 30 tablet 0 09/17/2015 at Unknown time    Musculoskeletal: Strength & Muscle Tone: within normal limits Gait & Station: normal Patient leans: Front  Psychiatric Specialty Exam: Physical Exam  Nursing note and vitals reviewed. Constitutional:  I concur with PE done in ED.  Genitourinary:  Denies any sx in this area    Review of Systems  Psychiatric/Behavioral: Positive for depression and hallucinations. The patient is nervous/anxious.   All other systems reviewed and are negative.   Blood pressure 117/71, pulse 135, temperature 98.4 F (36.9 C), temperature source Oral, resp. rate 18, height 5' 6.75" (1.695 m), weight 81.647 kg (180 lb).Body mass index is 28.42 kg/(m^2).  General Appearance: Disheveled  Eye Solicitor::  None  Speech:  Slow  Volume:  Decreased  Mood:  Depressed  Affect:  Depressed  Thought Process:  Linear  Orientation:  Full (Time, Place, and Person)  Thought Content:  Hallucinations: Auditory ON ADMISSION, also has paranoid delusions  Suicidal Thoughts:  No is a danger to self or others due to paranoia  Homicidal Thoughts:  No  Memory:  Immediate;   Fair Recent;   Fair Remote;   Fair  Judgement:  Impaired  Insight:  Shallow  Psychomotor Activity:  Decreased  Concentration:  Poor  Recall:  [employer] of Knowledge:Fair  Language: Fair  Akathisia:  No  Handed:  Right  AIMS (if indicated):     Assets:  Desire for Improvement  ADL's:  Intact  Cognition: WNL  Sleep:  Number of Hours: 6.75     Treatment Plan Summary:Zimere Pro is  a 30 y.o. AA male, single ,  employed at a [employer] , lives at a hotel in [place], who has a past hx of psychosis, depression , who was recently discharged from [facility] on 09/17/15, stopped all his medications after DC, decompensated and was brought back to ED by sister. Pt will be restarted on his medications , he agrees with same. Will continue treatment.   (Daily contact with patient to assess and evaluate symptoms and progress in treatment and Medication management   Patient will benefit from inpatient treatment and stabilization.  Estimated length of stay is 5-7 days.  Reviewed past medical records,treatment plan.  Will start Haldol 5 mg po daily and 10 mg po qhs for psychosis. Will add Cogentin 0.5 mg po bid for EPS. Will add Wellbutrin XL 150  mg po daily for affective sx. Will add Trazodone 50 mg po qhs  for sleep. Will make available PRN medications as per agitation protocol. Will continue to monitor vitals ,medication compliance and treatment side effects while patient is here.  Will monitor for medical issues as well as call consult as needed.  Will order all routine labs including cmp - shows Na+ as low , will repeat , TSH - wnl, ( 09/13/15) , lipid panel - wnl ( 09/14/15) , PL - slightly elevated at 30.8, Hba1c- wnl ( 09/13/15) ,BAL<5, UDS- negative. CSW will start working on disposition.  Patient to participate in therapeutic milieu .       Observation Level/Precautions:  15 minute checks    Psychotherapy:  Individual and group therapy     Consultations:  Social worker  Discharge Concerns:stability and safety         I certify that inpatient services furnished can reasonably be expected to improve the patient's condition.    Jomarie Longs, MD 3/14/201711:16 AM

## 2015-09-21 NOTE — [facility] Suicide Risk Assessment (Signed)
[facility]   Nursing information obtained from:    Demographic factors:    Current Mental Status:    Loss Factors:    Historical Factors:    Risk Reduction Factors:     Total Time spent with patient: 30 minutes Principal Problem: Major depressive disorder, recurrent episode, severe, with psychotic behavior (HCC) Diagnosis:   Patient Active Problem List   Diagnosis Date Noted  . Major depressive disorder, recurrent episode, severe, with psychotic behavior (HCC) [F33.3] 09/21/2015  . Auditory hallucination [R44.0]   . Attention deficit hyperactivity disorder (ADHD) [F90.9] 09/13/2015  . Cannabis use disorder, moderate, in sustained remission [F12.90] 09/13/2015  . Tobacco use disorder [F17.200] 09/13/2015   Subjective Data: Please see H&P.   Continued Clinical Symptoms:  Alcohol Use Disorder Identification Test Final Score (AUDIT): 0 The "Alcohol Use Disorders Identification Test", Guidelines for Use in Primary Care, Second Edition.  [name]. Score between 0-7:  no or low risk or alcohol related problems. Score between 8-15:  moderate risk of alcohol related problems. Score between 16-19:  high risk of alcohol related problems. Score 20 or above:  warrants further diagnostic evaluation for alcohol dependence and treatment.   CLINICAL FACTORS:   Alcohol/Substance Abuse/Dependencies Currently Psychotic Unstable or Poor Therapeutic Relationship Previous Psychiatric Diagnoses and Treatments   Musculoskeletal: Strength & Muscle Tone: within normal limits Gait & Station: normal Patient leans: N/A  Psychiatric Specialty Exam: Review of Systems  Psychiatric/Behavioral: Positive for depression. The patient is nervous/anxious.   All other systems reviewed and are negative.   Blood pressure 117/71, pulse 135, temperature 98.4 F (36.9 C), temperature source Oral, resp. rate 18, height 5' 6.75" (1.695 m), weight 81.647 kg (180  lb).Body mass index is 28.42 kg/(m^2).                      Please see H&P.                                   COGNITIVE FEATURES THAT CONTRIBUTE TO RISK:  Closed-mindedness, Polarized thinking and Thought constriction (tunnel vision)    SUICIDE RISK:   Moderate:  Frequent suicidal ideation with limited intensity, and duration, some specificity in terms of plans, no associated intent, good self-control, limited dysphoria/symptomatology, some risk factors present, and identifiable protective factors, including available and accessible social support.  PLAN OF CARE: Please see H&P.   I certify that inpatient services furnished can reasonably be expected to improve the patient's condition.   Todd Tucker, MD 09/21/2015, 10:53 AM

## 2015-09-21 NOTE — BHH Counselor (Addendum)
Adult Psychosocial Assessment Update Interdisciplinary Team  [facility]  Admissions Discharges  Date: 09/13/15 Date:09/17/15  Date:  Date:  Date: Date:  Date: Date:  Date: Date:   Changes since the last Psychosocial Assessment (including adherence to outpatient mental health and/or substance abuse treatment, situational issues contributing to decompensation and/or relapse). Todd Shaw was discharged on Friday and returned to the ED on Sat., 31 hours after discharge.  His sister accompanied him, and described confusion, AH and paranoia as current concerns.  Todd Shaw states that he left the hospital with a friend who was discharging, and stayed at a hotel overnight.  When asked why he chose to stay in a hotel rather than return home with his mother, he replied "I had money, I am an adult, and it was my choice."  When asked what led to his symptoms returning,  He is unable to identify any precipitants.  "I just don't know.  Can you ask me later?"             Discharge Plan 1. Will you be returning to the same living situation after discharge?  Yes:X No: If no, what is your plan?  States he will return with mother this time         2. Would you like a referral for services when you are discharged? Yes:X  If yes, for what services? No:  Medication management         Summary and Recommendations (to be completed by the evaluator) Todd Shaw is a 30 YO AA male with a diagnosis of MDD, severe, recurrent with psychosis. His sister brought him back to the hospital the day after discharge because of re-occuring symptoms. He can benefit from crises stabilization, medication management, therapeutic milieu and referral for services.                       Signature: Ida Rogueorth, Tage Feggins B, 09/21/2015

## 2015-09-21 NOTE — Tx Team (Signed)
Interdisciplinary Treatment Plan Update (Adult)  Date:  09/21/2015  Time Reviewed:  2:09 PM   Progress in Treatment: Attending groups: Yes  Participating in groups:  Yes Taking medication as prescribed:  Yes. Tolerating medication:  Yes. Family/Significant othe contact made: Yes  Patient understands diagnosis:  Yes  AEB asking for help with his symptoms Discussing patient identified problems/goals with staff:  Yes. Medical problems stabilized or resolved:  Yes. Denies suicidal/homicidal ideation: Yes. Issues/concerns per patient self-inventory:  Other:  Discharge Plan or Barriers:  CSW assessing for appropriate referrals. Pt reports that he had gone to [facility] for medication management but has hx of medication noncompliance.   Reason for Continuation of Hospitalization: Depression Hallucinations Medication stabilization  Comments: Patient is laying in bed under a blanket and is alert and oriented x4. Patient states that he came into the ED Z"because i wasn't feeling too well." Patient states that he feels like people "are after me." Patient states that he is also having auditory hallucinations but declines to answer more questions.  Will start Haldol 5 mg po daily and 10 mg po qhs for psychosis. Will add Cogentin 0.5 mg po bid for EPS. Will add Wellbutrin XL 150 mg po daily for affective sx. Will add Trazodone 50 mg po qhs for sleep. Estimated length of stay:  4-5 days  New goal(s): to develop effective aftercare plan.   Additional Comments:  Patient and CSW reviewed pt's identified goals and treatment plan. Patient verbalized understanding and agreed to treatment plan. CSW reviewed [facility] "Discharge Process and Patient Involvement" Form. Pt verbalized understanding of information provided and signed form.    Review of initial/current patient goals per problem list:  1. Goal(s): Patient will participate in aftercare plan  Met: Yes  Target date: at discharge  As  evidenced by: Patient will participate within aftercare plan AEB aftercare provider and housing plan at discharge being identified. 3/7: CSW assessing for appropriate referrals.  09/16/15: States he will stay with his mother, follow up [name]  2. Goal (s): Patient will exhibit decreased depressive symptoms and suicidal ideations.  Met: Yes   Target date: at discharge  As evidenced by: Patient will utilize self rating of depression at 3 or below and demonstrate decreased signs of depression or be deemed stable for discharge by MD.   09/21/15:  Rates his depression a 7 today  3. Goal(s): Patient will demonstrate decreased signs and symptoms of psychosis.   Met:No  Target date: at discharge  As evidenced by: Patient will demonstrateddecreased signs of psychosis, or be deemed stable for discharge by MD.  09/21/15: Pt reports continuing AVH relating to God, paranoia and confusion   Attendees: Patient:   09/21/2015 2:09 PM   Family:   09/21/2015 2:09 PM   Physician:  Ursula Alert  09/21/2015 2:09 PM   Nursing:   Edd Arbour  09/21/2015 2:09 PM   Clinical Social Worker: rod Jazmina Muhlenkamp  09/21/2015 2:09 PM    09/21/2015 2:09 PM   Other:  Gerline Legacy Nurse Case Manager 09/21/2015 2:09 PM   Other:   09/21/2015 2:09 PM   Other:   09/21/2015 2:09 PM   Other:  09/21/2015 2:09 PM   Other:  09/21/2015 2:09 PM   Other:  09/21/2015 2:09 PM    09/21/2015 2:09 PM    09/21/2015 2:09 PM    09/21/2015 2:09 PM    09/21/2015 2:09 PM    Scribe for Treatment Team:   Ripley Fraise  09/21/2015 2:09 PM

## 2015-09-21 NOTE — Progress Notes (Signed)
Nursing Shift NOte:  Patient has slept much of the shift, not coming to the med window when he is supposed to but cooperative when prompted. Patient denies any SI/HI/AVH, depression or anxiety. States his goals for the day were to sleep on his Patient Self Inventory Sheet and Nurse teasing him that he has certainly met his goals for the day, to which patient smiling and chuckling a little bit. Nurse ensuring continuous q15 minute checks for safety and providing emotional support and therapeutic communication. Patient remains safe on the Unit.

## 2015-09-22 DIAGNOSIS — F333 Major depressive disorder, recurrent, severe with psychotic symptoms: Principal | ICD-10-CM

## 2015-09-22 NOTE — Progress Notes (Signed)
D: Pt presents with appropriate affect and pleasant mood.  He reports his day was "pretty good" and that his goal is "to get better."  Pt reports he feels "all right."  Pt denies SI/HI, denies hallucinations, denies pain.  He has been visible in milieu and he attended evening group.  Pt has interacted appropriately with peers and staff tonight.    A: Introduced self to pt.  Actively listened to pt and offered support and encouragement.  Medication administered per order.    R: Pt is compliant with medication.  He verbally contracts for safety and reports that he will inform staff of needs and concerns.  Will continue to monitor and assess.

## 2015-09-22 NOTE — Progress Notes (Signed)
Adult Psychoeducational Group Note  Date:  09/22/2015 Time:  1:04 AM  Group Topic/Focus:  Wrap-Up Group:   The focus of this group is to help patients review their daily goal of treatment and discuss progress on daily workbooks.  Participation Level:  Active  Participation Quality:  Attentive  Affect:  Appropriate  Cognitive:  Appropriate  Insight: Good  Engagement in Group:  Engaged  Modes of Intervention:  Discussion  Additional Comments:  Pt mentioned he had a pretty good day. Pt goal for tomorrow is to be better than what he was today.    Merlinda FrederickKeshia S Makayah Pauli 09/22/2015, 1:04 AM

## 2015-09-22 NOTE — BHH Group Notes (Signed)
B[facility] LCSW Group Therapy Group Therapy:  [facility]  1:15 - 2: 30 PM   Date:  09/22/2015 Time:  2:14 PM   Type of Therapy: Group Therapy  Participation Level: Appropriate  Participation Quality:Invited, chose not to attend   Santa GeneraAnne Jarred Purtee, LCSW Lead Clinical Social Worker Phone:  [phone]

## 2015-09-22 NOTE — [facility] Group Notes (Signed)
[facility]   09/22/2015 10:37 AM  Participation Quality:  Invited.  Chose to not attend      Todd Shaw, Todd Shaw

## 2015-09-22 NOTE — Progress Notes (Signed)
Adult Psychoeducational Group Note  Date:  09/22/2015 Time:  10:18 PM  Group Topic/Focus:  Wrap-Up Group:   The focus of this group is to help patients review their daily goal of treatment and discuss progress on daily workbooks.  Participation Level:  Active  Participation Quality:  Appropriate  Affect:  Appropriate  Cognitive:  Appropriate  Insight: Appropriate  Engagement in Group:  Engaged  Modes of Intervention:  Discussion  Additional Comments:  Pt had a good day. Pt goal for tomorrow is to work on discharge planning to go home.   Merlinda FrederickKeshia S Matteson Blue 09/22/2015, 10:18 PM

## 2015-09-22 NOTE — Progress Notes (Signed)
D: Patient denies SI/HI or AVH.  He appears flat and depressed but is cooperative and appropriate with staff and others on the unit.  Pt. Did not attend morning group with Child psychotherapistsocial worker.    A: Patient given emotional support from RN. Patient encouraged to come to staff with concerns and/or questions. Patient's medication routine continued. Patient's orders and plan of care reviewed.   R: Patient remains appropriate and cooperative. Will continue to monitor patient q15 minutes for safety.

## 2015-09-22 NOTE — Progress Notes (Signed)
[facility]  09/22/2015 3:09 PM Todd Shaw  MRN:  [id number] Subjective:  Patient states, "I'm going to be better next time.  You wont have to see me again, I'm going to get better."  Patient made this statement as he remembered this NP from his inpatient stay.  He was discharged just a few days ago from here.  He denies SI HI AVH.  Tolerating meds and no reported ADR's.   Objective:Todd Shaw is a 30 y.o. AA male, single , employed at a [employer] company , [place], who has a past hx of psychosis ( ? Substance induced) who [facility]. Patient seen and chart reviewed.Discussed patient with treatment team.  Denies AH.  He is calm, pleasant and conversant.  Pt continues to need encouragement to attend groups , participate in milieu. Patient visible on the unit . Will continue treatment.  Principal Problem: Major depressive disorder, recurrent episode, severe, with psychotic behavior (HCC) Diagnosis:   Patient Active Problem List   Diagnosis Date Noted  . Major depressive disorder, recurrent episode, severe, with psychotic behavior (HCC) [F33.3] 09/21/2015  . Auditory hallucination [R44.0]   . Attention deficit hyperactivity disorder (ADHD) [F90.9] 09/13/2015  . Cannabis use disorder, moderate, in sustained remission [F12.90] 09/13/2015  . Tobacco use disorder [F17.200] 09/13/2015   Total Time spent with patient: 25 minutes  Past Psychiatric History: Pt was admitted on 09/11/2008 - diagnosis of substance induced psychosis ( cough pill , cannabis ) . Pt follows up with [name]. Pt at that time responded well to Zyprexa.Pt denies past hx of suicide attempts  Past Medical History:  Past Medical History  Diagnosis Date  . Bipolar 1 disorder (HCC)    History reviewed. No pertinent past surgical history. Family History:  Family History  Problem Relation Age of Onset  . 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  History  Alcohol Use No     History  Drug Use No    Social History   Social History  . Marital Status: Single    Spouse Name: N/A  . Number of Children: N/A  . Years of Education: N/A   Social History Main Topics  . Smoking status: Current Every Day Smoker -- 1.00 packs/day    Types: Cigarettes  . Smokeless tobacco: None  . Alcohol Use: No  . Drug Use: No  . Sexual Activity: Not Currently   Other Topics Concern  . None   Social History Narrative   Additional Social History:    Pain Medications: None Reported Prescriptions: Pt reports current compliance with medications Over the Counter: None Reported History of alcohol / drug use?: No history of alcohol / drug abuse Withdrawal Symptoms: Irritability, Agitation    Sleep: Fair  Appetite:  Fair  Current Medications: Current Facility-Administered Medications  Medication Dose Route Frequency Provider Last Rate Last Dose  . acetaminophen (TYLENOL) tablet 650 mg  650 mg Oral Q6H PRN Charm RingsJamison Y Lord, NP      . alum & mag hydroxide-simeth (MAALOX/MYLANTA) 200-200-20 MG/5ML suspension 30 mL  30 mL Oral Q4H PRN Charm RingsJamison Y Lord, NP      . benztropine (COGENTIN) tablet 0.5 mg  0.5 mg Oral QPC  breakfast Jomarie Longs, MD   0.5 mg at 09/22/15 8119  . benztropine (COGENTIN) tablet 0.5 mg  0.5 mg Oral QHS Jomarie Longs, MD   0.5 mg at 09/21/15 2102  . buPROPion (WELLBUTRIN XL) 24 hr tablet 150 mg  150 mg Oral Daily Jomarie Longs, MD   150 mg at 09/22/15 0821  . guaiFENesin (ROBITUSSIN) 100 MG/5ML solution 200 mg  200 mg Oral TID PRN Charm Rings, NP      . haloperidol (HALDOL) tablet 10 mg  10 mg Oral QHS Jomarie Longs, MD   10 mg at 09/21/15 2102  . haloperidol (HALDOL) tablet 5 mg  5 mg Oral QPC breakfast Jomarie Longs, MD   5 mg at 09/22/15 0844  . hydrOXYzine (ATARAX/VISTARIL) tablet 50 mg  50 mg Oral TID Kerry Hough, PA-C   50 mg at 09/22/15 1204  . magnesium hydroxide (MILK OF MAGNESIA) suspension 30 mL  30 mL Oral Daily PRN Charm Rings, NP      . traZODone (DESYREL) tablet 50 mg  50 mg Oral QHS,MR X 1 Kerry Hough, PA-C   50 mg at 09/21/15 2102    Lab Results:  No results found for this or any previous visit (from the past 48 hour(s)).  Blood Alcohol level:  Lab Results  Component Value Date   ETH <5 09/18/2015   ETH <5 09/13/2015    Physical Findings: AIMS: Facial and Oral Movements Muscles of Facial Expression: None, normal Lips and Perioral Area: None, normal Jaw: None, normal Tongue: None, normal,Extremity Movements Upper (arms, wrists, hands, fingers): None, normal Lower (legs, knees, ankles, toes): None, normal, Trunk Movements Neck, shoulders, hips: None, normal, Overall Severity Severity of abnormal movements (highest score from questions above): None, normal Incapacitation due to abnormal movements: None, normal Patient's awareness of abnormal movements (rate only patient's report): No Awareness, Dental Status Current problems with teeth and/or dentures?: No Does patient usually wear dentures?: No  CIWA:  CIWA-Ar Total: 0 COWS:  COWS Total Score: 1  Musculoskeletal: Strength & Muscle Tone: within normal limits Gait & Station: normal Patient leans: N/A  Psychiatric Specialty Exam: Review of Systems  Psychiatric/Behavioral: Positive for depression and hallucinations. The patient is nervous/anxious.   All other systems reviewed and are negative.   Blood pressure 117/61, pulse 105, temperature 97.7 F (36.5 C), temperature source Oral, resp. rate 16, height 5' 6.75" (1.695 m), weight 81.647 kg (180 lb).Body mass index is 28.42 kg/(m^2).  General Appearance: Fairly Groomed  Patent attorney::  Poor  Speech:  Normal Rate  Volume:  Decreased  Mood:  Depressed   Affect:  Depressed  Thought Process:  Goal Directed  Orientation:  Full (Time, Place, and Person)  Thought Content:  Hallucinations: Auditory and Rumination improving  Suicidal Thoughts:  No  Homicidal Thoughts:  No  Memory:  Immediate;   Fair Recent;   Fair Remote;   Fair  Judgement:  Impaired  Insight:  Shallow  Psychomotor Activity:  Decreased  Concentration:  Poor  Recall:  [employer] of Knowledge:Fair  Language: Fair  Akathisia:  No  Handed:  Right  AIMS (if indicated):     Assets:  Desire for Improvement  ADL's:  Intact  Cognition: WNL  Sleep:  Number of Hours: 6.75   Treatment Plan Summary: Patient will benefit from continued treatment. Daily contact with patient to assess and evaluate symptoms and progress in treatment and Medication management Cont Haldol 5 mg po bid for psychosis. Cont Cogentin  0.5 mg po bid for EPS. Cont Wellbutrin XL to 150  mg po daily for affective sx. Continue Trazodone 50 mg po qhs prn for sleep. Will make available PRN medications as per agitation protocol. Will monitor vitals,medication compliance and treatment side effects while patient is here.  Will monitor for medical issues as well as call consult as needed.  CSW will continue  working on disposition.  Patient to participate in therapeutic milieu .   Lindwood Qua, NP [facility] 09/22/2015, 3:09 PM Agree with NP Progress Note as above  Nehemiah Massed, MD

## 2015-09-22 NOTE — Plan of Care (Signed)
Problem: Alteration in mood Goal: LTG-Patient reports reduction in suicidal thoughts (Patient reports reduction in suicidal thoughts and is able to verbalize a safety plan for whenever patient is feeling suicidal)  Outcome: Progressing Pt denies SI and verbally contracts for safety.       

## 2015-09-23 MED ORDER — BUPROPION HCL ER (XL) 300 MG PO TB24
300.0000 mg | ORAL_TABLET | Freq: Every day | ORAL | Status: DC
  Administered 2015-09-24: 300 mg via ORAL
  Filled 2015-09-23 (×2): qty 1
  Filled 2015-09-23: qty 7

## 2015-09-23 NOTE — Tx Team (Signed)
Interdisciplinary Treatment Plan Update (Adult)  Date:  09/23/2015  Time Reviewed:  5:26 PM   Progress in Treatment: Attending groups: Yes  Participating in groups:  Yes Taking medication as prescribed:  Yes. Tolerating medication:  Yes. Family/Significant othe contact made: Yes  Patient understands diagnosis:  Yes  AEB asking for help with his symptoms Discussing patient identified problems/goals with staff:  Yes. Medical problems stabilized or resolved:  Yes. Denies suicidal/homicidal ideation: Yes. Issues/concerns per patient self-inventory:  Other:  Discharge Plan or Barriers:  CSW assessing for appropriate referrals. Pt reports that he had gone to [facility] for medication management but has hx of medication noncompliance.   Reason for Continuation of Hospitalization:   Comments: Patient is laying in bed under a blanket and is alert and oriented x4. Patient states that he came into the ED Z"because i wasn't feeling too well." Patient states that he feels like people "are after me." Patient states that he is also having auditory hallucinations but declines to answer more questions.  Will start Haldol 5 mg po daily and 10 mg po qhs for psychosis. Will add Cogentin 0.5 mg po bid for EPS. Will add Wellbutrin XL 150 mg po daily for affective sx. Will add Trazodone 50 mg po qhs for sleep.  Estimated length of stay:  Likely d/c tomorrow  New goal(s):   Additional Comments:  Patient and CSW reviewed pt's identified goals and treatment plan. Patient verbalized understanding and agreed to treatment plan. CSW reviewed [facility] "Discharge Process and Patient Involvement" Form. Pt verbalized understanding of information provided and signed form.    Review of initial/current patient goals per problem list:  1. Goal(s): Patient will participate in aftercare plan  Met: Yes  Target date: at discharge  As evidenced by: Patient will participate within aftercare plan AEB aftercare provider  and housing plan at discharge being identified. 3/7: CSW assessing for appropriate referrals.  09/16/15: States he will stay with his mother, follow up [name] 09/23/15:  States he will stay with a friend as mother said no.  2. Goal (s): Patient will exhibit decreased depressive symptoms and suicidal ideations.  Met: Yes   Target date: at discharge  As evidenced by: Patient will utilize self rating of depression at 3 or below and demonstrate decreased signs of depression or be deemed stable for discharge by MD.   09/21/15:  Rates his depression a 7 today 09/23/15:  Denies depression today  3. Goal(s): Patient will demonstrate decreased signs and symptoms of psychosis.   Met:No  Target date: at discharge  As evidenced by: Patient will demonstrateddecreased signs of psychosis, or be deemed stable for discharge by MD.  09/21/15: Pt reports continuing AVH relating to God, paranoia and confusion 09/23/15:  No signs nor symptoms of psychosis today   Attendees: Patient:   09/23/2015 5:26 PM   Family:   09/23/2015 5:26 PM   Physician:  Ursula Alert  09/23/2015 5:26 PM   Nursing:   Hedy Jacob  09/23/2015 5:26 PM   Clinical Social Worker: Bing Matter Chacra  09/23/2015 5:26 PM    09/23/2015 5:26 PM   Other:  Gerline Legacy Nurse Case Manager 09/23/2015 5:26 PM   Other:   09/23/2015 5:26 PM   Other:   09/23/2015 5:26 PM   Other:  09/23/2015 5:26 PM   Other:  09/23/2015 5:26 PM   Other:  09/23/2015 5:26 PM    09/23/2015 5:26 PM    09/23/2015 5:26 PM    09/23/2015 5:26 PM  09/23/2015 5:26 PM    Scribe for Treatment Team:   Ripley Fraise  09/23/2015 5:26 PM

## 2015-09-23 NOTE — Progress Notes (Signed)
D: Pt has appropriate affect and pleasant mood.  Reports his day has been "pretty good."  Pt reports his goal was "to get better" and that he feels "a whole lot better" than yesterday and that he is "starting to feel like myself."  Pt denies SI/HI, denies hallucinations, denies pain.  Pt attended evening group and has been interacting appropriately with peers. A: Actively listened to pt and offered support and encouragement.  Medications administered per order.   R: Pt is compliant with medications.  Pt verbally contracts for safety.  Will continue to monitor and assess.

## 2015-09-23 NOTE — Plan of Care (Signed)
Problem: Alteration in thought process Goal: LTG-Patient has not harmed self or others in at least 2 days Outcome: Completed/Met Date Met:  09/23/15 Pt has remained free of aggression towards self and others for the past 2 days.

## 2015-09-23 NOTE — Progress Notes (Signed)
[facility] MD Progress Note  09/23/2015 3:32 PM Todd Shaw  MRN:  [id number] Subjective:  Patient states, "I'm fine."   Objective:Todd Shaw is a 30 y.o. AA male, single , employed at a [employer] , lives with parents in [place], who has a past hx of psychosis ( ? Substance induced) who presented as a walk-in to [facility] for psychosis. Patient seen and chart reviewed.Discussed patient with treatment team.  Pt continues to be mostly withdrawn, does attend groups but with a lot of encouragement.  Pt per staff continues to need a lot of support , appears depressed , however is compliant on medications. Will continue treatment.  Principal Problem: Major depressive disorder, recurrent episode, severe, with psychotic behavior (HCC) Diagnosis:   Patient Active Problem List   Diagnosis Date Noted  . Major depressive disorder, recurrent episode, severe, with psychotic behavior (HCC) [F33.3] 09/21/2015  . Auditory hallucination [R44.0]   . Attention deficit hyperactivity disorder (ADHD) [F90.9] 09/13/2015  . Cannabis use disorder, moderate, in sustained remission [F12.90] 09/13/2015  . Tobacco use disorder [F17.200] 09/13/2015   Total Time spent with patient: 20 minutes  Past Psychiatric History: Pt was admitted on 09/11/2008 - diagnosis of substance induced psychosis ( cough pill , cannabis ) . Pt follows up with [name]. Pt at that time responded well to Zyprexa.Pt denies past hx of suicide attempts  Past Medical History:  Past Medical History  Diagnosis Date  . Bipolar 1 disorder (HCC)    History reviewed. No pertinent past surgical history. Family History:  Family History  Problem Relation Age of Onset  . 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  History  Alcohol  Use No     History  Drug Use No    Social History   Social History  . Marital Status: Single    Spouse Name: N/A  . Number of Children: N/A  . Years of Education: N/A   Social History Main Topics  . Smoking status: Current Every Day Smoker -- 1.00 packs/day    Types: Cigarettes  . Smokeless tobacco: None  . Alcohol Use: No  . Drug Use: No  . Sexual Activity: Not Currently   Other Topics Concern  . None   Social History Narrative   Additional Social History:    Pain Medications: None Reported Prescriptions: Pt reports current compliance with medications Over the Counter: None Reported History of alcohol / drug use?: No history of alcohol / drug abuse Withdrawal Symptoms: Irritability, Agitation    Sleep: Fair  Appetite:  Fair  Current Medications: Current Facility-Administered Medications  Medication Dose Route Frequency Provider Last Rate Last Dose  . acetaminophen (TYLENOL) tablet 650 mg  650 mg Oral Q6H PRN Charm Rings, NP      . alum & mag hydroxide-simeth (MAALOX/MYLANTA) 200-200-20 MG/5ML suspension 30 mL  30 mL Oral Q4H PRN Charm Rings, NP      . benztropine (COGENTIN) tablet 0.5 mg  0.5 mg Oral QPC breakfast Jomarie Longs, MD   0.5 mg at 09/23/15 0807  . benztropine (COGENTIN) tablet 0.5 mg  0.5 mg Oral QHS Jomarie Longs, MD   0.5 mg at 09/22/15 2134  . [START ON 09/24/2015] buPROPion (WELLBUTRIN XL) 24 hr tablet 300 mg  300 mg  Oral Daily Esaw Knippel, MD      . guaiFENesin (ROBITUSSIN) 100 MG/5ML solution 200 mg  200 mg Oral TID PRN Charm RingsJamison Y Lord, NP      . haloperidol (HALDOL) tablet 10 mg  10 mg Oral QHS Jomarie LongsSaramma Angelyn Osterberg, MD   10 mg at 09/22/15 2134  . haloperidol (HALDOL) tablet 5 mg  5 mg Oral QPC breakfast Jomarie LongsSaramma Kyaire Gruenewald, MD   5 mg at 09/23/15 [id number]  . hydrOXYzine (ATARAX/VISTARIL) tablet 50 mg  50 mg Oral TID Kerry HoughSpencer E Simon, PA-C   50 mg at 09/23/15 1207  . magnesium hydroxide (MILK OF MAGNESIA) suspension 30 mL  30 mL Oral Daily PRN Charm RingsJamison Y  Lord, NP      . traZODone (DESYREL) tablet 50 mg  50 mg Oral QHS,MR X 1 Kerry HoughSpencer E Simon, PA-C   50 mg at 09/22/15 2134    Lab Results:  No results found for this or any previous visit (from the past 48 hour(s)).  Blood Alcohol level:  Lab Results  Component Value Date   ETH <5 09/18/2015   ETH <5 09/13/2015    Physical Findings: AIMS: Facial and Oral Movements Muscles of Facial Expression: None, normal Lips and Perioral Area: None, normal Jaw: None, normal Tongue: None, normal,Extremity Movements Upper (arms, wrists, hands, fingers): None, normal Lower (legs, knees, ankles, toes): None, normal, Trunk Movements Neck, shoulders, hips: None, normal, Overall Severity Severity of abnormal movements (highest score from questions above): None, normal Incapacitation due to abnormal movements: None, normal Patient's awareness of abnormal movements (rate only patient's report): No Awareness, Dental Status Current problems with teeth and/or dentures?: No Does patient usually wear dentures?: No  CIWA:  CIWA-Ar Total: 0 COWS:  COWS Total Score: 1  Musculoskeletal: Strength & Muscle Tone: within normal limits Gait & Station: normal Patient leans: N/A  Psychiatric Specialty Exam: Review of Systems  Psychiatric/Behavioral: Positive for depression. The patient is nervous/anxious.   All other systems reviewed and are negative.   Blood pressure 116/78, pulse 122, temperature 97.4 F (36.3 C), temperature source Oral, resp. rate 20, height 5' 6.75" (1.695 m), weight 81.647 kg (180 lb).Body mass index is 28.42 kg/(m^2).  General Appearance: Fairly Groomed  Patent attorneyye Contact::  Poor  Speech:  Normal Rate  Volume:  Decreased  Mood:  Depressed  Affect:  Depressed  Thought Process:  Goal Directed  Orientation:  Full (Time, Place, and Person)  Thought Content:  Hallucinations: Auditory and Rumination improving  Suicidal Thoughts:  No  Homicidal Thoughts:  No  Memory:  Immediate;   Fair Recent;    Fair Remote;   Fair  Judgement:  Impaired  Insight:  Shallow  Psychomotor Activity:  Decreased  Concentration:  Poor  Recall:  [employer]Fair  Fund of Knowledge:Fair  Language: Fair  Akathisia:  No  Handed:  Right  AIMS (if indicated):     Assets:  Desire for Improvement  ADL's:  Intact  Cognition: WNL  Sleep:  Number of Hours: 6.75   Treatment Plan Summary:Jmarion Richardson DoppCole is a 30 y.o. AA male, single , employed at a [employer] company , [place], who has a past hx of psychosis ( ? Substance induced) who [facility].Pt will continue to need treatment.  Patient will benefit from continued treatment. Daily contact with patient to assess and evaluate symptoms and progress in treatment and Medication management Continue  Haldol 5 mg po daily and 10 mg po qhs  for psychosis. Continue  Cogentin 0.5 mg po bid for EPS. Will increase Wellbutrin XL to 300 mg po daily for affective sx. Continue Trazodone 50 mg po qhs prn for sleep. Will make available PRN medications as per agitation protocol. Will monitor vitals,medication compliance and treatment side effects while patient is here.  Will monitor for medical issues as well as call consult as needed.  CSW will continue  working on disposition.  Patient to participate in therapeutic milieu .   Fronnie Urton, MD  09/23/2015, 3:32 PM

## 2015-09-23 NOTE — [facility] Group Notes (Signed)
[facility] Group Notes:  (Nursing/MHT/Case Management/Adjunct)  Date:  09/23/2015  Time:  6:58 PM  Type of Therapy:  Nurse Education    Mickie BailElizabeth O Iwenekha 09/23/2015, 6:58 PM

## 2015-09-23 NOTE — Progress Notes (Signed)
Adult Psychoeducational Group Note  Date:  09/23/2015 Time:  9:58 PM  Group Topic/Focus:  Wrap-Up Group:   The focus of this group is to help patients review their daily goal of treatment and discuss progress on daily workbooks.  Participation Level:  Active  Participation Quality:  Appropriate  Affect:  Appropriate  Cognitive:  Appropriate  Insight: Good  Engagement in Group:  Engaged  Modes of Intervention:  Activity  Additional Comments:  Patient rated his day a 9. Goal is just to stay focused.  Natasha MeadKiara M Uriyah Massimo 09/23/2015, 9:58 PM

## 2015-09-23 NOTE — [facility] Group Notes (Signed)
[facility] LCSW Group Therapy  09/23/2015 1:15 pm  Type of Therapy: Process Group Therapy  Participation Level:  Active  Participation Quality:  Appropriate  Affect:  Flat  Cognitive:  Oriented  Insight:  Improving  Engagement in Group:  Limited  Engagement in Therapy:  Limited  Modes of Intervention:  Activity, Clarification, Education, Problem-solving and Support  Summary of Progress/Problems: Today's group addressed the issue of overcoming obstacles.  Patients were asked to identify their biggest obstacle post d/c that stands in the way of their on-going success, and then problem solve as to how to manage this. Invited.  Chose to not attend.  Kourtland Coopman B 09/23/2015   3:36 PM   

## 2015-09-23 NOTE — Progress Notes (Signed)
DAR NOTE: Patient presents with anxious affect and depressed mood.  Denies pain, auditory and visual hallucinations.  Rates depression at 3, hopelessness at 0, and anxiety at 3.  Describes energy level as normal and concentration as good.  Maintained on routine safety checks.  Medications given as prescribed.  Support and encouragement offered as needed.  Attended group and participated.  States goal for today is "focus."  Patient remained isolative to his room.  Minimal interaction with staff and peers.

## 2015-09-24 MED ORDER — TRAZODONE HCL 50 MG PO TABS: 50.0000 mg | ORAL_TABLET | Freq: Every evening | ORAL | Status: DC | PRN

## 2015-09-24 MED ORDER — BUPROPION HCL ER (XL) 300 MG PO TB24: 300.0000 mg | ORAL_TABLET | Freq: Every day | ORAL | Status: DC

## 2015-09-24 MED ORDER — TRAZODONE HCL 50 MG PO TABS
50.0000 mg | ORAL_TABLET | Freq: Every evening | ORAL | Status: DC | PRN
  Filled 2015-09-24: qty 7

## 2015-09-24 MED ORDER — BENZTROPINE MESYLATE 0.5 MG PO TABS: ORAL_TABLET | ORAL | Status: DC

## 2015-09-24 MED ORDER — HYDROXYZINE HCL 50 MG PO TABS: 50.0000 mg | ORAL_TABLET | Freq: Three times a day (TID) | ORAL | Status: DC

## 2015-09-24 MED ORDER — HALOPERIDOL 5 MG PO TABS: ORAL_TABLET | ORAL | Status: DC

## 2015-09-24 MED ORDER — GUAIFENESIN 100 MG/5ML PO LIQD: 200.0000 mg | Freq: Three times a day (TID) | ORAL | Status: DC | PRN

## 2015-09-24 NOTE — Progress Notes (Signed)
  [facility] :  Will you be returning to the same living situation after discharge:  No. At discharge, do you have transportation home?: Yes,  bus pass Do you have the ability to pay for your medications: Yes mental health   Release of information consent forms completed and in the chart;  Patient's signature needed at discharge.  Patient to Follow up at: Follow-up Information    [facility].   Specialty:  Behavioral Health   Why:  Wednesday @1 :25 pm with Dr. June LeapMengistu for med management. If you need to be seen before that, go to the walk-in clinic between 8 and 11AM    Contact information:   [address] [place] [address] [phone]       Next level of care provider has access to [place] Link: No   Safety Planning and Suicide Prevention discussed: Yes,  yes  Have you used any form of tobacco in the last 30 days? (Cigarettes, Smokeless Tobacco, Cigars, and/or Pipes): Yes  Has patient been referred to the Quitline?: Patient refused referral  Patient has been referred for addiction treatment: N/A  Ida Rogueorth, Emri Sample B 09/24/2015, 10:13 AM

## 2015-09-24 NOTE — Progress Notes (Signed)
D: Pt has appropriate affect and pleasant mood.  Reports goal is "to feel better so I can get discharged.  I think I'm getting discharged tomorrow."  Pt reports he feels "pretty good" about potential discharge and that he feels safe to discharge tomorrow.  Reports he will be [facility].  Reports he plans to "make sure I keep taking my meds" [facility]. Pt states he feels "a whole lot better" compared to how he felt when he was admitted.  Pt denies SI/HI, denies hallucinations, denies pain.  Pt has been visible in milieu interacting with peers and staff appropriately.  Pt attended evening group.   A: Actively listened to pt and offered support and encouragement.  Medications administered per order.   R: Pt is compliant with medications.  Pt verbally contracts for safety.  Will continue to monitor and assess.

## 2015-09-24 NOTE — Discharge Summary (Signed)
Physician Discharge Summary Note  Patient:  Todd Shaw is an 30 y.o., male MRN:  [id number] DOB:  [birth year] Patient phone:  [phone] (home)  Patient address:   [address] [place] [address],  Total Time spent with patient: Greater than 30 minutes  Date of Admission:  09/20/2015 Date of Discharge: 09/24/2015  Reason for Admission: Worsening symptoms of depression with psychosis.  Principal Problem: Major depressive disorder, recurrent episode, severe, with psychotic behavior [facility] Discharge Diagnoses: Patient Active Problem List   Diagnosis Date Noted  . Major depressive disorder, recurrent episode, severe, with psychotic behavior (HCC) [F33.3] 09/21/2015  . Auditory hallucination [R44.0]   . Attention deficit hyperactivity disorder (ADHD) [F90.9] 09/13/2015  . Cannabis use disorder, moderate, in sustained remission [F12.90] 09/13/2015  . Tobacco use disorder [F17.200] 09/13/2015    Past Psychiatric History: ADHD, MDD, Auditory hallucinations  Past Medical History:  Past Medical History  Diagnosis Date  . Bipolar 1 disorder (HCC)    History reviewed. No pertinent past surgical history.  Family History:  Family History  Problem Relation Age of Onset  . Mental illness Neg Hx    Family Psychiatric  History:  See H&P  Social History:  History  Alcohol Use No     History  Drug Use No    Social History   Social History  . Marital Status: Single    Spouse Name: N/A  . Number of Children: N/A  . Years of Education: N/A   Social History Main Topics  . Smoking status: Current Every Day Smoker -- 1.00 packs/day    Types: Cigarettes  . Smokeless tobacco: None  . Alcohol Use: No  . Drug Use: No  . Sexual Activity: Not Currently   Other Topics Concern  . None   Social History Narrative   Hospital Course: Gerome Kokesh is a 30 y.o. AA male, single , employed at a [employer] , was staying at a hotel, was recently discharged from [facility] on 09/17/15  after he was stabilized on medications for depression and psychosis, presented to [facility] with severe paranoia, brought in by his sister.  Dariusz was a patient in this hospital from 09-13-15 thru 09-17-15. And during this time frame, he received mood stabilization treatments due to worsening symptoms of depression. He was discharged to follow-up care at the [facility] here in [place], [place] for medication management & routine psychiatric care. However, on 09-20-15, barely 7 days after discharged from this hospital, Elmin presented as a walk-in for admission with his sister complaining of worsening symptoms of depression with psychosis. He was again re-admitted to the [facility] adult unit for further mood stabilization treatments.  After admission assessment, his current presenting symptoms were identified. The medication regimen targeting those symptoms were discussed & initiated. He was medicated & discharged on; Haldol 5 mg & 10 mg respectively for mood control, Cogentin 0.5 mg for EPS, Hydroxyzine 50 mg for anxiety, Wellbutrin XL 300 mg for depression & Trazodone 50 mg for insomnia.  He presented other medical ailments that required treatment. He tolerated his treatment regimen without any adverse effects reported.  As Bartosz's treatment progressed, improvement was monitored & noted by observation of his daily reports of symptom reduction. His emotional & mental status were also monitored by daily self-inventory assessment reports completed by Aurther Loft & the clinical staff. He was evaluated by the treatment team for mood stability and plans for continued recovery after discharge. Antrell's motivation was an integral factor in his mood stability.  He was recommended further treatment options upon discharge by referring & scheduling him an outpatient psychiatric clinic for follow-up visits & medication managment as listed below.     Upon discharge, Elward was both mentally and medically stable for discharge. He is currently  denying SIHI, auditory/visual/tactile hallucinations, delusional thoughts & or paranoia. He was provided with a 7 days worth supply samples of his [facility] discharge medications. He left [facility] in no apparent distress with all personal belongings. Transportation per city bus. [facility] assisted with bus pass.  Physical Findings: AIMS: Facial and Oral Movements Muscles of Facial Expression: None, normal Lips and Perioral Area: None, normal Jaw: None, normal Tongue: None, normal,Extremity Movements Upper (arms, wrists, hands, fingers): None, normal Lower (legs, knees, ankles, toes): None, normal, Trunk Movements Neck, shoulders, hips: None, normal, Overall Severity Severity of abnormal movements (highest score from questions above): None, normal Incapacitation due to abnormal movements: None, normal Patient's awareness of abnormal movements (rate only patient's report): No Awareness, Dental Status Current problems with teeth and/or dentures?: No Does patient usually wear dentures?: No  CIWA:  CIWA-Ar Total: 0 COWS:  COWS Total Score: 1  Musculoskeletal: Strength & Muscle Tone: within normal limits Gait & Station: normal Patient leans: N/A  Psychiatric Specialty Exam: Review of Systems  Constitutional: Negative.   HENT: Negative.   Eyes: Negative.   Respiratory: Negative.   Cardiovascular: Negative.   Gastrointestinal: Negative.   Genitourinary: Negative.   Musculoskeletal: Negative.   Skin: Negative.   Neurological: Negative.   Endo/Heme/Allergies: Negative.   Psychiatric/Behavioral: Positive for depression (Stable), hallucinations (Hx of psychosis) and substance abuse (Hx. Tobacco abuse). Negative for suicidal ideas and memory loss. The patient has insomnia (Stable). The patient is not nervous/anxious.   All other systems reviewed and are negative. See Md's SRA  Blood pressure 95/68, pulse 140, temperature 97.9 F (36.6 C), temperature source Oral, resp. rate 16, height 5' 6.75" (1.695 m),  weight 81.647 kg (180 lb).Body mass index is 28.42 kg/(m^2).  Have you used any form of tobacco in the last 30 days? (Cigarettes, Smokeless Tobacco, Cigars, and/or Pipes): Yes  Has this patient used any form of tobacco in the last 30 days? (Cigarettes, Smokeless Tobacco, Cigars, and/or Pipes):   Blood Alcohol level:  Lab Results  Component Value Date   [facility] <5 09/18/2015   ETH <5 09/13/2015   Metabolic Disorder Labs:  Lab Results  Component Value Date   HGBA1C 5.5 09/13/2015   MPG 111 09/13/2015   Lab Results  Component Value Date   PROLACTIN 30.8* 09/13/2015   Lab Results  Component Value Date   CHOL 141 09/14/2015   TRIG 86 09/14/2015   HDL 40* 09/14/2015   CHOLHDL 3.5 09/14/2015   VLDL 17 09/14/2015   LDLCALC 84 09/14/2015   See Psychiatric Specialty Exam and Suicide Risk Assessment completed by Attending Physician prior to discharge.  Discharge destination:  Home  Is patient on multiple antipsychotic therapies at discharge:  No   Has Patient had three or more failed trials of antipsychotic monotherapy by history:  No  Recommended Plan for Multiple Antipsychotic Therapies: NA    Medication List    TAKE these medications      Indication   benztropine 0.5 MG tablet  Commonly known as:  COGENTIN  Take 1 tablet (0.5 mg) after breakfast & 1 tablet (0.5 mg) at bedtime: For prevention of drug induced tremors.   Indication:  Extrapyramidal Reaction caused by Medications     buPROPion 300 MG 24  hr tablet  Commonly known as:  WELLBUTRIN XL  Take 1 tablet (300 mg total) by mouth daily. For depression   Indication:  Major Depressive Disorder     guaiFENesin 100 MG/5ML liquid  Commonly known as:  ROBITUSSIN  Take 10 mLs (200 mg total) by mouth 3 (three) times daily as needed for cough.   Indication:  Cough     haloperidol 5 MG tablet  Commonly known as:  HALDOL  Take 1 tablet (5 mg) after breakfast & 2 tablets (10 mg) at bedtime: For mood control   Indication:  Mood  control     hydrOXYzine 50 MG tablet  Commonly known as:  ATARAX/VISTARIL  Take 1 tablet (50 mg total) by mouth 3 (three) times daily. For anxiety   Indication:  Anxiety Neurosis, Anxiety     traZODone 50 MG tablet  Commonly known as:  DESYREL  Take 1 tablet (50 mg total) by mouth at bedtime as needed for sleep.   Indication:  Trouble Sleeping       Follow-up Information    [facility].   Specialty:  Behavioral Health   Why:  Wednesday @1 :25 pm with Dr. June LeapMengistu for med management. If you need to be seen before that, go to the walk-in clinic between 8 and 11AM    Contact information:   [address] [place] [address] [phone]      Follow-up recommendations: Activity:  As tolerated Diet: As recommended by your primary care doctor. Keep all scheduled follow-up appointments as recommended.   Comments: Take all your medications as prescribed by your mental healthcare provider. Report any adverse effects and or reactions from your medicines to your outpatient provider promptly. Patient is instructed and cautioned to not engage in alcohol and or illegal drug use while on prescription medicines. In the event of worsening symptoms, patient is instructed to call the crisis hotline, 911 and or go to the nearest ED for appropriate evaluation and treatment of symptoms. Follow-up with your primary care provider for your other medical issues, concerns and or health care needs.    Signed: Sanjuana KavaNwoko, Leeman Johnsey I, NP PMHNP-BC 09/24/2015, 10:15 AM

## 2015-09-24 NOTE — Plan of Care (Signed)
Problem: Alteration in thought process Goal: STG-Patient does not respond to command hallucinations Outcome: Progressing Pt denies having hallucinations and does not appear to be responding to internal stimuli.     

## 2015-09-24 NOTE — [facility] Suicide Risk Assessment (Signed)
[facility] INPATIENT:  Family/Significant Other Suicide Prevention Education  Suicide Prevention Education:  Education Completed; Todd BellowSaundra Farr (mom) [phone],  has been identified by the patient as the family member/significant other with whom the patient will be residing, and identified as the person(s) who will aid the patient in the event of a mental health crisis (suicidal ideations/suicide attempt).  With written consent from the patient, the family member/significant other has been provided the following suicide prevention education, prior to the and/or following the discharge of the patient.  The suicide prevention education provided includes the following:  Suicide risk factors  Suicide prevention and interventions  National Suicide Hotline telephone number  [facility] assessment telephone number  [facility] Emergency Assistance 911  [facility] and/or Residential Mobile Crisis Unit telephone number  Request made of family/significant other to:  Remove weapons (e.g., guns, rifles, knives), all items previously/currently identified as safety concern.    Remove drugs/medications (over-the-counter, prescriptions, illicit drugs), all items previously/currently identified as a safety concern.  The family member/significant other verbalizes understanding of the suicide prevention education information provided.  The family member/significant other agrees to remove the items of safety concern listed above.  Todd Geraldorth, Todd Shaw B 09/24/2015, 10:13 AM

## 2015-09-24 NOTE — [facility] Suicide Risk Assessment (Signed)
[facility] Discharge Suicide Risk Assessment   Principal Problem: Major depressive disorder, recurrent episode, severe, with psychotic behavior (HCC)- improving Discharge Diagnoses:  Patient Active Problem List   Diagnosis Date Noted  . Major depressive disorder, recurrent episode, severe, with psychotic behavior (HCC) [F33.3] 09/21/2015  . Auditory hallucination [R44.0]   . Attention deficit hyperactivity disorder (ADHD) [F90.9] 09/13/2015  . Cannabis use disorder, moderate, in sustained remission [F12.90] 09/13/2015  . Tobacco use disorder [F17.200] 09/13/2015    Total Time spent with patient: 30 minutes  Musculoskeletal: Strength & Muscle Tone: within normal limits Gait & Station: normal Patient leans: N/A  Psychiatric Specialty Exam: Review of Systems  Psychiatric/Behavioral: Positive for substance abuse. Negative for depression, suicidal ideas and hallucinations. The patient is not nervous/anxious.   All other systems reviewed and are negative.   Blood pressure 95/68, pulse 140, temperature 97.9 F (36.6 C), temperature source Oral, resp. rate 16, height 5' 6.75" (1.695 m), weight 81.647 kg (180 lb).Body mass index is 28.42 kg/(m^2).  General Appearance: Casual  Eye Contact::  Fair  Speech:  Normal Rate409  Volume:  Normal  Mood:  Euthymic  Affect:  Congruent  Thought Process:  Coherent  Orientation:  Full (Time, Place, and Person)  Thought Content:  WDL  Suicidal Thoughts:  No  Homicidal Thoughts:  No  Memory:  Immediate;   Fair Recent;   Fair Remote;   Fair  Judgement:  Fair  Insight:  Fair  Psychomotor Activity:  Normal  Concentration:  Fair  Recall:  FiservFair  Fund of Knowledge:Fair  Language: Fair  Akathisia:  No  Handed:  Right  AIMS (if indicated):   0  Assets:  Desire for Improvement  Sleep:  Number of Hours: 6.75  Cognition: WNL  ADL's:  Intact   Mental Status Per Nursing Assessment::   On Admission:     Demographic Factors:  Male  Loss  Factors: NA  Historical Factors: Impulsivity  Risk Reduction Factors:   Positive social support  Continued Clinical Symptoms:  Alcohol/Substance Abuse/Dependencies Previous Psychiatric Diagnoses and Treatments  Cognitive Features That Contribute To Risk:  None    Suicide Risk:  Minimal: No identifiable suicidal ideation.  Patients presenting with no risk factors but with morbid ruminations; may be classified as minimal risk based on the severity of the depressive symptoms  Follow-up Information    [facility]   Specialty:  Behavioral Health   Why:  Wednesday @1 :25 pm with Dr. June LeapMengistu for med management. If you need to be seen before that, go to the walk-in clinic between 8 and 11AM    Contact information:   [address] [place] [address] [phone]       Plan Of Care/Follow-up recommendations:  Activity:  no restrictions Diet:  regular Tests:  as needed Other:  follow up with aftercare  Naomee Nowland, MD 09/24/2015, 9:44 AM

## 2015-09-24 NOTE — Progress Notes (Signed)
Patient  discharged home with samples and prescriptions. Patient was stable and appreciative at that time. All papers and prescriptions were given and valuables returned. Verbal understanding expressed. Denies SI/HI and A/VH. Patient given opportunity to express concerns and ask questions.  

## 2015-11-03 ENCOUNTER — Encounter (HOSPITAL_COMMUNITY): Payer: Self-pay | Admitting: Emergency Medicine

## 2015-11-03 DIAGNOSIS — F329 Major depressive disorder, single episode, unspecified: Secondary | ICD-10-CM | POA: Insufficient documentation

## 2015-11-03 DIAGNOSIS — Z79899 Other long term (current) drug therapy: Secondary | ICD-10-CM | POA: Insufficient documentation

## 2015-11-03 DIAGNOSIS — F419 Anxiety disorder, unspecified: Secondary | ICD-10-CM | POA: Insufficient documentation

## 2015-11-03 DIAGNOSIS — F17210 Nicotine dependence, cigarettes, uncomplicated: Secondary | ICD-10-CM | POA: Insufficient documentation

## 2015-11-03 DIAGNOSIS — F29 Unspecified psychosis not due to a substance or known physiological condition: Secondary | ICD-10-CM | POA: Insufficient documentation

## 2015-11-03 NOTE — ED Notes (Signed)
Pt. presents agitated with anxiety / teary at triage , reports auditory and visual hallucinations onset this week , denies suicidal ideation .

## 2015-11-04 ENCOUNTER — Emergency Department (HOSPITAL_COMMUNITY)
Admission: EM | Admit: 2015-11-04 | Discharge: 2015-11-06 | Disposition: A | Discharge status: Other Acute Inpatient Hospital | Payer: No Typology Code available for payment source | Attending: Emergency Medicine | Admitting: Emergency Medicine

## 2015-11-04 DIAGNOSIS — F29 Unspecified psychosis not due to a substance or known physiological condition: Secondary | ICD-10-CM

## 2015-11-04 HISTORY — DX: Hallucinations, unspecified: R44.3

## 2015-11-04 HISTORY — DX: Unspecified psychosis not due to a substance or known physiological condition: F29

## 2015-11-04 LAB — RAPID URINE DRUG SCREEN, HOSP PERFORMED
AMPHETAMINES: NOT DETECTED
BARBITURATES: NOT DETECTED
BENZODIAZEPINES: NOT DETECTED
Cocaine: NOT DETECTED
Opiates: NOT DETECTED
TETRAHYDROCANNABINOL: NOT DETECTED

## 2015-11-04 LAB — COMPREHENSIVE METABOLIC PANEL
ALBUMIN: 4.4 g/dL (ref 3.5–5.0)
ALK PHOS: 56 U/L (ref 38–126)
ALT: 22 U/L (ref 17–63)
ANION GAP: 12 (ref 5–15)
AST: 24 U/L (ref 15–41)
BUN: 7 mg/dL (ref 6–20)
CALCIUM: 9.5 mg/dL (ref 8.9–10.3)
CHLORIDE: 100 mmol/L — AB (ref 101–111)
CO2: 22 mmol/L (ref 22–32)
Creatinine, Ser: 0.88 mg/dL (ref 0.61–1.24)
GFR calc non Af Amer: >60 mL/min (ref >60)
GLUCOSE: 125 mg/dL — AB (ref 65–99)
Potassium: 3.4 mmol/L — ABNORMAL LOW (ref 3.5–5.1)
SODIUM: 134 mmol/L — AB (ref 135–145)
Total Bilirubin: 1.2 mg/dL (ref 0.3–1.2)
Total Protein: 7.2 g/dL (ref 6.5–8.1)

## 2015-11-04 LAB — CBC WITH DIFFERENTIAL/PLATELET
BASOS PCT: 0 %
Basophils Absolute: 0 10*3/uL (ref 0.0–0.1)
EOS PCT: 1 %
Eosinophils Absolute: 0.1 10*3/uL (ref 0.0–0.7)
HCT: 42.9 % (ref 39.0–52.0)
HEMOGLOBIN: 15.3 g/dL (ref 13.0–17.0)
Lymphocytes Relative: 16 %
Lymphs Abs: 1.6 10*3/uL (ref 0.7–4.0)
MCH: 30.7 pg (ref 26.0–34.0)
MCHC: 35.7 g/dL (ref 30.0–36.0)
MCV: 86.1 fL (ref 78.0–100.0)
Monocytes Absolute: 0.8 10*3/uL (ref 0.1–1.0)
Monocytes Relative: 8 %
NEUTROS PCT: 75 %
Neutro Abs: 7.5 10*3/uL (ref 1.7–7.7)
PLATELETS: 260 10*3/uL (ref 150–400)
RBC: 4.98 MIL/uL (ref 4.22–5.81)
RDW: 12.9 % (ref 11.5–15.5)
WBC: 10.1 10*3/uL (ref 4.0–10.5)

## 2015-11-04 LAB — ETHANOL: Alcohol, Ethyl (B): <5 mg/dL (ref <5)

## 2015-11-04 MED ORDER — TRAZODONE HCL 50 MG PO TABS
50.0000 mg | ORAL_TABLET | Freq: Every evening | ORAL | Status: DC | PRN
  Administered 2015-11-05: 50 mg via ORAL
  Filled 2015-11-04: qty 1

## 2015-11-04 MED ORDER — HALOPERIDOL 5 MG PO TABS
10.0000 mg | ORAL_TABLET | Freq: Every day | ORAL | Status: DC
Start: 2015-11-04 — End: 2015-11-06
  Administered 2015-11-04 – 2015-11-05 (×2): 10 mg via ORAL
  Filled 2015-11-04 (×2): qty 2

## 2015-11-04 MED ORDER — LORAZEPAM 1 MG PO TABS
1.0000 mg | ORAL_TABLET | Freq: Three times a day (TID) | ORAL | Status: DC | PRN
  Administered 2015-11-04: 1 mg via ORAL
  Filled 2015-11-04: qty 1

## 2015-11-04 MED ORDER — ONDANSETRON HCL 4 MG PO TABS
4.0000 mg | ORAL_TABLET | Freq: Three times a day (TID) | ORAL | Status: DC | PRN
  Administered 2015-11-04: 4 mg via ORAL
  Filled 2015-11-04: qty 1

## 2015-11-04 MED ORDER — ACETAMINOPHEN 325 MG PO TABS
650.0000 mg | ORAL_TABLET | ORAL | Status: DC | PRN
  Administered 2015-11-04: 650 mg via ORAL
  Filled 2015-11-04: qty 2

## 2015-11-04 MED ORDER — BUPROPION HCL ER (XL) 150 MG PO TB24
300.0000 mg | ORAL_TABLET | Freq: Every day | ORAL | Status: DC
  Administered 2015-11-04 – 2015-11-06 (×3): 300 mg via ORAL
  Filled 2015-11-04 (×3): qty 2

## 2015-11-04 MED ORDER — HALOPERIDOL 5 MG PO TABS
5.0000 mg | ORAL_TABLET | Freq: Every day | ORAL | Status: DC
  Administered 2015-11-04 – 2015-11-06 (×3): 5 mg via ORAL
  Filled 2015-11-04 (×3): qty 1

## 2015-11-04 MED ORDER — BENZTROPINE MESYLATE 1 MG PO TABS
0.5000 mg | ORAL_TABLET | Freq: Two times a day (BID) | ORAL | Status: DC
  Administered 2015-11-04 – 2015-11-06 (×6): 0.5 mg via ORAL
  Filled 2015-11-04 (×5): qty 1

## 2015-11-04 MED ORDER — HALOPERIDOL LACTATE 5 MG/ML IJ SOLN
5.0000 mg | Freq: Four times a day (QID) | INTRAMUSCULAR | Status: DC | PRN
  Administered 2015-11-04: 5 mg via INTRAMUSCULAR
  Filled 2015-11-04: qty 1

## 2015-11-04 MED ORDER — POTASSIUM CHLORIDE CRYS ER 20 MEQ PO TBCR
40.0000 meq | EXTENDED_RELEASE_TABLET | Freq: Once | ORAL | Status: AC
  Administered 2015-11-04: 40 meq via ORAL
  Filled 2015-11-04: qty 2

## 2015-11-04 MED ORDER — IBUPROFEN 400 MG PO TABS: 600.0000 mg | ORAL_TABLET | Freq: Three times a day (TID) | ORAL | Status: DC | PRN

## 2015-11-04 MED ORDER — HYDROXYZINE HCL 25 MG PO TABS
50.0000 mg | ORAL_TABLET | Freq: Three times a day (TID) | ORAL | Status: DC
  Administered 2015-11-04 – 2015-11-06 (×7): 50 mg via ORAL
  Filled 2015-11-04 (×8): qty 2

## 2015-11-04 MED ORDER — NICOTINE 21 MG/24HR TD PT24
21.0000 mg | MEDICATED_PATCH | Freq: Every day | TRANSDERMAL | Status: DC
  Filled 2015-11-04 (×2): qty 1

## 2015-11-04 NOTE — BH Assessment (Addendum)
Tele Assessment Note   Todd Shaw is an 30 y.o. male. Pt was poor historian. Pt's speech was tangential. Pt had flight of ideas. Pt appeared to have difficulty speaking. Pt reported the following: Auditory hallucinations and SI. Writer attempted to gather further information but Pt stated that it was difficult for him to gather his thoughts and speak. Pt's behavior appeared bizarre.   Collateral Information from EPIC: "Pt. presents agitated with anxiety / teary at triage , reports auditory and visual hallucinations onset this week , denies suicidal ideation . "       Writer consulted with May, NP. Per May Pt meets inpatient criteria. TTS to seek placement.  Diagnosis:  F33.3 MDD, recurrent, with psychotic features    Past Medical History:  Past Medical History  Diagnosis Date  . Bipolar 1 disorder (HCC)   . Psychosis   . Hallucinations     History reviewed. No pertinent past surgical history.  Family History:  Family History  Problem Relation Age of Onset  . Mental illness Neg Hx     Social History:  reports that he has been smoking Cigarettes.  He has been smoking about 0.00 packs per day. He does not have any smokeless tobacco history on file. He reports that he does not drink alcohol or use illicit drugs.  Additional Social History:  Alcohol / Drug Use Pain Medications: unknown Prescriptions: unknown Over the Counter: unknown History of alcohol / drug use?: No history of alcohol / drug abuse Longest period of sobriety (when/how long): unknown  CIWA: CIWA-Ar BP: (!) 126/111 mmHg Pulse Rate: (!) 123 COWS:    PATIENT STRENGTHS: (choose at least two) Communication skills Physical Health  Allergies: No Known Allergies  Home Medications:  (Not in a hospital admission)  OB/GYN Status:  No LMP for male patient.  General Assessment Data Location of Assessment: [facility] ED TTS Assessment: In system Is this a Tele or Face-to-Face Assessment?: Tele Assessment Is this  an Initial Assessment or a Re-assessment for this encounter?: Initial Assessment Marital status: Single Maiden name: NA Is patient pregnant?: No Pregnancy Status: No Living Arrangements: Parent Can pt return to current living arrangement?: Yes Admission Status: Voluntary Is patient capable of signing voluntary admission?: Yes Referral Source: Self/Family/Friend Insurance type: SP     Crisis Care Plan Living Arrangements: Parent Legal Guardian: Other: (self) Name of Psychiatrist: Transport planner Name of Therapist: [name]  Education Status Is patient currently in school?: No Current Grade: Na Highest grade of school patient has completed: NA Name of school: NA Contact person: NA  Risk to self with the past 6 months Suicidal Ideation: No-Not Currently/Within Last 6 Months Has patient been a risk to self within the past 6 months prior to admission? : No Suicidal Intent: No-Not Currently/Within Last 6 Months Has patient had any suicidal intent within the past 6 months prior to admission? : No Is patient at risk for suicide?: No Suicidal Plan?: No Has patient had any suicidal plan within the past 6 months prior to admission? : No Access to Means: No What has been your use of drugs/alcohol within the last 12 months?: unknown Previous Attempts/Gestures: No How many times?: 0 Other Self Harm Risks: NA Triggers for Past Attempts: Unknown Intentional Self Injurious Behavior: None Family Suicide History: Unknown Recent stressful life event(s): Other (Comment) (unknown) Persecutory voices/beliefs?: Yes Depression: No Depression Symptoms: Tearfulness, Isolating, Loss of interest in usual pleasures, Feeling worthless/self pity, Feeling angry/irritable Substance abuse history and/or treatment for substance abuse?: No Suicide  prevention information given to non-admitted patients: Not applicable  Risk to Others within the past 6 months Homicidal Ideation: No Does patient have any lifetime  risk of violence toward others beyond the six months prior to admission? : No Thoughts of Harm to Others: No Current Homicidal Intent: No Current Homicidal Plan: No Access to Homicidal Means: No Identified Victim: NA History of harm to others?: No Assessment of Violence: None Noted Violent Behavior Description: NA Does patient have access to weapons?: No Criminal Charges Pending?: No Does patient have a court date: No Is patient on probation?: No  Psychosis Hallucinations: Auditory Delusions: None noted  Mental Status Report Appearance/Hygiene: Bizarre Eye Contact: Poor Motor Activity: Freedom of movement Speech: Tangential Level of Consciousness: Alert Mood: Fearful, Anxious Affect: Fearful, Anxious Anxiety Level: Severe Thought Processes: Tangential Judgement: Unimpaired Orientation: Unable to assess Obsessive Compulsive Thoughts/Behaviors: None  Cognitive Functioning Concentration: Decreased Memory: Unable to Assess IQ: Average Insight: Unable to Assess Impulse Control: Unable to Assess Appetite: Fair Weight Loss: 0 Weight Gain: 0 Sleep: Unable to Assess Total Hours of Sleep: 0 Vegetative Symptoms: None  ADLScreening [facility] Assessment Services) Patient's cognitive ability adequate to safely complete daily activities?: Yes Patient able to express need for assistance with ADLs?: Yes Independently performs ADLs?: Yes (appropriate for developmental age)  Prior Inpatient Therapy Prior Inpatient Therapy: Yes Prior Therapy Dates: 2017 [facility] Reason for Treatment: hallucinations  Prior Outpatient Therapy Prior Outpatient Therapy: Yes Prior Therapy Dates: unknown Prior Therapy Facilty/Provider(s): unknown Reason for Treatment: unknown Does patient have an ACCT team?: No Does patient have Intensive In-House Services?  : No Does patient have [name] services? : No Does patient have P4CC services?: No  ADL Screening (condition at  time of admission) Patient's cognitive ability adequate to safely complete daily activities?: Yes Is the patient deaf or have difficulty hearing?: No Does the patient have difficulty seeing, even when wearing glasses/contacts?: No Does the patient have difficulty concentrating, remembering, or making decisions?: No Patient able to express need for assistance with ADLs?: Yes Does the patient have difficulty dressing or bathing?: No Independently performs ADLs?: Yes (appropriate for developmental age) Does the patient have difficulty walking or climbing stairs?: No Weakness of Legs: None Weakness of Arms/Hands: None       Abuse/Neglect Assessment (Assessment to be complete while patient is alone) Physical Abuse: Denies Verbal Abuse: Denies Sexual Abuse: Denies Exploitation of patient/patient's resources: Denies Self-Neglect: Denies     Merchant navy officerAdvance Directives (For Healthcare) Does patient have an advance directive?: No Would patient like information on creating an advanced directive?: No - patient declined information    Additional Information 1:1 In Past 12 Months?: No CIRT Risk: No Elopement Risk: No Does patient have medical clearance?: Yes     Disposition:  Disposition Initial Assessment Completed for this Encounter: Yes Disposition of Patient: Inpatient treatment program Type of inpatient treatment program: Adult  Taisia Fantini D 11/04/2015 9:30 AM

## 2015-11-04 NOTE — ED Notes (Signed)
Pt in purple scrubs, belongings removed & inventoried. Locked with security. Security wanded pt.

## 2015-11-04 NOTE — BH Assessment (Signed)
[facility] Assessment Progress Note   At 05:55 clinician attempted to assess patient.  Nurse Delorise Jacksonori and clinician attempted to get patient to awaken but he did not.  Patient to be assessed when more alert and oriented.

## 2015-11-04 NOTE — Progress Notes (Signed)
[facility] due medical.  [place] Abtsatia Sota Hetz, LCSWA Disposition staff 11/04/2015 6:15 PM

## 2015-11-04 NOTE — ED Notes (Signed)
Pt's night medications held. Patient hearing voices and has not been able to sleep lately. Patient is resting comfortably at this time.

## 2015-11-04 NOTE — Progress Notes (Signed)
Seeking inpatient psychiatric treatment for pt. [facility] upon bed availability.  Referred to the following facilities with possible bed openings on high acuity units today: [facility]  Ilean SkillMeghan Jamontae Thwaites, MSW, LCSW Clinical Social Work, Disposition  11/04/2015 [phone]

## 2015-11-04 NOTE — ED Provider Notes (Signed)
CSN: [id number]     Arrival date & time 11/03/15  2344 History   By signing my name below, I, Arlan Organ, attest that this documentation has been prepared under the direction and in the presence of Gilda Crease, MD.  Electronically Signed: Arlan Organ, ED Scribe. 11/04/2015. 2:07 AM.   Chief Complaint  Patient presents with  . Hallucinations   The history is provided by the patient. No language interpreter was used.     LEVEL 5 CAVEAT- PSYCHIATRIC DISORDER   HPI Comments: Todd Shaw is a 30 y.o. male with a PMHx of bipolar 1 disorder and psychosis who presents to the Emergency Department here for auditory and visual hallucinations x 1 week. Pt admits he has not been taking any of his medications as prescribed. Per nurse, pt stated voiced are not telling him to hurt himself or anyone else. However, he would not disclose what the voices were saying. He admits to a history of same.  PCP: No PCP Per Patient    Past Medical History  Diagnosis Date  . Bipolar 1 disorder (HCC)   . Psychosis   . Hallucinations    History reviewed. No pertinent past surgical history. Family History  Problem Relation Age of Onset  . Mental illness Neg Hx    Social History  Substance Use Topics  . Smoking status: Current Every Day Smoker -- 0.00 packs/day    Types: Cigarettes  . Smokeless tobacco: None  . Alcohol Use: No    Review of Systems  Unable to perform ROS: Psychiatric disorder      Allergies  Review of patient's allergies indicates no known allergies.  Home Medications   Prior to Admission medications   Medication Sig Start Date End Date Taking? Authorizing Provider  benztropine (COGENTIN) 0.5 MG tablet Take 1 tablet (0.5 mg) after breakfast & 1 tablet (0.5 mg) at bedtime: For prevention of drug induced tremors. 09/24/15   Sanjuana Kava, NP  buPROPion (WELLBUTRIN XL) 300 MG 24 hr tablet Take 1 tablet (300 mg total) by mouth daily. For depression 09/24/15   Sanjuana Kava,  NP  guaiFENesin (ROBITUSSIN) 100 MG/5ML liquid Take 10 mLs (200 mg total) by mouth 3 (three) times daily as needed for cough. 09/24/15   Sanjuana Kava, NP  haloperidol (HALDOL) 5 MG tablet Take 1 tablet (5 mg) after breakfast & 2 tablets (10 mg) at bedtime: For mood control 09/24/15   Sanjuana Kava, NP  hydrOXYzine (ATARAX/VISTARIL) 50 MG tablet Take 1 tablet (50 mg total) by mouth 3 (three) times daily. For anxiety 09/24/15   Sanjuana Kava, NP  traZODone (DESYREL) 50 MG tablet Take 1 tablet (50 mg total) by mouth at bedtime as needed for sleep. 09/24/15   Sanjuana Kava, NP   Triage Vitals: BP 128/73 mmHg  Pulse 92  Temp(Src) 98.3 F (36.8 C) (Oral)  Resp 18  Ht  (1.702 m)  Wt 181 lb 5 oz (82.243 kg)  BMI 28.39 kg/m2  SpO2 99%   Physical Exam  Constitutional: He is oriented to person, place, and time. He appears well-developed and well-nourished. He appears distressed.  HENT:  Head: Normocephalic and atraumatic.  Right Ear: Hearing normal.  Left Ear: Hearing normal.  Nose: Nose normal.  Mouth/Throat: Oropharynx is clear and moist and mucous membranes are normal.  Eyes: Conjunctivae and EOM are normal. Pupils are equal, round, and reactive to light.  Neck: Normal range of motion. Neck supple.  Cardiovascular: Regular rhythm,  S1 normal and S2 normal.  Exam reveals no gallop and no friction rub.   No murmur heard. Pulmonary/Chest: Effort normal and breath sounds normal. No respiratory distress. He exhibits no tenderness.  Abdominal: Soft. Normal appearance and bowel sounds are normal. There is no hepatosplenomegaly. There is no tenderness. There is no rebound, no guarding, no tenderness at McBurney's point and negative Murphy's sign. No hernia.  Musculoskeletal: Normal range of motion.  Neurological: He is alert and oriented to person, place, and time. He has normal strength. No cranial nerve deficit or sensory deficit. Coordination normal. GCS eye subscore is 4. GCS verbal subscore is  5. GCS motor subscore is 6.  Skin: Skin is warm, dry and intact. No rash noted. No cyanosis.  Psychiatric: His mood appears anxious. He is agitated and actively hallucinating. He expresses no homicidal and no suicidal ideation. He is noncommunicative.  Nursing note and vitals reviewed.   ED Course  Procedures (including critical care time)  DIAGNOSTIC STUDIES: Oxygen Saturation is 98% on RA, Normal by my interpretation.    COORDINATION OF CARE: 2:07 AM- Will order blood work and urine rapid drug screen. Discussed treatment plan with pt at bedside and pt agreed to plan.     Labs Review Labs Reviewed  COMPREHENSIVE METABOLIC PANEL - Abnormal; Notable for the following:    Sodium 134 (*)    Potassium 3.4 (*)    Chloride 100 (*)    Glucose, Bld 125 (*)    All other components within normal limits  ETHANOL  URINE RAPID DRUG SCREEN, HOSP PERFORMED  CBC WITH DIFFERENTIAL/PLATELET    Imaging Review No results found. I have personally reviewed and evaluated these images and lab results as part of my medical decision-making.   EKG Interpretation None      MDM   Final diagnoses:  Psychosis, unspecified psychosis type    Presents to the emergency department for evaluation of agitation and hallucinations. Patient reports auditory and visual hallucinations that started earlier this week. He reports that the auditory hallucinations are voices, but will not comment on what they are saying. He did deny suicidal ideation. Patient is very agitated in the emergency department and will not answer all questions. He clearly is responding to internal stimuli. He will require psychiatric evaluation. Medical workup negative, patient medically clear for psychiatric treatment.  I personally performed the services described in this documentation, which was scribed in my presence. The recorded information has been reviewed and is accurate.   Gilda Creasehristopher J Pollina, MD 11/05/15 [id number]

## 2015-11-05 NOTE — ED Notes (Signed)
Pt to 3W33 for shower. Sitter to escort. Room cleaned and linen changed

## 2015-11-05 NOTE — Progress Notes (Signed)
Continuing to seek inpatient treatment bed for pt. Under review at [facility] (would need to be IVC'd prior to transfer based on psychosis and SI per facility request), and also considered for [facility] and [facility] upon appropriate bed availability.  [facility] declined at [facility] due to medical reasons, not specified- see previous CSW note.   All other facilities contacted are at capacity at this time and not accepting referrals. [facility] advised call back this afternoon in case there have been discharges.  Ilean SkillMeghan Onika Gudiel, MSW, LCSW Clinical Social Work, Disposition  11/05/2015 [phone]

## 2015-11-05 NOTE — ED Notes (Signed)
Called pharmacy have not received medication via tube station. Stated medication sent will send another via tube station.

## 2015-11-05 NOTE — ED Notes (Signed)
Food tray given to patient 

## 2015-11-05 NOTE — ED Notes (Signed)
Spoke with pharmacy pyxis out of vistaril. Will be sending medication via tube station.

## 2015-11-06 ENCOUNTER — Inpatient Hospital Stay (HOSPITAL_COMMUNITY)
Admission: AD | Admit: 2015-11-06 | Discharge: 2015-11-11 | DRG: 885 | Disposition: A | Discharge status: Home / Self Care | Payer: No Typology Code available for payment source | Attending: Psychiatry | Admitting: Psychiatry

## 2015-11-06 ENCOUNTER — Encounter (HOSPITAL_COMMUNITY): Payer: Self-pay | Admitting: *Deleted

## 2015-11-06 DIAGNOSIS — F333 Major depressive disorder, recurrent, severe with psychotic symptoms: Principal | ICD-10-CM | POA: Diagnosis present

## 2015-11-06 DIAGNOSIS — F411 Generalized anxiety disorder: Secondary | ICD-10-CM | POA: Diagnosis present

## 2015-11-06 DIAGNOSIS — F1220 Cannabis dependence, uncomplicated: Secondary | ICD-10-CM | POA: Diagnosis present

## 2015-11-06 DIAGNOSIS — F1221 Cannabis dependence, in remission: Secondary | ICD-10-CM | POA: Diagnosis present

## 2015-11-06 DIAGNOSIS — F17200 Nicotine dependence, unspecified, uncomplicated: Secondary | ICD-10-CM | POA: Diagnosis not present

## 2015-11-06 DIAGNOSIS — F909 Attention-deficit hyperactivity disorder, unspecified type: Secondary | ICD-10-CM | POA: Diagnosis present

## 2015-11-06 DIAGNOSIS — G4700 Insomnia, unspecified: Secondary | ICD-10-CM | POA: Diagnosis present

## 2015-11-06 DIAGNOSIS — F17210 Nicotine dependence, cigarettes, uncomplicated: Secondary | ICD-10-CM | POA: Diagnosis present

## 2015-11-06 DIAGNOSIS — R440 Auditory hallucinations: Secondary | ICD-10-CM | POA: Diagnosis present

## 2015-11-06 MED ORDER — BUPROPION HCL ER (XL) 300 MG PO TB24
300.0000 mg | ORAL_TABLET | Freq: Every day | ORAL | Status: DC
  Administered 2015-11-06 – 2015-11-09 (×4): 300 mg via ORAL
  Filled 2015-11-06 (×8): qty 1

## 2015-11-06 MED ORDER — ALUM & MAG HYDROXIDE-SIMETH 200-200-20 MG/5ML PO SUSP: 30.0000 mL | ORAL | Status: DC | PRN

## 2015-11-06 MED ORDER — BENZTROPINE MESYLATE 0.5 MG PO TABS
0.5000 mg | ORAL_TABLET | Freq: Two times a day (BID) | ORAL | Status: DC
  Administered 2015-11-06 – 2015-11-11 (×10): 0.5 mg via ORAL
  Filled 2015-11-06: qty 14
  Filled 2015-11-06 (×4): qty 1
  Filled 2015-11-06: qty 14
  Filled 2015-11-06: qty 1
  Filled 2015-11-06: qty 14
  Filled 2015-11-06 (×5): qty 1
  Filled 2015-11-06: qty 14
  Filled 2015-11-06 (×4): qty 1

## 2015-11-06 MED ORDER — TRAZODONE HCL 50 MG PO TABS
50.0000 mg | ORAL_TABLET | Freq: Every evening | ORAL | Status: DC | PRN
  Administered 2015-11-06 – 2015-11-07 (×2): 50 mg via ORAL
  Filled 2015-11-06: qty 1

## 2015-11-06 MED ORDER — HYDROXYZINE HCL 50 MG PO TABS
50.0000 mg | ORAL_TABLET | Freq: Three times a day (TID) | ORAL | Status: DC
  Administered 2015-11-06 – 2015-11-11 (×16): 50 mg via ORAL
  Filled 2015-11-06: qty 21
  Filled 2015-11-06 (×3): qty 1
  Filled 2015-11-06: qty 21
  Filled 2015-11-06 (×4): qty 1
  Filled 2015-11-06: qty 21
  Filled 2015-11-06: qty 1
  Filled 2015-11-06: qty 21
  Filled 2015-11-06 (×9): qty 1
  Filled 2015-11-06: qty 21
  Filled 2015-11-06: qty 1
  Filled 2015-11-06: qty 21
  Filled 2015-11-06 (×2): qty 1

## 2015-11-06 MED ORDER — ACETAMINOPHEN 325 MG PO TABS: 650.0000 mg | ORAL_TABLET | Freq: Four times a day (QID) | ORAL | Status: DC | PRN

## 2015-11-06 MED ORDER — HALOPERIDOL 5 MG PO TABS
10.0000 mg | ORAL_TABLET | Freq: Every day | ORAL | Status: DC
  Administered 2015-11-06 – 2015-11-10 (×5): 10 mg via ORAL
  Filled 2015-11-06: qty 2
  Filled 2015-11-06: qty 14
  Filled 2015-11-06 (×5): qty 2
  Filled 2015-11-06: qty 14
  Filled 2015-11-06: qty 2

## 2015-11-06 MED ORDER — MAGNESIUM HYDROXIDE 400 MG/5ML PO SUSP: 30.0000 mL | Freq: Every day | ORAL | Status: DC | PRN

## 2015-11-06 MED ORDER — HALOPERIDOL 5 MG PO TABS
5.0000 mg | ORAL_TABLET | Freq: Every day | ORAL | Status: DC
  Administered 2015-11-07 – 2015-11-11 (×5): 5 mg via ORAL
  Filled 2015-11-06: qty 7
  Filled 2015-11-06 (×2): qty 1
  Filled 2015-11-06: qty 7
  Filled 2015-11-06 (×4): qty 1

## 2015-11-06 NOTE — ED Notes (Signed)
Pt signed consent forms - copy to medical records, [facility] and [facility] to [facility].

## 2015-11-06 NOTE — ED Notes (Signed)
Pt has snack on bedside table. States ate breakfast.

## 2015-11-06 NOTE — ED Provider Notes (Signed)
Accepted at [facility] for transfer.  VSS this morning.  Ready for psychiatric transfer.  Linwood DibblesJon Idelia Caudell, MD 11/06/15 1056

## 2015-11-06 NOTE — Progress Notes (Signed)
D: Patient complained of insomnia stated "I need something strong for sleep, trazodone doesn't do much". Denies pain, SI, AH/VH at this time. Patient cooperative and appropriate. No behavioral issues noted.  A: Staff offered support and encouragement. Due/prn meds given as ordered. Every 15 minutes check for safety maintained. Will continue to monitor patient for safety and stability.  R: Patient receptive.

## 2015-11-06 NOTE — Progress Notes (Signed)
[facility] attending Dr. Elna BreslowEappen. Pt can arrive anytime. Report # is [id number].

## 2015-11-06 NOTE — ED Notes (Signed)
Per Meghan, [id number] - Dr Elna BreslowEappen.

## 2015-11-06 NOTE — Tx Team (Addendum)
Initial Interdisciplinary Treatment Plan   PATIENT STRESSORS: Medication change or noncompliance   PATIENT STRENGTHS: Motivation for treatment/growth Physical Health Supportive family/friends   PROBLEM LIST: Problem List/Patient Goals Date to be addressed Date deferred Reason deferred Estimated date of resolution  Psychosis 11/06/2015  11/06/2015   D/C  Anxiety 11/06/2015  11/06/2015   D/C  "being a better person" 11/06/2015  11/06/2015   D/C  "To get stable" 11/06/2015  11/06/2015   D/C  Increased risk for SI                               DISCHARGE CRITERIA:  Ability to meet basic life and health needs Adequate post-discharge living arrangements Improved stabilization in mood, thinking, and/or behavior Motivation to continue treatment in a less acute level of care Need for constant or close observation no longer present Reduction of life-threatening or endangering symptoms to within safe limits  PRELIMINARY DISCHARGE PLAN: Outpatient therapy Return to previous living arrangement  PATIENT/FAMIILY INVOLVEMENT: This treatment plan has been presented to and reviewed with the patient, Todd Shaw.  The patient and family have been given the opportunity to ask questions and make suggestions.  Larry SierrasMiddleton, TaTreka P 11/06/2015, 1:53 PM
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
## 2015-11-06 NOTE — Progress Notes (Signed)
Adult Psychoeducational Group Note  Date:  11/06/2015 Time:  9:33 PM  Group Topic/Focus:  Wrap-Up Group:   The focus of this group is to help patients review their daily goal of treatment and discuss progress on daily workbooks.  Participation Level:  Active  Participation Quality:  Appropriate  Affect:  Appropriate  Cognitive:  Appropriate  Insight: Appropriate  Engagement in Group:  Engaged  Modes of Intervention:  Socialization and Support  Additional Comments:  Patient attended and participated in group tonight. He reports having a good day. He read, went for his meals and attended groups  Scot DockFrancis, Ariyonna Twichell Dacosta 11/06/2015, 9:33 PM

## 2015-11-07 DIAGNOSIS — F333 Major depressive disorder, recurrent, severe with psychotic symptoms: Principal | ICD-10-CM

## 2015-11-07 NOTE — [facility] Group Notes (Signed)
[facility] Group Notes: (Clinical Social Work)   11/07/2015      Type of Therapy:  Group Therapy   Participation Level:  Did Not Attend despite MHT prompting   Ambrose MantleMareida Grossman-Orr, LCSW 11/07/2015, 1:12 PM

## 2015-11-07 NOTE — H&P (Signed)
Psychiatric Admission Assessment Adult  Patient Identification: Todd Shaw MRN:  [id number] Date of Evaluation:  11/07/2015 Chief Complaint: Patient states " I am not even sure.  I lost it.  I stopped my meds."    Principal Diagnosis: Major depressive disorder, recurrent episode, severe, with psychotic behavior (HCC) Diagnosis:   Patient Active Problem List   Diagnosis Date Noted  . Major depressive disorder, recurrent episode, severe, with psychotic behavior (HCC) [F33.3] 09/21/2015  . Auditory hallucination [R44.0]   . Attention deficit hyperactivity disorder (ADHD) [F90.9] 09/13/2015  . Cannabis use disorder, moderate, in sustained remission [F12.90] 09/13/2015  . Tobacco use disorder [F17.200] 09/13/2015   History of Present Illness::Todd Shaw is a 30 y.o. AA male, single , employed at a [employer] , was staying at a hotel,was recently discharged from [facility] on 09/24/15 after he was stabilized on medications for depression and psychosis, presented to [facility] with severe paranoia, brought in by his family.  He feels meds are not working for him  Patient seen and chart reviewed today .Discussed patient with treatment team.  Pt today seen as depressed, withdrawn and paranoid. Pt reports that he felt he "lost it".  Pt reports that he stopped all his medications and he heard voices.  He denies SI and HI at present moment.  Pt reports that he started getting very paranoid and anxious . He reports that he does not know how to explain his thoughts and the way he felt.  Associated Signs/Symptoms: Depression Symptoms:  depressed mood, anhedonia, psychomotor retardation, feelings of worthlessness/guilt, difficulty concentrating, disturbed sleep, (Hypo) Manic Symptoms:  Hallucinations, Impulsivity, Anxiety Symptoms:  denies Psychotic Symptoms:  Hallucinations: Auditory Visual, paranoia PTSD Symptoms: Negative Total Time spent with patient: 45 minutes  Past Psychiatric History: Pt was  recently discharged from [facility] on 09/17/15- MDD with psychosis , was admitted also on  09/11/2008 - diagnosis of substance induced psychosis ( cough pill , cannabis ) . Pt follows up with [name]. Pt denies past hx of suicide attempts.  Is the patient at risk to self? Yes.    Has the patient been a risk to self in the past 6 months? Yes.    Has the patient been a risk to self within the distant past? Yes.    Is the patient a risk to others? Yes.   yes he is psychotic  Has the patient been a risk to others in the past 6 months? Yes.    Has the patient been a risk to others within the distant past? Yes.     Prior Inpatient Therapy:   Prior Outpatient Therapy:    Alcohol Screening: 1. How often do you have a drink containing alcohol?: Never 2. How many drinks containing alcohol do you have on a typical day when you are drinking?: 1 or 2 3. How often do you have six or more drinks on one occasion?: Never Preliminary Score: 0 4. How often during the last year have you found that you were not able to stop drinking once you had started?: Never 5. How often during the last year have you failed to do what was normally expected from you becasue of drinking?: Never 6. How often during the last year have you needed a first drink in the morning to get yourself going after a heavy drinking session?: Never 7. How often during the last year have you had a feeling of guilt of remorse after drinking?: Never 8. How often during the last year have you  been unable to remember what happened the night before because you had been drinking?: Never 9. Have you or someone else been injured as a result of your drinking?: No 10. Has a relative or friend or a doctor or another health worker been concerned about your drinking or suggested you cut down?: No Alcohol Use Disorder Identification Test Final Score (AUDIT): 0 Brief Intervention: AUDIT score less than 7 or less-screening does not suggest unhealthy drinking-brief  intervention not indicated Substance Abuse History in the last 12 months:  Yes.  cannabis in the past - reports he does not abuse it anymore , unknown when was last use Consequences of Substance Abuse: Negative Previous Psychotropic Medications: Yes  Psychological Evaluations: No  Past Medical History:  Past Medical History  Diagnosis Date  . Bipolar 1 disorder (HCC)   . Psychosis   . Hallucinations    History reviewed. No pertinent past surgical history. Family History: Pt denies hx of HTN, seizures, cardiac disease, thyroid do in family. Family History  Problem Relation Age of Onset  . Mental illness Neg Hx    Family Psychiatric  History: Pt denies hx of mental illness, substance abuse in family Tobacco Screening: smokes cigarettes , offered patch Social History: Pt is single and denies hx of legal issues. History  Alcohol Use No     History  Drug Use No    Additional Social History:      Allergies:  No Known Allergies Lab Results: No results found for this or any previous visit (from the past 48 hour(s)).  Blood Alcohol level:  Lab Results  Component Value Date   [facility] <5 11/04/2015   ETH <5 09/18/2015    Metabolic Disorder Labs:  Lab Results  Component Value Date   HGBA1C 5.5 09/13/2015   MPG 111 09/13/2015   Lab Results  Component Value Date   PROLACTIN 30.8* 09/13/2015   Lab Results  Component Value Date   CHOL 141 09/14/2015   TRIG 86 09/14/2015   HDL 40* 09/14/2015   CHOLHDL 3.5 09/14/2015   VLDL 17 09/14/2015   LDLCALC 84 09/14/2015    Current Medications: Current Facility-Administered Medications  Medication Dose Route Frequency Provider Last Rate Last Dose  . acetaminophen (TYLENOL) tablet 650 mg  650 mg Oral Q6H PRN Adonis Brook, NP      . alum & mag hydroxide-simeth (MAALOX/MYLANTA) 200-200-20 MG/5ML suspension 30 mL  30 mL Oral Q4H PRN Adonis Brook, NP      . benztropine (COGENTIN) tablet 0.5 mg  0.5 mg Oral BID Adonis Brook, NP    0.5 mg at 11/07/15 0813  . buPROPion (WELLBUTRIN XL) 24 hr tablet 300 mg  300 mg Oral Daily Adonis Brook, NP   300 mg at 11/07/15 0813  . haloperidol (HALDOL) tablet 10 mg  10 mg Oral QHS Adonis Brook, NP   10 mg at 11/06/15 2112  . haloperidol (HALDOL) tablet 5 mg  5 mg Oral Q breakfast Adonis Brook, NP   5 mg at 11/07/15 1610  . hydrOXYzine (ATARAX/VISTARIL) tablet 50 mg  50 mg Oral TID Adonis Brook, NP   50 mg at 11/07/15 0813  . magnesium hydroxide (MILK OF MAGNESIA) suspension 30 mL  30 mL Oral Daily PRN Adonis Brook, NP      . traZODone (DESYREL) tablet 50 mg  50 mg Oral QHS PRN Adonis Brook, NP   50 mg at 11/06/15 2112   PTA Medications: Prescriptions prior to admission  Medication Sig Dispense Refill Last  Dose  . benztropine (COGENTIN) 0.5 MG tablet Take 1 tablet (0.5 mg) after breakfast & 1 tablet (0.5 mg) at bedtime: For prevention of drug induced tremors. 60 tablet 0   . buPROPion (WELLBUTRIN XL) 300 MG 24 hr tablet Take 1 tablet (300 mg total) by mouth daily. For depression 30 tablet 0   . haloperidol (HALDOL) 5 MG tablet Take 1 tablet (5 mg) after breakfast & 2 tablets (10 mg) at bedtime: For mood control 90 tablet 0   . hydrOXYzine (ATARAX/VISTARIL) 50 MG tablet Take 1 tablet (50 mg total) by mouth 3 (three) times daily. For anxiety 90 tablet 0   . traZODone (DESYREL) 50 MG tablet Take 1 tablet (50 mg total) by mouth at bedtime as needed for sleep. 30 tablet 0   . [DISCONTINUED] guaiFENesin (ROBITUSSIN) 100 MG/5ML liquid Take 10 mLs (200 mg total) by mouth 3 (three) times daily as needed for cough. 120 mL 0     Musculoskeletal: Strength & Muscle Tone: within normal limits Gait & Station: normal Patient leans: Front  Psychiatric Specialty Exam: Physical Exam  Nursing note and vitals reviewed. Constitutional:  I concur with PE done in ED.  Genitourinary:  Denies any sx in this area  Psychiatric: His mood appears anxious. His affect is labile. He is withdrawn. He  exhibits a depressed mood.    Review of Systems  Psychiatric/Behavioral: Positive for depression and hallucinations. The patient is nervous/anxious.   All other systems reviewed and are negative.   Blood pressure 113/75, pulse 125, temperature 97.9 F (36.6 C), temperature source Oral, resp. rate 18, height 5\' 7"  (1.702 m), weight 79.379 kg (175 lb).Body mass index is 27.4 kg/(m^2).  General Appearance: Disheveled  Eye SolicitorContact::  None  Speech:  Slow  Volume:  Decreased  Mood:  Depressed  Affect:  Depressed  Thought Process:  Linear  Orientation:  Full (Time, Place, and Person)  Thought Content:  Hallucinations: Auditory  Suicidal Thoughts:  No is a danger to self or others due to paranoia  Homicidal Thoughts:  No  Memory:  Immediate;   Fair Recent;   Fair Remote;   Fair  Judgement:  Impaired  Insight:  Shallow  Psychomotor Activity:  Decreased  Concentration:  Poor  Recall:  FiservFair  Fund of Knowledge:Fair  Language: Fair  Akathisia:  No  Handed:  Right  AIMS (if indicated):     Assets:  Desire for Improvement  ADL's:  Intact  Cognition: WNL  Sleep:  Number of Hours: 6.75    Treatment Plan Summary:Jaxon Richardson DoppCole is a 30 y.o. AA male, single , employed at a [employer] company , [place], who has a past hx of psychosis, depression , who [facility]  stopped all his medications after DC, decompensated and was brought back to ED by family. Pt will be restarted on his medications, he agrees with same. Will continue treatment.   (Daily contact with patient to assess and evaluate symptoms and progress in treatment and Medication management   Patient will benefit from inpatient treatment and stabilization.  Estimated length of stay is 5-7 days.  Reviewed past medical records,treatment plan.  Haldol 5 mg po daily and 10 mg po qhs for psychosis. Cogentin 0.5 mg po bid for EPS. Wellbutrin XL 150  mg po daily for affective sx. Trazodone 50 mg po qhs  for  sleep. PRN medications as per agitation protocol. Will continue to monitor vitals ,medication compliance and treatment side  effects while patient is here.  Will monitor for medical issues as well as call consult as needed.  Will order routine labs including TSH , lipid panel, PL , Hba1c CSW will start working on disposition.  Patient to participate in therapeutic milieu .   Observation Level/Precautions:  15 minute checks  Lab results:  routine labs including cmp - shows Na+/K+/Cl as low, Glu high 125  BAL<5, UDS- negative.  Psychotherapy:  Individual and group therapy     Consultations:  Social worker  Discharge Concerns:stability and safety         I certify that inpatient services furnished can reasonably be expected to improve the patient's condition.    [facility], NP [facility] 4/30/201711:09 AM

## 2015-11-07 NOTE — [facility] Suicide Risk Assessment (Signed)
[facility] INPATIENT:  Family/Significant Other Suicide Prevention Education  Suicide Prevention Education:  Patient Refusal for Family/Significant Other Suicide Prevention Education: The patient Todd Shaw has refused to provide written consent for family/significant other to be provided Family/Significant Other Suicide Prevention Education during admission and/or prior to discharge.  Physician notified.  Sarina SerGrossman-Orr, Prentis Langdon Jo 11/07/2015, 3:09 PM

## 2015-11-07 NOTE — [facility] Suicide Risk Assessment (Signed)
[facility]   Nursing information obtained from:  Patient Demographic factors:  Adolescent or young adult, Unemployed Current Mental Status:  Noncompliance with meds, AH, depression Loss Factors:  NA Historical Factors:  Prior suicide attempts Risk Reduction Factors:  Living with another person, especially a relative, Positive social support  Total Time spent with patient: 1 hour Principal Problem: Major depressive disorder, recurrent episode, severe, with psychotic behavior (HCC) Diagnosis:   Patient Active Problem List   Diagnosis Date Noted  . Major depressive disorder, recurrent episode, severe, with psychotic behavior (HCC) [F33.3] 09/21/2015  . Auditory hallucination [R44.0]   . Attention deficit hyperactivity disorder (ADHD) [F90.9] 09/13/2015  . Cannabis use disorder, moderate, in sustained remission [F12.90] 09/13/2015  . Tobacco use disorder [F17.200] 09/13/2015   Subjective Data: Pt reports he has been noncompliant with meds. He has been feeling depressed for the last one week. He is having AH of multiple voices that are trying to scare him. He is also having VH of Biblical figures. He is feeling paranoid. Denies SI/HI today. Denies drug and alcohol use.   Continued Clinical Symptoms:  Alcohol Use Disorder Identification Test Final Score (AUDIT): 0 The "Alcohol Use Disorders Identification Test", Guidelines for Use in Primary Care, Second Edition.  World Science writerHealth Organization [facility](WHO). Score between 0-7:  no or low risk or alcohol related problems. Score between 8-15:  moderate risk of alcohol related problems. Score between 16-19:  high risk of alcohol related problems. Score 20 or above:  warrants further diagnostic evaluation for alcohol dependence and treatment.   CLINICAL FACTORS:   Depression:   Impulsivity Severe Currently Psychotic Unstable or Poor Therapeutic Relationship Previous Psychiatric Diagnoses and  Treatments   Musculoskeletal: Strength & Muscle Tone: within normal limits Gait & Station: normal Patient leans: N/A  Psychiatric Specialty Exam: Review of Systems  Psychiatric/Behavioral: Positive for depression and hallucinations. Negative for suicidal ideas and substance abuse.    Blood pressure 113/75, pulse 125, temperature 97.9 F (36.6 C), temperature source Oral, resp. rate 18, height 5\' 7"  (1.702 m), weight 79.379 kg (175 lb).Body mass index is 27.4 kg/(m^2).  General Appearance: Disheveled  Eye Contact::  Poor  Speech:  Slow  Volume:  Decreased  Mood:  Depressed  Affect:  Constricted  Thought Process:  Intact  Orientation:  Full (Time, Place, and Person)  Thought Content:  Delusions, Hallucinations: Auditory Visual and Paranoid Ideation  Suicidal Thoughts:  No  Homicidal Thoughts:  No  Memory:  Immediate;   Good Recent;   Good Remote;   Good  Judgement:  Impaired  Insight:  Lacking  Psychomotor Activity:  Decreased  Concentration:  Poor  Recall:  Fair  Fund of Knowledge:Fair  Language: Fair  Akathisia:  No  Handed:  Right  AIMS (if indicated):     Assets:  Desire for Improvement  Sleep:  Number of Hours: 6.75  Cognition: WNL  ADL's:  Intact    COGNITIVE FEATURES THAT CONTRIBUTE TO RISK:  Closed-mindedness, Polarized thinking and Thought constriction (tunnel vision)    SUICIDE RISK:   Moderate:  Frequent suicidal ideation with limited intensity, and duration, some specificity in terms of plans, no associated intent, good self-control, limited dysphoria/symptomatology, some risk factors present, and identifiable protective factors, including available and accessible social support.  PLAN OF CARE: Patient will benefit from inpatient treatment and stabilization.  Estimated length of stay is 5-7 days.  Reviewed past medical records,treatment plan.  Will start Haldol 5 mg po daily and 10 mg  po qhs for psychosis. Will add Cogentin 0.5 mg po bid for  EPS. Will add Wellbutrin XL 300 mg po daily for affective sx. Will add Trazodone 50 mg po qhs for sleep. Vistaril  po TID prn anxiety Will make available PRN medications as per agitation protocol. Will continue to monitor vitals ,medication compliance and treatment side effects while patient is here.  Will monitor for medical issues as well as call consult as needed.  Will order all routine labs including cmp - shows Na+ as low , will repeat , TSH - wnl, ( 09/13/15) , lipid panel - wnl ( 09/14/15) , PL - slightly elevated at 30.8, Hba1c- wnl ( 09/13/15) ,BAL<5, UDS- negative. CSW will start working on disposition.  Patient to participate in therapeutic milieu .    I certify that inpatient services furnished can reasonably be expected to improve the patient's condition.   Oletta Darter, MD 11/07/2015, 10:27 AM

## 2015-11-07 NOTE — Progress Notes (Signed)
DAR NOTE: Patient presents with anxious affect and depressed mood.  Denies pain, auditory and visual hallucinations.  Rates depression at 5, hopelessness at 5, and anxiety at 5.  Maintained on routine safety checks.  Medications given as prescribed.  Support and encouragement offered as needed.  Attended group and participated.   Patient observed socializing with peers in the dayroom.  Offered no complaint.

## 2015-11-07 NOTE — BHH Group Notes (Signed)
Patient said his goal was a 8. He got out bed.  he did not stay in bed all day like he usual does.He feels better, He came out his shell.

## 2015-11-07 NOTE — BHH Counselor (Signed)
Adult Comprehensive Assessment - UPDATED 11/07/15  Patient ID: Todd Shaw, male DOB: 1986-06-01, 30 y.o. MRN: [id number]  Information Source:    Current Stressors: PATIENT STATES THESE HAVE NOT CHANGED SINCE LAST HOSPITALIZATION Educational / Learning stressors: Pt would like to go back to school  Employment / Job issues: Missing time from work due to being in the hospital  Family Relationships: Conflictual relationship with some family members  Surveyor, quantityinancial / Lack of resources (include bankruptcy): None reported  Housing / Lack of housing: None reported  Physical health (include injuries & life threatening diseases): None reported  Social relationships: None reported  Substance abuse: Pt denies  Bereavement / Loss: None reported   Living/Environment/Situation: REMAINS THE SAME Living Arrangements: Parent (Staying with mother and step-father ) Living conditions (as described by patient or guardian): "It's alright." How long has patient lived in current situation?: About 6 or 7 months. Prior to staying with his parents pt was living with his sister. What is atmosphere in current home: Comfortable  Family History: REMAINS THE SAME Marital status: Single Are you sexually active?: No What is your sexual orientation?: Heterosexual  Has your sexual activity been affected by drugs, alcohol, medication, or emotional stress?: "Not that I know of" Does patient have children?: No  Childhood History: NO CHANGES NEEDED By whom was/is the patient raised?: Mother Additional childhood history information: "Pretty good. I had a pretty good decent life honestly." Description of patient's relationship with caregiver when they were a child: "Pretty good. Close relationship." Patient's description of current relationship with people who raised him/her: Pt still has a close relationship with his mother. Pt reports having little contact with his bio father. How were you disciplined when you  got in trouble as a child/adolescent?: 'I used to get whoopings. No time-out or nothing like that." Does patient have siblings?: Yes Number of Siblings: 5 (2 brothers and 3 sisters ) Description of patient's current relationship with siblings: 'It's alright. Closer to some siblings than others." Did patient suffer any verbal/emotional/physical/sexual abuse as a child?: No Did patient suffer from severe childhood neglect?: No (No childhood neglect but pt reports feeling like the outcast of his familly.) Has patient ever been sexually abused/assaulted/raped as an adolescent or adult?: No Was the patient ever a victim of a crime or a disaster?: No Witnessed domestic violence?: No Has patient been effected by domestic violence as an adult?: No  Education: NO CHANGES NEEDED Highest grade of school patient has completed: Graduated HS Currently a student?: No (Pt would like to go back to school for Counselling psychologistchemical engineering) Name of school: NA Learning disability?: No  Employment/Work Situation: REMAINS THE SAME - PT IS HAVING SOME MEMORY ISSUES AT THE TIME OF THIS ASSESSMENT - AND IT IS UNCLEAR WHETHER HE STILL REMAINS EMPLOYED Employment situation: Employed Where is patient currently employed?: [employer], works as a Location managermachine operator  How long has patient been employed?: About 3 weks  Patient's job has been impacted by current illness: Yes Describe how patient's job has been impacted: Pt reports that some days he would be in deep thought or feel very depressed and not want to go to work What is the longest time patient has a held a job?: Pt cannot recall the longest that he's ever held a job however he does report that every time he has a job he ends up losing it because of mental health concerns. "It's a pattern for me." Where was the patient employed at that time?: NA Has patient  ever been in the [employer]?: No Has patient ever served in combat?: No Did You Receive Any Psychiatric  Treatment/Services While in Equities trader?: (NA) Are There Guns or Other Weapons in Your Home?: No Are These Comptroller?: (NA)  Financial Resources: STATES THERE ARE NO CHANGES Financial resources: Income from employment Does patient have a representative payee or guardian?: No  Alcohol/Substance Abuse: STATES HE HAS NO ALCOHOL OR DRUG PROBLEMS What has been your use of drugs/alcohol within the last 12 months?: Smokes THC occasionally and does not drink.  If attempted suicide, did drugs/alcohol play a role in this?: No Alcohol/Substance Abuse Treatment Hx: Denies past history Has alcohol/substance abuse ever caused legal problems?: No  Social Support System: REMAINS THE SAME Patient's Community Support System: Good Describe Community Support System: Mother  Type of faith/religion: Ephriam Knuckles  How does patient's faith help to cope with current illness?: "Pretty well"  Leisure/Recreation: REMAINS THE SAME Leisure and Hobbies: Play music, play pianio, keyboard, organ, reading   Strengths/Needs: ESSENTIALLY THE SAME What things does the patient do well?: Playing music and working  In what areas does patient struggle / problems for patient: Staying focused and concentrating, would like to become more independent   PT REPORTS THAT HE DID NOT FOLLOW THROUGH WITH HIS AFTERCARE PLAN AFTER HIS LAST DISCHARGE, AND THUS WITHOUT MEDICATIONS STARTED HAVING SYMPTOMS AGAIN  Discharge Plan: REMAINS THE SAME Does patient have access to transportation?: Yes (Family will transport) Will patient be returning to same living situation after discharge?: Yes WILL RETURN TO LIVE WITH MOTHER Currently receiving community mental health services: Yes (From Whom) [name]) WILLING TO GO TO [facility] Does patient have financial barriers related to discharge medications?: No  Summary/Recommendations: Patient is a 30yo male readmitted to the hospital with auditory/visual hallucinations,  disorganized thoughts, and suicidal ideation,  and reports primary trigger for admission was not following up on his aftercare plan so not having medications.  Patient will benefit from crisis stabilization, medication evaluation, group therapy and psychoeducation, in addition to case management for discharge planning. At discharge it is recommended that Patient adhere to the established discharge plan and continue in treatment.  Ambrose Mantle, LCSW 11/07/2015, 3:09 PM

## 2015-11-08 LAB — LIPID PANEL
CHOL/HDL RATIO: 3.8 ratio
CHOLESTEROL: 136 mg/dL (ref 0–200)
HDL: 36 mg/dL — ABNORMAL LOW (ref >40)
LDL Cholesterol: 83 mg/dL (ref 0–99)
TRIGLYCERIDES: 86 mg/dL (ref <150)
VLDL: 17 mg/dL (ref 0–40)

## 2015-11-08 LAB — TSH: TSH: 1.479 u[IU]/mL (ref 0.350–4.500)

## 2015-11-08 MED ORDER — TRAZODONE HCL 100 MG PO TABS
100.0000 mg | ORAL_TABLET | Freq: Every day | ORAL | Status: DC
  Administered 2015-11-08 – 2015-11-10 (×3): 100 mg via ORAL
  Filled 2015-11-08 (×3): qty 1
  Filled 2015-11-08: qty 7
  Filled 2015-11-08: qty 1
  Filled 2015-11-08: qty 7

## 2015-11-08 NOTE — Progress Notes (Signed)
Pt has been up and out of his room all evening.  His primary concern this evening is that he will not be able to sleep tonight and is worried that the Haldol 10 mg and Trazodone 50 mg will not be enough.  He denies SI/HI/AVH at this time. He makes his needs known to staff.  Writer encouraged pt to take the medications he is ordered tonight and if they do not work by midnight, Clinical research associatewriter would contact the provider on call to get another dose of Trazodone.  Pt in agreement with this.  Support and encouragement offered.  Discharge plans are in process.  Safety maintained with q15 minute checks.

## 2015-11-08 NOTE — Progress Notes (Signed)
DAR NOTE: Patient presents with anxious affect and depressed mood.  Denies pain, auditory and visual hallucinations.  Rates depression at 5, hopelessness at 5, and anxiety at 5.  Maintained on routine safety checks.  Medications given as prescribed.  Support and encouragement offered as needed.  Attended group and participated.  States goal for today is "get well."  Patient reports difficulty sleeping at night and requesting medication adjustment to aid in sleeping.  Patient encouraged to participate more in activity and milieu more during the day.

## 2015-11-08 NOTE — Progress Notes (Signed)
[facility] MD Progress Note  11/08/2015 2:19 PM Leandrew Keech  MRN:  [id number] Subjective:  Patient states, "I'm not sleeping and I started hearing voices.'   Objective:Wang Snelling is a 30 y.o. AA male, single , lives with parents in [place], who has a past hx of depression ,presented to [facility] with voices. Patient seen and chart reviewed.Discussed patient with treatment team.  Pt continues to be mostly withdrawn, depressed and reports sleep as restkess. Pt per staff continues to need a lot of support , denies disruptive issues on the unit. Will continue treatment.  Principal Problem: Major depressive disorder, recurrent episode, severe, with psychotic behavior (HCC) Diagnosis:   Patient Active Problem List   Diagnosis Date Noted  . Major depressive disorder, recurrent episode, severe, with psychotic behavior (HCC) [F33.3] 09/21/2015  . Auditory hallucination [R44.0]   . Attention deficit hyperactivity disorder (ADHD) [F90.9] 09/13/2015  . Cannabis use disorder, moderate, in sustained remission [F12.90] 09/13/2015  . Tobacco use disorder [F17.200] 09/13/2015   Total Time spent with patient: 25 minutes  Past Psychiatric History: Pt was admitted on 09/11/2008 - diagnosis of substance induced psychosis ( cough pill , cannabis ) . Pt follows up with [name]. Pt at that time responded well to Zyprexa. Pt was recently admitted to [facility] 09/20/15 - 09/24/15. Pt denies past hx of suicide attempts  Past Medical History:  Past Medical History  Diagnosis Date  . Bipolar 1 disorder (HCC)   . Psychosis   . Hallucinations    History reviewed. No pertinent past surgical history. Family History:  Family History  Problem Relation Age of Onset  . Mental illness Neg Hx    Family Psychiatric  History: Family Psychiatric History: Pt denies hx of mental illness, substance abuse in family Social History: Pt is single , employed at a Radio broadcast assistant , lives with parents in [place] , denies hx of legal issues.  History  Alcohol  Use No     History  Drug Use No    Social History   Social History  . Marital Status: Single    Spouse Name: N/A  . Number of Children: N/A  . Years of Education: N/A   Social History Main Topics  . Smoking status: Current Every Day Smoker -- 0.00 packs/day    Types: Cigarettes  . Smokeless tobacco: None  . Alcohol Use: No  . Drug Use: No  . Sexual Activity: Not Asked   Other Topics Concern  . None   Social History Narrative   Additional Social History:         Sleep: Poor  Appetite:  Fair  Current Medications: Current Facility-Administered Medications  Medication Dose Route Frequency Provider Last Rate Last Dose  . acetaminophen (TYLENOL) tablet 650 mg  650 mg Oral Q6H PRN Adonis Brook, NP      . alum & mag hydroxide-simeth (MAALOX/MYLANTA) 200-200-20 MG/5ML suspension 30 mL  30 mL Oral Q4H PRN Adonis Brook, NP      . benztropine (COGENTIN) tablet 0.5 mg  0.5 mg Oral BID Adonis Brook, NP   0.5 mg at 11/08/15 4098  . buPROPion (WELLBUTRIN XL) 24 hr tablet 300 mg  300 mg Oral Daily Adonis Brook, NP   300 mg at 11/08/15 1191  . haloperidol (HALDOL) tablet 10 mg  10 mg Oral QHS Adonis Brook, NP   10 mg at 11/07/15 2116  . haloperidol (HALDOL) tablet 5 mg  5 mg Oral Q breakfast Adonis Brook, NP   5 mg at 11/08/15 4782  .  hydrOXYzine (ATARAX/VISTARIL) tablet 50 mg  50 mg Oral TID Adonis Brook, NP   50 mg at 11/08/15 1255  . magnesium hydroxide (MILK OF MAGNESIA) suspension 30 mL  30 mL Oral Daily PRN Adonis Brook, NP      . traZODone (DESYREL) tablet 100 mg  100 mg Oral QHS Jomarie Longs, MD        Lab Results:  Results for orders placed or performed during the hospital encounter of 11/06/15 (from the past 48 hour(s))  TSH     Status: None   Collection Time: 11/08/15  6:16 AM  Result Value Ref Range   TSH 1.479 0.350 - 4.500 uIU/mL    Comment: Performed at [facility]  Lipid panel     Status: Abnormal   Collection Time: 11/08/15   6:16 AM  Result Value Ref Range   Cholesterol 136 0 - 200 mg/dL   Triglycerides 86 <409 mg/dL   HDL 36 (L) >81 mg/dL   Total CHOL/HDL Ratio 3.8 RATIO   VLDL 17 0 - 40 mg/dL   LDL Cholesterol 83 0 - 99 mg/dL    Comment:        Total Cholesterol/HDL:CHD Risk Coronary Heart Disease Risk Table                     Men   Women  1/2 Average Risk   3.4   3.3  Average Risk       5.0   4.4  2 X Average Risk   9.6   7.1  3 X Average Risk  23.4   11.0        Use the calculated Patient Ratio above and the CHD Risk Table to determine the patient's CHD Risk.        ATP III CLASSIFICATION (LDL):  <100     mg/dL   Optimal  191-478  mg/dL   Near or Above                    Optimal  130-159  mg/dL   Borderline  295-621  mg/dL   High  >308     mg/dL   Very High Performed at [facility]     Blood Alcohol level:  Lab Results  Component Value Date   [facility] <5 11/04/2015   ETH <5 09/18/2015    Physical Findings: AIMS: Facial and Oral Movements Muscles of Facial Expression: None, normal Lips and Perioral Area: None, normal Jaw: None, normal Tongue: None, normal,Extremity Movements Upper (arms, wrists, hands, fingers): None, normal Lower (legs, knees, ankles, toes): None, normal, Trunk Movements Neck, shoulders, hips: None, normal, Overall Severity Severity of abnormal movements (highest score from questions above): None, normal Incapacitation due to abnormal movements: None, normal Patient's awareness of abnormal movements (rate only patient's report): No Awareness, Dental Status Current problems with teeth and/or dentures?: No Does patient usually wear dentures?: No  CIWA:    COWS:     Musculoskeletal: Strength & Muscle Tone: within normal limits Gait & Station: normal Patient leans: N/A  Psychiatric Specialty Exam: Review of Systems  Psychiatric/Behavioral: Positive for depression, hallucinations and substance abuse. The patient is nervous/anxious and has insomnia.   All  other systems reviewed and are negative.   Blood pressure 99/64, pulse 102, temperature 97.2 F (36.2 C), temperature source Oral, resp. rate 18, height  (1.702 m), weight 79.379 kg (175 lb).Body mass index is 27.4 kg/(m^2).  General Appearance: Fairly Groomed  Eye Contact::  Poor  Speech:  Normal Rate  Volume:  Decreased  Mood:  Depressed  Affect:  Depressed  Thought Process:  Goal Directed  Orientation:  Full (Time, Place, and Person)  Thought Content:  Hallucinations: Auditory and Rumination   Suicidal Thoughts:  No  Homicidal Thoughts:  No  Memory:  Immediate;   Fair Recent;   Fair Remote;   Fair  Judgement:  Impaired  Insight:  Shallow  Psychomotor Activity:  Decreased  Concentration:  Poor  Recall:  [employer]Fair  Fund of Knowledge:Fair  Language: Fair  Akathisia:  No  Handed:  Right  AIMS (if indicated):     Assets:  Desire for Improvement  ADL's:  Intact  Cognition: WNL  Sleep:  Number of Hours: 6.75   Treatment Plan Summary:Creighton Richardson DoppCole is a 30 y.o. AA male, single , employed at a [employer] company , [place], who has a past hx of depression, who [facility].Pt will continue to need treatment.  Patient will benefit from continued treatment. Daily contact with patient to assess and evaluate symptoms and progress in treatment and Medication management Continue  Haldol 5 mg po daily and 10 mg po qhs  for psychosis. Continue  Cogentin 0.5 mg po bid for EPS. Will continue Wellbutrin XL to 300 mg po daily for affective sx. Will increase Trazodone to 100 mg po qhs  for sleep. Will make available PRN medications as per agitation protocol. Will monitor vitals,medication compliance and treatment side effects while patient is here.  Will monitor for medical issues as well as call consult as needed.  CSW will continue  working on disposition.  Patient to participate in therapeutic milieu .   Abia Monaco,  MD  11/08/2015, 2:19 PM

## 2015-11-08 NOTE — Progress Notes (Signed)
Adult Psychoeducational Group Note  Date:  11/08/2015 Time:  9:07 PM  Group Topic/Focus:  Wrap-Up Group:   The focus of this group is to help patients review their daily goal of treatment and discuss progress on daily workbooks.  Participation Level:  Active  Participation Quality:  Appropriate  Affect:  Appropriate  Cognitive:  Appropriate  Insight: Appropriate  Engagement in Group:  Engaged  Modes of Intervention:  Discussion  Additional Comments: The patient expressed that he attended all groups    Octavio Mannshigpen, Rashed Edler Lee 11/08/2015, 9:07 PM

## 2015-11-08 NOTE — Tx Team (Signed)
  Interdisciplinary Treatment Plan Update (Adult)  Date:  11/08/2015  Time Reviewed:  9:15 AM   Progress in Treatment: Attending groups: Yes  Participating in groups:  Yes Taking medication as prescribed:  Yes. Tolerating medication:  Yes. Family/Significant othe contact made:No Patient understands diagnosis:  Yes  AEB asking for help with his symptoms Discussing patient identified problems/goals with staff:  Yes. Medical problems stabilized or resolved:  Yes. Denies suicidal/homicidal ideation: Yes. Issues/concerns per patient self-inventory:  Other:  Discharge Plan or Barriers:  CSW assessing for appropriate referrals. Pt reports that he had gone to [facility] for medication management but has hx of medication noncompliance.   Reason for Continuation of Hospitalization: Depression Hallucinations Medication stabilization  Comments:Todd Shaw is an 30 y.o. male. Pt was poor historian. Pt's speech was tangential. Pt had flight of ideas. Pt appeared to have difficulty speaking. Pt reported the following: Auditory hallucinations and SI.  Continue Haldol 5 mg po daily and 10 mg po qhs for psychosis. Continue Cogentin 0.5 mg po bid for EPS. Will continue Wellbutrin XL to 300 mg po daily for affective sx. Will increase Trazodone to 100 mg po qhs for sleep.  Estimated length of stay:  4-5 days  New goal(s): to develop effective aftercare plan.   Additional Comments:   Review of initial/current patient goals per problem list:  1. Goal(s): Patient will participate in aftercare plan  Met: No  Target date: at discharge  As evidenced by: Patient will participate within aftercare plan AEB aftercare provider and housing plan at discharge being identified. 11/08/15:  Unclear where pt will be staying at d/c; will need to call parents for confirmation.  Furthermore, pt likely has not been following up with outpt treatment  2. Goal (s): Patient will exhibit decreased depressive symptoms  and suicidal ideations.  Met:No   Target date: at discharge  As evidenced by: Patient will utilize self rating of depression at 3 or below and demonstrate decreased signs of depression or be deemed stable for discharge by MD.   11/08/15 Rates his depression a 5 today  3. Goal(s): Patient will demonstrate decreased signs and symptoms of psychosis.   Met:No  Target date: at discharge  As evidenced by: Patient will demonstrateddecreased signs of psychosis, or be deemed stable for discharge by MD.  11/08/15:  [name] AH/VH  Willing to restart meds   Attendees: Patient:   11/08/2015 9:15 AM   Family:   11/08/2015 9:15 AM   Physician:  Ria Clock Eappen  11/08/2015 9:15 AM   Nursing:   Darrol Angel  11/08/2015 9:15 AM   Clinical Social Worker: Ripley Fraise  11/08/2015 9:15 AM    11/08/2015 9:15 AM   Other:  Gerline Legacy Nurse Case Manager 11/08/2015 9:15 AM   Other:   11/08/2015 9:15 AM   Other:   11/08/2015 9:15 AM   Other:  11/08/2015 9:15 AM   Other:  11/08/2015 9:15 AM   Other:  11/08/2015 9:15 AM    11/08/2015 9:15 AM    11/08/2015 9:15 AM    11/08/2015 9:15 AM    11/08/2015 9:15 AM    Scribe for Treatment Team:   Ripley Fraise  11/08/2015 9:15 AM

## 2015-11-08 NOTE — [facility] Group Notes (Signed)
[facility] LCSW Group Therapy  11/08/2015 1:15 pm  Type of Therapy: Process Group Therapy  Participation Level:  Active  Participation Quality:  Appropriate  Affect:  Flat  Cognitive:  Oriented  Insight:  Improving  Engagement in Group:  Limited  Engagement in Therapy:  Limited  Modes of Intervention:  Activity, Clarification, Education, Problem-solving and Support  Summary of Progress/Problems: Today's group addressed the issue of overcoming obstacles.  Patients were asked to identify their biggest obstacle post d/c that stands in the way of their on-going success, and then problem solve as to how to manage this. Stayed the entire time.  Sat quietly.  Minimal interaction.   "I was having problems with decision making.  But now I'm just focused on one day at a time."  Vague.  [name], Dynesha Woolen B 11/08/2015   5:03 PM

## 2015-11-08 NOTE — Progress Notes (Addendum)
D: Pt presents appropriate in affect and pleasant in mood. Pt denies any current SI/HI/AVH. Pt did not appear to be responding to internal stimuli. Pt observed interacting with her peers within the milieu.  A: Writer administered scheduled medications to pt, per MD orders. Continued support and availability as needed was extended to this pt. Staff continues to monitor pt with q7715min checks.  R: No adverse drug reactions noted. Pt receptive to treatment. Pt remains safe at this time.

## 2015-11-09 LAB — PROLACTIN: PROLACTIN: 110.3 ng/mL — AB (ref 4.0–15.2)

## 2015-11-09 LAB — HEMOGLOBIN A1C
Hgb A1c MFr Bld: 5.6 % (ref 4.8–5.6)
Mean Plasma Glucose: 114 mg/dL

## 2015-11-09 MED ORDER — BUPROPION HCL ER (XL) 150 MG PO TB24
450.0000 mg | ORAL_TABLET | Freq: Every day | ORAL | Status: DC
  Administered 2015-11-10 – 2015-11-11 (×2): 450 mg via ORAL
  Filled 2015-11-09: qty 21
  Filled 2015-11-09 (×2): qty 3
  Filled 2015-11-09: qty 21
  Filled 2015-11-09: qty 3

## 2015-11-09 NOTE — Clinical Social Work Note (Signed)
Pt and I attempted to call mother this AM to confirm plan for hospital follow up.  Left vm message.

## 2015-11-09 NOTE — Progress Notes (Signed)
DAR NOTE: Patient presents with anxious affect and depressed mood.  Denies pain, auditory and visual hallucinations.  Rates depression at 5, hopelessness at 5, and anxiety at 5.  Maintained on routine safety checks.  Medications given as prescribed.  Support and encouragement offered as needed.  Attended group and participated.  States goal for today is "get well."  Patient observed socializing with peers in the dayroom.  Offered no complaint.

## 2015-11-09 NOTE — [facility] Group Notes (Signed)
Adult Psychoeducational Group Note  Date:  11/09/2015 Time:  9:02 PM  Group Topic/Focus:  Wrap-Up Group:   The focus of this group is to help patients review their daily goal of treatment and discuss progress on daily workbooks.  Participation Level:  Active  Participation Quality:  Appropriate  Affect:  Appropriate  Cognitive:  Appropriate  Insight: Good  Engagement in Group:  Engaged  Modes of Intervention:  Discussion  Additional Comments:  Pt rated his day an 8 because "it could have been better".  His goal was for his mind to continue to be stable and think regular.  Pt has no discharge plans as of yet.  Caroll RancherLindsay, Nil Xiong A 11/09/2015, 9:02 PM

## 2015-11-09 NOTE — Progress Notes (Signed)
Todd Shaw  11/09/2015 1:57 PM Todd Shaw  MRN:  [id number] Subjective:  Patient states, "voices are still there , but is better.'   Objective:Todd Shaw is a 30 y.o. AA male, single , lives with parents in [place], who has a past hx of depression ,presented to [facility] with voices. Patient seen and chart reviewed.Discussed patient with treatment team.  Pt continues to be mostly withdrawn, needs a lot of encouragement to participate in milieu. Pt continues to appear sad, and endorses AH . Will continue treatment.  Principal Problem: Major depressive disorder, recurrent episode, severe, with psychotic behavior (HCC) Diagnosis:   Patient Active Problem List   Diagnosis Date Noted  . Major depressive disorder, recurrent episode, severe, with psychotic behavior (HCC) [F33.3] 09/21/2015  . Auditory hallucination [R44.0]   . Attention deficit hyperactivity disorder (ADHD) [F90.9] 09/13/2015  . Cannabis use disorder, moderate, in sustained remission [F12.90] 09/13/2015  . Tobacco use disorder [F17.200] 09/13/2015   Total Time spent with patient: 25 minutes  Past Psychiatric History: Pt was admitted on 09/11/2008 - diagnosis of substance induced psychosis ( cough pill , cannabis ) . Pt follows up with [name]. Pt at that time responded well to Zyprexa. Pt was recently admitted to [facility] 09/20/15 - 09/24/15. Pt denies past hx of suicide attempts  Past Medical History:  Past Medical History  Diagnosis Date  . Bipolar 1 disorder (HCC)   . Psychosis   . Hallucinations    History reviewed. No pertinent past surgical history. Family History:  Family History  Problem Relation Age of Onset  . Mental illness Neg Hx    Family Psychiatric  History: Family Psychiatric History: Pt denies hx of mental illness, substance abuse in family Social History: Pt is single , employed at a [employer] broadcast [employer] , lives with parents in [place] , denies hx of legal issues.  History  Alcohol Use No     History  Drug Use No     Social History   Social History  . Marital Status: Single    Spouse Name: N/A  . Number of Children: N/A  . Years of Education: N/A   Social History Main Topics  . Smoking status: Current Every Day Smoker -- 0.00 packs/day    Types: Cigarettes  . Smokeless tobacco: None  . Alcohol Use: No  . Drug Use: No  . Sexual Activity: Not Asked   Other Topics Concern  . None   Social History Narrative   Additional Social History:         Sleep: Fair  Appetite:  Fair  Current Medications: Current Facility-Administered Medications  Medication Dose Route Frequency Provider Last Rate Last Dose  . acetaminophen (TYLENOL) tablet 650 mg  650 mg Oral Q6H PRN Todd Brook, NP      . alum & mag hydroxide-simeth (MAALOX/MYLANTA) 200-200-20 MG/5ML suspension 30 mL  30 mL Oral Q4H PRN Todd Brook, NP      . benztropine (COGENTIN) tablet 0.5 mg  0.5 mg Oral BID Todd Brook, NP   0.5 mg at 11/09/15 0807  . [START ON 11/10/2015] buPROPion (WELLBUTRIN XL) 24 hr tablet 450 mg  450 mg Oral Daily Todd Seeman, MD      . haloperidol (HALDOL) tablet 10 mg  10 mg Oral QHS Todd Brook, NP   10 mg at 11/08/15 2109  . haloperidol (HALDOL) tablet 5 mg  5 mg Oral Q breakfast Todd Brook, NP   5 mg at 11/09/15 4098  . hydrOXYzine (ATARAX/VISTARIL) tablet  50 mg  50 mg Oral TID Todd BrookSheila Agustin, NP   50 mg at 11/09/15 1207  . magnesium hydroxide (MILK OF MAGNESIA) suspension 30 mL  30 mL Oral Daily PRN Todd BrookSheila Agustin, NP      . traZODone (DESYREL) tablet 100 mg  100 mg Oral QHS Todd LongsSaramma Elbie Statzer, MD   100 mg at 11/08/15 2110    Lab Results:  Results for orders placed or performed during the hospital encounter of 11/06/15 (from the past 48 hour(s))  Hemoglobin A1c     Status: None   Collection Time: 11/08/15  6:16 AM  Result Value Ref Range   Hgb A1c MFr Bld 5.6 4.8 - 5.6 %    Comment: (Shaw)         Pre-diabetes: 5.7 - 6.4         Diabetes: >6.4         Glycemic control for adults with  diabetes: <7.0    Mean Plasma Glucose 114 mg/dL    Comment: (Shaw) Performed At: [facility] [address] [place], [place] [id number] Mila HomerHancock William F MD [id number]:[phone] Performed at [facility]   TSH     Status: None   Collection Time: 11/08/15  6:16 AM  Result Value Ref Range   TSH 1.479 0.350 - 4.500 uIU/mL    Comment: Performed at [facility]  Lipid panel     Status: Abnormal   Collection Time: 11/08/15  6:16 AM  Result Value Ref Range   Cholesterol 136 0 - 200 mg/dL   Triglycerides 86 <784<150 mg/dL   HDL 36 (L) >69>40 mg/dL   Total CHOL/HDL Ratio 3.8 RATIO   VLDL 17 0 - 40 mg/dL   LDL Cholesterol 83 0 - 99 mg/dL    Comment:        Total Cholesterol/HDL:CHD Risk Coronary Heart Disease Risk Table                     Men   Women  1/2 Average Risk   3.4   3.3  Average Risk       5.0   4.4  2 X Average Risk   9.6   7.1  3 X Average Risk  23.4   11.0        Use the calculated Patient Ratio above and the CHD Risk Table to determine the patient's CHD Risk.        ATP III CLASSIFICATION (LDL):  <100     mg/dL   Optimal  [id number]  mg/dL   Near or Above                    Optimal  130-159  mg/dL   Borderline  [id number]  mg/dL   High  >010>190     mg/dL   Very High Performed at [facility]   Prolactin     Status: Abnormal   Collection Time: 11/08/15  6:16 AM  Result Value Ref Range   Prolactin 110.3 (H) 4.0 - 15.2 ng/mL    Comment: (Shaw) Performed At: [facility] [address] [place], [place] [id number] Mila HomerHancock William F MD IH:[id number]:[phone] Performed at [facility]     Blood Alcohol level:  Lab Results  Component Value Date   [facility] 11/[facility]2015   ETH <5 09/18/2015    Physical Findings: AIMS: Facial and Oral Movements Muscles of Facial Expression: None, normal Lips and Perioral Area: None, normal Jaw: None,  normal Tongue: None, normal,Extremity Movements Upper (arms, wrists, hands,  fingers): None, normal Lower (legs, knees, ankles, toes): None, normal, Trunk Movements Neck, shoulders, hips: None, normal, Overall Severity Severity of abnormal movements (highest score from questions above): None, normal Incapacitation due to abnormal movements: None, normal Patient's awareness of abnormal movements (rate only patient's report): No Awareness, Dental Status Current problems with teeth and/or dentures?: No Does patient usually wear dentures?: No  CIWA:    COWS:     Musculoskeletal: Strength & Muscle Tone: within normal limits Gait & Station: normal Patient leans: N/A  Psychiatric Specialty Exam: Review of Systems  Psychiatric/Behavioral: Positive for depression, hallucinations and substance abuse. The patient is nervous/anxious.   All other systems reviewed and are negative.   Blood pressure 118/70, pulse 114, temperature 97.8 F (36.6 C), temperature source Oral, resp. rate 20, height  (1.702 m), weight 79.379 kg (175 lb).Body mass index is 27.4 kg/(m^2).  General Appearance: Fairly Groomed  Patent attorney::  Poor  Speech:  Normal Rate  Volume:  Decreased  Mood:  Depressed  Affect:  Depressed  Thought Process:  Goal Directed  Orientation:  Full (Time, Place, and Person)  Thought Content:  Hallucinations: Auditory and Rumination   Suicidal Thoughts:  No  Homicidal Thoughts:  No  Memory:  Immediate;   Fair Recent;   Fair Remote;   Fair  Judgement:  Impaired  Insight:  Shallow  Psychomotor Activity:  Decreased  Concentration:  Poor  Recall:  [employer] of Knowledge:Fair  Language: Fair  Akathisia:  No  Handed:  Right  AIMS (if indicated):     Assets:  Desire for Improvement  ADL's:  Intact  Cognition: WNL  Sleep:  Number of Hours: 6.5   Treatment Plan Summary:Gatlyn Michalowski is a 30 y.o. AA male, single , employed at a [employer] , lives with parents in [place], who has a past hx of depression, who presented as a walk-in to [facility]  for psychosis. Pt continues to be depressed , and has AH , although progressing .Pt will continue to need treatment.  Patient will benefit from continued treatment. Daily contact with patient to assess and evaluate symptoms and progress in treatment and Medication management Continue  Haldol 5 mg po daily and 10 mg po qhs  for psychosis. Continue  Cogentin 0.5 mg po bid for EPS. Will increase Wellbutrin XL to 450 mg po daily for affective sx. Will continue Trazodone 100 mg po qhs  for sleep. Will make available PRN medications as per agitation protocol. Will monitor vitals,medication compliance and treatment side effects while patient is here.  Will monitor for medical issues as well as call consult as needed.  CSW will continue  working on disposition.  Patient to participate in therapeutic milieu .   Brynn Mulgrew, MD  11/09/2015, 1:57 PM

## 2015-11-10 NOTE — Progress Notes (Signed)
D:  Patient's self inventory sheet, patient has fair sleep, sleep medication is helpful.  Good appetite, normal energy level, denied anxiety.  Denied withdrawals.  Denied SI.  Denied physical problems.  Denied pain.  Goal is to feel more focused.  Plans to take medications.  Does have discharge plans. A:  Medications administered per MD orders.  Emotional support and encouragement given patient. R:  Denied SI and HI, contracts for safety.  Denied A/V hallucinations.  Safety maintained with 15 minute checks.

## 2015-11-10 NOTE — Progress Notes (Signed)
D. Pt had been up and visible in milieu this evening, attended and participated in evening group activity. Pt endorsed auditory hallucinations but reports that he is feeling somewhat better. Pt was able to receive all bedtime medications without incident this evening and did not verbalize any complaints of pain. A. Support and encouragement provided. R. Safety maintained, will continue to monitor.

## 2015-11-10 NOTE — [facility] Group Notes (Signed)
[facility] LCSW Group Therapy  11/10/2015 1:41 PM  Type of Therapy: Group Therapy  Participation Level: Active  Participation Quality: Attentive  Affect: Flat  Cognitive: Oriented  Insight: Limited  Engagement in Therapy: Engaged  Modes of Intervention: Discussion and Socialization  Summary of Progress/Problems: Todd HuaDavid from [employer] was here to tell his story of recovery and play his guitar.  Pt was pleasant and stayed the entire time. Dozed off a couple times. 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   Todd Shaw Todd B. Todd Shaw 11/10/2015 1:41 PM

## 2015-11-10 NOTE — Plan of Care (Signed)
Problem: Consults Goal: Anxiety Disorder Patient Education See Patient Education Module for eduction specifics.  Outcome: Progressing Nurse discussed anxiety/depression/coping skills with patient.        

## 2015-11-10 NOTE — Progress Notes (Signed)
[facility] MD Progress Note  11/10/2015 3:41 PM Todd Shaw  MRN:  [id number] Subjective:  Patient states, "I am OK."   Objective:Todd Shaw is a 30 y.o. AA male, single , lives with parents in [place], who has a past hx of depression ,presented to [facility] with voices. Patient seen and chart reviewed.Discussed patient with treatment team.  Pt is less depressed, is more visible in milieu. Pt has been taking his medications , will encourage to participate in milieu.  Principal Problem: Major depressive disorder, recurrent episode, severe, with psychotic behavior (HCC) Diagnosis:   Patient Active Problem List   Diagnosis Date Noted  . Major depressive disorder, recurrent episode, severe, with psychotic behavior (HCC) [F33.3] 09/21/2015  . Auditory hallucination [R44.0]   . Attention deficit hyperactivity disorder (ADHD) [F90.9] 09/13/2015  . Cannabis use disorder, moderate, in sustained remission [F12.90] 09/13/2015  . Tobacco use disorder [F17.200] 09/13/2015   Total Time spent with patient: 25 minutes  Past Psychiatric History: Pt was admitted on 09/11/2008 - diagnosis of substance induced psychosis ( cough pill , cannabis ) . Pt follows up with [name]. Pt at that time responded well to Zyprexa. Pt was recently admitted to [facility] 09/20/15 - 09/24/15. Pt denies past hx of suicide attempts  Past Medical History:  Past Medical History  Diagnosis Date  . Bipolar 1 disorder (HCC)   . Psychosis   . Hallucinations    History reviewed. No pertinent past surgical history. Family History:  Family History  Problem Relation Age of Onset  . Mental illness Neg Hx    Family Psychiatric  History: Family Psychiatric History: Pt denies hx of mental illness, substance abuse in family Social History: Pt is single , employed at a [employer] broadcast assistant , lives with parents in [place] , denies hx of legal issues.  History  Alcohol Use No     History  Drug Use No    Social History   Social History  . Marital Status: Single     Spouse Name: N/A  . Number of Children: N/A  . Years of Education: N/A   Social History Main Topics  . Smoking status: Current Every Day Smoker -- 0.00 packs/day    Types: Cigarettes  . Smokeless tobacco: None  . Alcohol Use: No  . Drug Use: No  . Sexual Activity: Not Asked   Other Topics Concern  . None   Social History Narrative   Additional Social History:         Sleep: Fair  Appetite:  Fair  Current Medications: Current Facility-Administered Medications  Medication Dose Route Frequency Provider Last Rate Last Dose  . acetaminophen (TYLENOL) tablet 650 mg  650 mg Oral Q6H PRN Adonis Brook, NP      . alum & mag hydroxide-simeth (MAALOX/MYLANTA) 200-200-20 MG/5ML suspension 30 mL  30 mL Oral Q4H PRN Adonis Brook, NP      . benztropine (COGENTIN) tablet 0.5 mg  0.5 mg Oral BID Adonis Brook, NP   0.5 mg at 11/10/15 0817  . buPROPion (WELLBUTRIN XL) 24 hr tablet 450 mg  450 mg Oral Daily Fitzpatrick Alberico, MD   450 mg at 11/10/15 0817  . haloperidol (HALDOL) tablet 10 mg  10 mg Oral QHS Adonis Brook, NP   10 mg at 11/09/15 2104  . haloperidol (HALDOL) tablet 5 mg  5 mg Oral Q breakfast Adonis Brook, NP   5 mg at 11/10/15 0818  . hydrOXYzine (ATARAX/VISTARIL) tablet 50 mg  50 mg Oral TID Adonis Brook, NP  50 mg at 11/10/15 1300  . magnesium hydroxide (MILK OF MAGNESIA) suspension 30 mL  30 mL Oral Daily PRN Adonis BrookSheila Agustin, NP      . traZODone (DESYREL) tablet 100 mg  100 mg Oral QHS Jomarie LongsSaramma Meghen Akopyan, MD   100 mg at 11/09/15 2104    Lab Results:  No results found for this or any previous visit (from the past 48 hour(s)).  Blood Alcohol level:  Lab Results  Component Value Date   ETH <5 11/04/2015   ETH <5 09/18/2015    Physical Findings: AIMS: Facial and Oral Movements Muscles of Facial Expression: None, normal Lips and Perioral Area: None, normal Jaw: None, normal Tongue: None, normal,Extremity Movements Upper (arms, wrists, hands, fingers): None,  normal Lower (legs, knees, ankles, toes): None, normal, Trunk Movements Neck, shoulders, hips: None, normal, Overall Severity Severity of abnormal movements (highest score from questions above): None, normal Incapacitation due to abnormal movements: None, normal Patient's awareness of abnormal movements (rate only patient's report): No Awareness, Dental Status Current problems with teeth and/or dentures?: No Does patient usually wear dentures?: No  CIWA:  CIWA-Ar Total: 1 COWS:  COWS Total Score: 1  Musculoskeletal: Strength & Muscle Tone: within normal limits Gait & Station: normal Patient leans: N/A  Psychiatric Specialty Exam: Review of Systems  Psychiatric/Behavioral: Positive for depression, hallucinations and substance abuse. The patient is nervous/anxious.   All other systems reviewed and are negative.   Blood pressure 125/62, pulse 90, temperature 97.8 F (36.6 C), temperature source Oral, resp. rate 18, height 5\' 7"  (1.702 m), weight 79.379 kg (175 lb).Body mass index is 27.4 kg/(m^2).  General Appearance: Fairly Groomed  Patent attorneyye Contact::  Poor  Speech:  Normal Rate  Volume:  Decreased  Mood:  Depressed improving  Affect:  Depressed  Thought Process:  Goal Directed  Orientation:  Full (Time, Place, and Person)  Thought Content:  Rumination improving  Suicidal Thoughts:  No  Homicidal Thoughts:  No  Memory:  Immediate;   Fair Recent;   Fair Remote;   Fair  Judgement:  Impaired  Insight:  Shallow  Psychomotor Activity:  Normal  Concentration:  Fair  Recall:  [employer]  Fund of Knowledge:Fair  Language: Fair  Akathisia:  No  Handed:  Right  AIMS (if indicated):     Assets:  Desire for Improvement  ADL's:  Intact  Cognition: WNL  Sleep:  Number of Hours: 6.75   Treatment Plan Summary:Todd Shaw is a 30 y.o. AA male, single , employed at a [employer] company , lives with parents in [place], who has a past hx of depression, who [facility] as [facility]. Pt continues to improve  .Pt will continue to need treatment.  Patient will benefit from continued treatment. Daily contact with patient to assess and evaluate symptoms and progress in treatment and Medication management Continue  Haldol 5 mg po daily and 10 mg po qhs  for psychosis. Continue  Cogentin 0.5 mg po bid for EPS. Increased Wellbutrin XL to 450 mg po daily for affective sx. Will continue Trazodone 100 mg po qhs  for sleep. Will make available PRN medications as per agitation protocol. Will monitor vitals,medication compliance and treatment side effects while patient is here.  Will monitor for medical issues as well as call consult as needed.  CSW will continue  working on disposition.  Patient to participate in therapeutic milieu .   Doniven Vanpatten, MD  11/10/2015, 3:41 PM

## 2015-11-10 NOTE — Progress Notes (Signed)
Adult Psychoeducational Group Note  Date:  11/10/2015 Time:  9:45 PM  Group Topic/Focus:  Wrap-Up Group:   The focus of this group is to help patients review their daily goal of treatment and discuss progress on daily workbooks.  Participation Level:  Active  Participation Quality:  Appropriate  Affect:  Appropriate  Cognitive:  Appropriate  Insight: Appropriate  Engagement in Group:  Engaged  Modes of Intervention:  Socialization and Support  Additional Comments:  Patient attended and participated in group tonight. He reports having a great day. He reports that when he learn that he was leaving tomorrow he felt much better. He attended his groups and went for meals today.  Lita MainsFrancis, [facility]interia Chisum [facility] 11/10/2015, 9:45 PM

## 2015-11-11 DIAGNOSIS — F17200 Nicotine dependence, unspecified, uncomplicated: Secondary | ICD-10-CM

## 2015-11-11 DIAGNOSIS — F1220 Cannabis dependence, uncomplicated: Secondary | ICD-10-CM

## 2015-11-11 MED ORDER — BUPROPION HCL ER (XL) 450 MG PO TB24: 450.0000 mg | ORAL_TABLET | Freq: Every day | ORAL | Status: DC

## 2015-11-11 MED ORDER — BENZTROPINE MESYLATE 0.5 MG PO TABS: 0.5000 mg | ORAL_TABLET | Freq: Two times a day (BID) | ORAL | Status: DC

## 2015-11-11 MED ORDER — HALOPERIDOL 5 MG PO TABS: 5.0000 mg | ORAL_TABLET | Freq: Every day | ORAL | Status: DC

## 2015-11-11 MED ORDER — HYDROXYZINE HCL 50 MG PO TABS: 50.0000 mg | ORAL_TABLET | Freq: Three times a day (TID) | ORAL | Status: DC

## 2015-11-11 MED ORDER — TRAZODONE HCL 100 MG PO TABS: 100.0000 mg | ORAL_TABLET | Freq: Every day | ORAL | Status: DC

## 2015-11-11 MED ORDER — HALOPERIDOL 10 MG PO TABS: 10.0000 mg | ORAL_TABLET | Freq: Every day | ORAL | Status: DC

## 2015-11-11 NOTE — [facility] Suicide Risk Assessment (Signed)
[facility]   Principal Problem: Major depressive disorder, recurrent episode, severe, with psychotic behavior [facility](HCC) Discharge Diagnoses:  Patient Active Problem List   Diagnosis Date Noted  . Cannabis use disorder, moderate, dependence (HCC) [F12.20] 11/11/2015  . Major depressive disorder, recurrent episode, severe, with psychotic behavior (HCC) [F33.3] 09/21/2015  . Attention deficit hyperactivity disorder (ADHD) [F90.9] 09/13/2015  . Tobacco use disorder [F17.200] 09/13/2015    Total Time spent with patient: 30 minutes  Musculoskeletal: Strength & Muscle Tone: within normal limits Gait & Station: normal Patient leans: N/A  Psychiatric Specialty Exam: Review of Systems  Psychiatric/Behavioral: Positive for substance abuse. Negative for depression. The patient is not nervous/anxious.   All other systems reviewed and are negative.   Blood pressure 125/62, pulse 90, temperature 97.8 F (36.6 C), temperature source Oral, resp. rate 18, height 5\' 7"  (1.702 m), weight 79.379 kg (175 lb).Body mass index is 27.4 kg/(m^2).  General Appearance: Casual  Eye Contact::  Fair  Speech:  Clear and Coherent409  Volume:  Normal  Mood:  Euthymic  Affect:  Appropriate  Thought Process:  Coherent  Orientation:  Full (Time, Place, and Person)  Thought Content:  WDL  Suicidal Thoughts:  No  Homicidal Thoughts:  No  Memory:  Immediate;   Fair Recent;   Fair Remote;   Fair  Judgement:  Fair  Insight:  Fair  Psychomotor Activity:  Normal  Concentration:  Fair  Recall:  FiservFair  Fund of Knowledge:Fair  Language: Fair  Akathisia:  No  Handed:  Right  AIMS (if indicated):   0  Assets:  Desire for Improvement  Sleep:  Number of Hours: 6.75  Cognition: WNL  ADL's:  Intact   Mental Status Per Nursing Assessment::   On Admission:  NA  Demographic Factors:  Male and Unemployed  Loss Factors: Financial problems/change in socioeconomic status  Historical  Factors: Impulsivity  Risk Reduction Factors:   Positive social support  Continued Clinical Symptoms:  Alcohol/Substance Abuse/Dependencies Previous Psychiatric Diagnoses and Treatments  Cognitive Features That Contribute To Risk:  None    Suicide Risk:  Minimal: No identifiable suicidal ideation.  Patients presenting with no risk factors but with morbid ruminations; may be classified as minimal risk based on the severity of the depressive symptoms  Follow-up Information    [facility]   Specialty:  Behavioral Health   Why:  Walk-In Clinic is open Monday-Friday 8AM-3PM.  There are several assessments and you will be there a long time.  Take refreshments.   Contact information:   [address] [place] [address] [phone]       Plan Of Care/Follow-up recommendations:  Activity:  no restrictions Diet:  regular Tests:  Follow up with Prolactin level on an out patient basis Other:  none  Nzinga Ferran, MD 11/11/2015, 10:02 AM

## 2015-11-11 NOTE — Progress Notes (Signed)
D   Pt is appropriate and pleasant   He requested his medications and went to bed without complaint   He was smiling and his behavior was appropriate A   Verbal support given   Medications administered and effectiveness monitored    Q 15 min checks R    Pt safe at present and receptive to verbal support

## 2015-11-11 NOTE — Tx Team (Signed)
  Interdisciplinary Treatment Plan Update (Adult)  Date:  11/11/2015  Time Reviewed:  9:34 AM   Progress in Treatment: Attending groups: Yes  Participating in groups:  Yes Taking medication as prescribed:  Yes. Tolerating medication:  Yes. Family/Significant othe contact made:Yes Patient understands diagnosis:  Yes  AEB asking for help with his symptoms Discussing patient identified problems/goals with staff:  Yes. Medical problems stabilized or resolved:  Yes. Denies suicidal/homicidal ideation: Yes. Issues/concerns per patient self-inventory:  Other:  Discharge Plan or Barriers:  CSW assessing for appropriate referrals. Pt reports that he had gone to [facility] for medication management but has hx of medication noncompliance.   Reason for Continuation of Hospitalization:   Comments:Todd Shaw is an 30 y.o. male. Pt was poor historian. Pt's speech was tangential. Pt had flight of ideas. Pt appeared to have difficulty speaking. Pt reported the following: Auditory hallucinations and SI.  Continue Haldol 5 mg po daily and 10 mg po qhs for psychosis. Continue Cogentin 0.5 mg po bid for EPS. Will continue Wellbutrin XL to 300 mg po daily for affective sx. Will increase Trazodone to 100 mg po qhs for sleep.  Estimated length of stay:  D/C today  New goal(s): to develop effective aftercare plan.   Additional Comments:   Review of initial/current patient goals per problem list:  1. Goal(s): Patient will participate in aftercare plan  Met:   Target date: at discharge  As evidenced by: Patient will participate within aftercare plan AEB aftercare provider and housing plan at discharge being identified. 11/08/15:  Unclear where pt will be staying at d/c; will need to call parents for confirmation.  Furthermore, pt likely has not been following up with outpt treatment 11/11/15:  Stay with brother, follow up [name]  2. Goal (s): Patient will exhibit decreased depressive symptoms and  suicidal ideations.  Met:Yes   Target date: at discharge  As evidenced by: Patient will utilize self rating of depression at 3 or below and demonstrate decreased signs of depression or be deemed stable for discharge by MD.   11/08/15 Rates his depression a 5 today 11/11/15;  Denies depression today  3. Goal(s): Patient will demonstrate decreased signs and symptoms of psychosis.   Met:Yes  Target date: at discharge  As evidenced by: Patient will demonstrateddecreased signs of psychosis, or be deemed stable for discharge by MD.  11/08/15:  [name] AH/VH  Willing to restart meds 11/11/15:  No signs nor symptoms of psychosis today   Attendees: Patient:   11/11/2015 9:34 AM   Family:   11/11/2015 9:34 AM   Physician:  Ursula Alert  11/11/2015 9:34 AM   Nursing:   Darrol Angel  11/11/2015 9:34 AM   Clinical Social Worker: Ripley Fraise  11/11/2015 9:34 AM    11/11/2015 9:34 AM   Other:  Gerline Legacy Nurse Case Manager 11/11/2015 9:34 AM   Other:   11/11/2015 9:34 AM   Other:   11/11/2015 9:34 AM   Other:  11/11/2015 9:34 AM   Other:  11/11/2015 9:34 AM   Other:  11/11/2015 9:34 AM    11/11/2015 9:34 AM    11/11/2015 9:34 AM    11/11/2015 9:34 AM    11/11/2015 9:34 AM    Scribe for Treatment Team:   Ripley Fraise  11/11/2015 9:34 AM

## 2015-11-11 NOTE — Progress Notes (Signed)
  [facility] Adult Case Management Discharge Plan :  [facility]  No. Says he will stay with brother At discharge, do you have transportation home?: Yes,  bus pass Do you have the ability to pay for your medications: Yes,  mental health  Release of information consent forms completed and in the chart;  Patient's signature needed at discharge.  Patient to Follow up at: Follow-up Information    [facility]   Specialty:  Behavioral Health   Why:  Walk-In Clinic is open Monday-Friday 8AM-3PM.  There are several assessments and you will be there a long time.  Take refreshments.   Contact information:   [address] [place] [address] [phone]       Next level of care provider has access to [facility] Link:no  Safety Planning and Suicide Prevention discussed: Yes,  yes  Have you used any form of tobacco in the last 30 days? (Cigarettes, Smokeless Tobacco, Cigars, and/or Pipes): Yes  Has patient been referred to the Quitline?: Patient refused referral  Patient has been referred for addiction treatment: N/A  Ida Rogueorth, Annalee Meyerhoff B 11/11/2015, 9:37 AM

## 2015-11-11 NOTE — Discharge Summary (Signed)
Physician Discharge Summary Note  Patient:  Todd Shaw is an 30 y.o., male MRN:  [id number] DOB:  1985-07-25 Patient phone:  [phone] (home)  Patient address:   [address] [place] [address],  Total Time spent with patient: 30 minutes  Date of Admission:  11/06/2015 Date of Discharge: 11/11/2015  Reason for Admission:  Depression and psychosis  Principal Problem: Major depressive disorder, recurrent episode, severe, with psychotic behavior [facility](HCC) Discharge Diagnoses: Patient Active Problem List   Diagnosis Date Noted  . Cannabis use disorder, moderate, dependence (HCC) [F12.20] 11/11/2015  . Major depressive disorder, recurrent episode, severe, with psychotic behavior (HCC) [F33.3] 09/21/2015  . Attention deficit hyperactivity disorder (ADHD) [F90.9] 09/13/2015  . Tobacco use disorder [F17.200] 09/13/2015    Past Psychiatric History:  See above noted      Past Medical History:  Past Medical History  Diagnosis Date  . Bipolar 1 disorder (HCC)   . Psychosis   . Hallucinations    History reviewed. No pertinent past surgical history. Family History:  Family History  Problem Relation Age of Onset  . Mental illness Neg Hx    Family Psychiatric  History:  See above noted Social History:  History  Alcohol Use No     History  Drug Use No    Social History   Social History  . Marital Status: Single    Spouse Name: N/A  . Number of Children: N/A  . Years of Education: N/A   Social History Main Topics  . Smoking status: Current Every Day Smoker -- 0.00 packs/day    Types: Cigarettes  . Smokeless tobacco: None  . Alcohol Use: No  . Drug Use: No  . Sexual Activity: Not Asked   Other Topics Concern  . None   Social History Narrative    Hospital Course:   Todd Finnerry Mabe is a 30 y.o. AA male, single , employed at a [employer] company, was staying at a hotel, [facility] after he was stabilized on medications for depression and  psychosis, [facility], brought in by his family. He feels meds are not working for him.  Todd Finnerry Rumsey was admitted for Major depressive disorder, recurrent episode, severe, with psychotic behavior (HCC) and crisis management.  He was treated with Haldol 5 mg po daily and 10 mg po qhs for psychosis, Cogentin 0.5 mg po bid for EPS, Wellbutrin XL 450 mg po daily for affective sx, Trazodone 100 mg po qhs for sleep, available PRN medications as per agitation protocol. Patient was monitored for  vitals,medication compliance and treatment side effects while patient .  Medical problems were identified and treated as needed.  Home medications were restarted as appropriate.  Improvement was monitored by observation and Todd Finnerry Golob daily report of symptom reduction.  Emotional and mental status was monitored by daily self inventory reports completed by Todd Finnerry Dennin and clinical staff.  Patient reported continued improvement, denied any new concerns.  Patient had been compliant on medications and denied side effects.  Support and encouragement was provided.    Patient did well during inpatient stay.  At time of discharge, patient rated both depression and anxiety levels to be manageable and minimal.  Patient was able to identify the triggers of emotional crises and de-stabilizations.  Patient identified the positive things in life that would help in dealing with feelings of loss, depression and unhealthy or abusive tendencies.         Aurther Lofterry  Vanblarcom was evaluated by the treatment team for stability and plans for continued recovery upon discharge.  He was offered further treatment options upon discharge including Residential, Intensive Outpatient and Outpatient treatment. He will follow up with agencies listed below/   for medication management and counseling.  Encouraged patient to maintain satisfactory support network and home environment.  Advised to adhere to medication compliance and  outpatient treatment follow up.      Kejuan Bekker motivation was an integral factor for scheduling further treatment.  Employment, transportation, bed availability, health status, family support, and any pending legal issues were also considered during his hospital stay.  Upon completion of this admission the patient was both mentally and medically stable for discharge denying suicidal/homicidal ideation, auditory/visual/tactile hallucinations, delusional thoughts and paranoia.      Physical Findings: AIMS: Facial and Oral Movements Muscles of Facial Expression: None, normal Lips and Perioral Area: None, normal Jaw: None, normal Tongue: None, normal,Extremity Movements Upper (arms, wrists, hands, fingers): None, normal Lower (legs, knees, ankles, toes): None, normal, Trunk Movements Neck, shoulders, hips: None, normal, Overall Severity Severity of abnormal movements (highest score from questions above): None, normal Incapacitation due to abnormal movements: None, normal Patient's awareness of abnormal movements (rate only patient's report): No Awareness, Dental Status Current problems with teeth and/or dentures?: No Does patient usually wear dentures?: No  CIWA:  CIWA-Ar Total: 1 COWS:  COWS Total Score: 1  Musculoskeletal: Strength & Muscle Tone: within normal limits Gait & Station: normal Patient leans: N/A  Psychiatric Specialty Exam:  SEE MDA SRA Review of Systems  All other systems reviewed and are negative.   Blood pressure 125/62, pulse 90, temperature 97.8 F (36.6 C), temperature source Oral, resp. rate 18, height  (1.702 m), weight 79.379 kg (175 lb).Body mass index is 27.4 kg/(m^2).  Have you used any form of tobacco in the last 30 days? (Cigarettes, Smokeless Tobacco, Cigars, and/or Pipes): Yes  Has this patient used any form of tobacco in the last 30 days? (Cigarettes, Smokeless Tobacco, Cigars, and/or Pipes) Yes, N/A  Blood Alcohol level:  Lab Results  Component  Value Date   ETH <5 11/04/2015   ETH <5 09/18/2015    Metabolic Disorder Labs:  Lab Results  Component Value Date   HGBA1C 5.6 11/08/2015   MPG 114 11/08/2015   MPG 111 09/13/2015   Lab Results  Component Value Date   PROLACTIN 110.3* 11/08/2015   PROLACTIN 30.8* 09/13/2015   Lab Results  Component Value Date   CHOL 136 11/08/2015   TRIG 86 11/08/2015   HDL 36* 11/08/2015   CHOLHDL 3.8 11/08/2015   VLDL 17 11/08/2015   LDLCALC 83 11/08/2015   LDLCALC 84 09/14/2015    See Psychiatric Specialty Exam and Suicide Risk Assessment completed by Attending Physician prior to discharge.  Discharge destination:  Home  Is patient on multiple antipsychotic therapies at discharge:  No   Has Patient had three or more failed trials of antipsychotic monotherapy by history:  No  Recommended Plan for Multiple Antipsychotic Therapies: NA     Medication List    TAKE these medications      Indication   benztropine 0.5 MG tablet  Commonly known as:  COGENTIN  Take 1 tablet (0.5 mg total) by mouth 2 (two) times daily.   Indication:  Extrapyramidal Reaction caused by Medications     BuPROPion HCl ER (XL) 450 MG Tb24  Take 450 mg by mouth daily.   Indication:  Major Depressive Disorder  haloperidol 10 MG tablet  Commonly known as:  HALDOL  Take 1 tablet (10 mg total) by mouth at bedtime.   Indication:  Psychosis     haloperidol 5 MG tablet  Commonly known as:  HALDOL  Take 1 tablet (5 mg total) by mouth daily with breakfast.   Indication:  Psychosis     hydrOXYzine 50 MG tablet  Commonly known as:  ATARAX/VISTARIL  Take 1 tablet (50 mg total) by mouth 3 (three) times daily.   Indication:  Anxiety Neurosis, Anxiety     traZODone 100 MG tablet  Commonly known as:  DESYREL  Take 1 tablet (100 mg total) by mouth at bedtime.   Indication:  Trouble Sleeping           Follow-up Information    [facility] to [facility]   Specialty:  Behavioral Health   Why:  Walk-In Clinic is  open Monday-Friday 8AM-3PM.  There are several assessments and you will be there a long time.  Take refreshments.   [address] [place] [address] [phone]      Follow-up recommendations:  Activity:  as tol Diet:  as tol  Comments:  1.  Take all your medications as prescribed.   2.  Report any adverse side effects to outpatient provider. 3.  Patient instructed to not use alcohol or illegal drugs while on prescription medicines. 4.  In the event of worsening symptoms, instructed patient to call 911, the crisis hotline or go to nearest emergency room for evaluation of symptoms.  Signed: Lindwood Qua, NP [facility] 11/11/2015, 2:39 PM

## 2015-11-11 NOTE — Progress Notes (Signed)
Pt d/c from the hospital. All items returned. D/C instructions given, prescriptions given and samples given. Pt denies si and hi. 

## 2016-12-30 ENCOUNTER — Inpatient Hospital Stay (HOSPITAL_COMMUNITY)
Admission: EM | Admit: 2016-12-30 | Discharge: 2017-01-09 | DRG: 464 | Disposition: A | Discharge status: Home / Self Care | Payer: Self-pay | Attending: Vascular Surgery | Admitting: Vascular Surgery

## 2016-12-30 ENCOUNTER — Emergency Department (HOSPITAL_COMMUNITY): Payer: Self-pay

## 2016-12-30 ENCOUNTER — Encounter (HOSPITAL_COMMUNITY): Payer: Self-pay | Admitting: Emergency Medicine

## 2016-12-30 DIAGNOSIS — D72829 Elevated white blood cell count, unspecified: Secondary | ICD-10-CM | POA: Diagnosis present

## 2016-12-30 DIAGNOSIS — F909 Attention-deficit hyperactivity disorder, unspecified type: Secondary | ICD-10-CM | POA: Diagnosis present

## 2016-12-30 DIAGNOSIS — M79606 Pain in leg, unspecified: Secondary | ICD-10-CM

## 2016-12-30 DIAGNOSIS — F17210 Nicotine dependence, cigarettes, uncomplicated: Secondary | ICD-10-CM | POA: Diagnosis present

## 2016-12-30 DIAGNOSIS — T79A0XA Compartment syndrome, unspecified, initial encounter: Secondary | ICD-10-CM | POA: Diagnosis present

## 2016-12-30 DIAGNOSIS — M6282 Rhabdomyolysis: Secondary | ICD-10-CM | POA: Diagnosis present

## 2016-12-30 DIAGNOSIS — M79A11 Nontraumatic compartment syndrome of right upper extremity: Principal | ICD-10-CM | POA: Diagnosis present

## 2016-12-30 DIAGNOSIS — F319 Bipolar disorder, unspecified: Secondary | ICD-10-CM | POA: Diagnosis present

## 2016-12-30 DIAGNOSIS — Z79899 Other long term (current) drug therapy: Secondary | ICD-10-CM

## 2016-12-30 LAB — I-STAT CHEM 8, ED
BUN: 5 mg/dL — ABNORMAL LOW (ref 6–20)
CALCIUM ION: 1.14 mmol/L — AB (ref 1.15–1.40)
CHLORIDE: 101 mmol/L (ref 101–111)
CREATININE: 0.8 mg/dL (ref 0.61–1.24)
Glucose, Bld: 108 mg/dL — ABNORMAL HIGH (ref 65–99)
HCT: 49 % (ref 39.0–52.0)
Hemoglobin: 16.7 g/dL (ref 13.0–17.0)
Potassium: 4.3 mmol/L (ref 3.5–5.1)
SODIUM: 137 mmol/L (ref 135–145)
TCO2: 27 mmol/L (ref 0–100)

## 2016-12-30 LAB — I-STAT CG4 LACTIC ACID, ED: Lactic Acid, Venous: 1.97 mmol/L (ref 0.5–1.9)

## 2016-12-30 MED ORDER — HYDROMORPHONE HCL 2 MG/ML IJ SOLN
1.0000 mg | Freq: Once | INTRAMUSCULAR | Status: AC
  Administered 2016-12-30: 1 mg via INTRAVENOUS
  Filled 2016-12-30: qty 1

## 2016-12-30 NOTE — ED Notes (Addendum)
Pt presenting with swelling and 10/10 pain in the lower right leg and right foot numbness onset last night. CMS intact in the extremity.

## 2016-12-30 NOTE — ED Triage Notes (Signed)
Pt comes in with right leg pain that began last night.  States he had no injury.  Calf and ankle slightly swollen upon assessment.  Pt reports numbness in his right foot.  Tender to palpation.  Pulses intact.

## 2016-12-30 NOTE — ED Provider Notes (Signed)
[facility]-EMERGENCY DEPT Provider Note   CSN: [id number] Arrival date & time: 12/30/16  2204  By signing my name below, I, Phillips ClimesFabiola de Louis, attest that this documentation has been prepared under the direction and in the presence of Derwood KaplanNanavati, Anasophia Pecor, MD . Electronically Signed: Phillips ClimesFabiola de Louis, Scribe. 12/31/2016. 8:17 AM.  History   Chief Complaint Chief Complaint  Patient presents with  . Leg Pain  . Numbness    HPI Comments Todd Shaw is a 31 y.o. male with a PMHx of bipolar disorder, who presents to the Emergency Department with complaints of sudden onset generalized right leg pain and edema x1 day, since 2200 last night.  Sx worse and localized over the upper right proximal tibia.  No inciting event noted.  No recent falls, trauma. No hx of PE/DVT.   Sx have worsened since onset, currently rated a 10/10 in severity.  No relieving factors reported.  Associated numbness and tingling to right foot only.  No new medication use.  No back pain.  Pt is a current smoker of .5ppd.  Admits to occasional marijuana use, no cocaine.  No FMHx of MI, CAD.  Trouble sleeping and difficulty ambulating 2/2 discomfort.  Pt reports being completely asymptomatic yesterday before sx onset.  He denies any heavy lifting or strenuous exercise. He denies any trauma, insect bites. He denies worsening pain in his leg with exercises leading upto this event.  The history is provided by the patient and medical records. No language interpreter was used.    Past Medical History:  Diagnosis Date  . Bipolar 1 disorder (HCC)   . Hallucinations   . Psychosis     Patient Active Problem List   Diagnosis Date Noted  . Compartment syndrome (HCC) 12/31/2016  . Cannabis use disorder, moderate, dependence (HCC) 11/11/2015  . Major depressive disorder, recurrent episode, severe, with psychotic behavior (HCC) 09/21/2015  . Attention deficit hyperactivity disorder (ADHD) 09/13/2015  . Tobacco use disorder 09/13/2015    Past  Surgical History:  Procedure Laterality Date  . APPLICATION OF WOUND VAC Right 01/01/2017   Procedure: RIGHT LEG WOUND VAC CHANGE with irrigation and DEBRIDEMENT of right leg fasciotomy site;  Surgeon: Larina EarthlyEarly, Todd F, MD;  [facility];  Service: Vascular;  Laterality: Right;  . FASCIOTOMY Right 12/31/2016   Procedure: FASCIOTOMY, RIGHT LOWER EXTREMITY COMPARTMENT;  Surgeon: Maeola Harmanain, Brandon Christopher, MD;  [facility];  Service: Vascular;  Laterality: Right;       Home Medications    Prior to Admission medications   Medication Sig Start Date End Date Taking? Authorizing Provider  ARIPiprazole (ABILIFY) 10 MG tablet Take 10 mg by mouth daily.   Yes [provider]  benztropine (COGENTIN) 0.5 MG tablet Take 1 tablet (0.5 mg total) by mouth 2 (two) times daily. Patient not taking: Reported on 12/31/2016 11/11/15   Adonis BrookAgustin, Sheila, NP  buPROPion 450 MG TB24 Take 450 mg by mouth daily. Patient not taking: Reported on 12/31/2016 11/11/15   Adonis BrookAgustin, Sheila, NP  haloperidol (HALDOL) 10 MG tablet Take 1 tablet (10 mg total) by mouth at bedtime. Patient not taking: Reported on 12/31/2016 11/11/15   Adonis BrookAgustin, Sheila, NP  haloperidol (HALDOL) 5 MG tablet Take 1 tablet (5 mg total) by mouth daily with breakfast. Patient not taking: Reported on 12/31/2016 11/11/15   Adonis BrookAgustin, Sheila, NP  hydrOXYzine (ATARAX/VISTARIL) 50 MG tablet Take 1 tablet (50 mg total) by mouth 3 (three) times daily. Patient not taking: Reported on 12/31/2016 11/11/15   Adonis BrookAgustin, Sheila,  NP  traZODone (DESYREL) 100 MG tablet Take 1 tablet (100 mg total) by mouth at bedtime. Patient not taking: Reported on 12/31/2016 11/11/15   Adonis Brook, NP    Family History Family History  Problem Relation Age of Onset  . Mental illness Neg Hx     Social History Social History  Substance Use Topics  . Smoking status: Current Every Day Smoker    Packs/day: 0.00    Types: Cigarettes  . Smokeless tobacco: Never Used  . Alcohol use No      Allergies   Patient has no known allergies.   Review of Systems Review of Systems  Constitutional: Negative for chills and fever.  HENT: Negative for congestion and sore throat.   Respiratory: Negative for cough and shortness of breath.   Cardiovascular: Positive for leg swelling. Negative for chest pain.  Gastrointestinal: Negative for abdominal pain, constipation, diarrhea, nausea and vomiting.  Musculoskeletal: Positive for myalgias. Negative for back pain.  Neurological: Positive for weakness and numbness.  All other systems reviewed and are negative.    Physical Exam Updated Vital Signs BP 135/76 (BP Location: Left Arm)   Pulse 85   Temp 98.1 F (36.7 C) (Oral)   Resp 18   Ht 5\' 7"  (1.702 m)   Wt 99.8 kg (220 lb)   SpO2 96%   BMI 34.46 kg/m   Physical Exam  Constitutional: He is oriented to person, place, and time. He appears well-developed and well-nourished. He appears distressed.  HENT:  Head: Normocephalic and atraumatic.  Eyes: EOM are normal.  Neck: Normal range of motion.  Cardiovascular: Normal rate, regular rhythm, normal heart sounds and intact distal pulses.   Pulmonary/Chest: Effort normal and breath sounds normal. No respiratory distress.  Abdominal: Soft. There is no tenderness.  Musculoskeletal: Normal range of motion.  Tenderness and edema over the right lower extremity, right leg appears to be slightly warm compared to the left.  Right calf tenderness.  Pulse-less on palpation.  Doppler pulse noted. 2+ and equal femoral pulses bilaterally.The dorsum of the foot has subjective numbness and paresthesias on the right.  Rest of the right lower extremity has normal sensation.   Over the right proximal 1/3 of the tibia there is significant edema with tenderness to palpation.    Neurological: He is alert and oriented to person, place, and time.  Skin: Skin is warm and dry.  Psychiatric: He has a normal mood and affect.  Nursing note and vitals  reviewed.  ED Treatments / Results  DIAGNOSTIC STUDIES: Oxygen Saturation is 99% on room air, normal by my interpretation.    COORDINATION OF CARE: 11:17 PM Discussed treatment plan with pt at bedside and pt agreed to plan.  Labs (all labs ordered are listed, but only abnormal results are displayed) Labs Reviewed  CBC - Abnormal; Notable for the following:       Result Value   WBC 11.7 (*)    MCHC 36.6 (*)    All other components within normal limits  CK - Abnormal; Notable for the following:    Total CK 16,412 (*)    All other components within normal limits  COMPREHENSIVE METABOLIC PANEL - Abnormal; Notable for the following:    BUN <5 (*)    AST 370 (*)    ALT 82 (*)    All other components within normal limits  CK - Abnormal; Notable for the following:    Total CK 26,163 (*)    All other components within  normal limits  CK - Abnormal; Notable for the following:    Total CK 22,046 (*)    All other components within normal limits  CK - Abnormal; Notable for the following:    Total CK 20,168 (*)    All other components within normal limits  BASIC METABOLIC PANEL - Abnormal; Notable for the following:    Sodium 133 (*)    Glucose, Bld 130 (*)    Calcium 8.7 (*)    All other components within normal limits  CK - Abnormal; Notable for the following:    Total CK 25,191 (*)    All other components within normal limits  CK - Abnormal; Notable for the following:    Total CK 14,970 (*)    All other components within normal limits  BASIC METABOLIC PANEL - Abnormal; Notable for the following:    Glucose, Bld 118 (*)    BUN 5 (*)    Calcium 8.7 (*)    All other components within normal limits  CBC WITH DIFFERENTIAL/PLATELET - Abnormal; Notable for the following:    WBC 11.8 (*)    Neutro Abs 8.3 (*)    Monocytes Absolute 1.6 (*)    All other components within normal limits  CK - Abnormal; Notable for the following:    Total CK 10,723 (*)    All other components within  normal limits  CK - Abnormal; Notable for the following:    Total CK 11,310 (*)    All other components within normal limits  CK - Abnormal; Notable for the following:    Total CK 8,448 (*)    All other components within normal limits  CK - Abnormal; Notable for the following:    Total CK 6,355 (*)    All other components within normal limits  CK - Abnormal; Notable for the following:    Total CK 5,048 (*)    All other components within normal limits  CK - Abnormal; Notable for the following:    Total CK 4,287 (*)    All other components within normal limits  CK - Abnormal; Notable for the following:    Total CK 3,642 (*)    All other components within normal limits  CK - Abnormal; Notable for the following:    Total CK 3,130 (*)    All other components within normal limits  BASIC METABOLIC PANEL - Abnormal; Notable for the following:    Glucose, Bld 114 (*)    BUN <5 (*)    Calcium 8.7 (*)    All other components within normal limits  I-STAT CHEM 8, ED - Abnormal; Notable for the following:    BUN 5 (*)    Glucose, Bld 108 (*)    Calcium, Ion 1.14 (*)    All other components within normal limits  I-STAT CG4 LACTIC ACID, ED - Abnormal; Notable for the following:    Lactic Acid, Venous 1.97 (*)    All other components within normal limits  SURGICAL PCR SCREEN  HIV ANTIBODY (ROUTINE TESTING)  CBC  CBC    EKG  EKG Interpretation None       Radiology No results found.  Procedures Procedures (including critical care time)  ULTRASOUND LIMITED VASCULAR EVALUATION/  PERFUSION EVAL Indication: pulseless extremity Linear probe used to evaluate area of interest in two planes. Findings:  The color doppler do show arterial perfusion to the lower extremity - but it is weak compared to the contralateral side Performed by: Dr Rhunette Croft  Images saved electronically  CRITICAL CARE Performed by: Derwood Kaplan   Total critical care time: 85 minutes - multiple assessments,  multiple consults for compartment syndrome  Critical care time was exclusive of separately billable procedures and treating other patients.  Critical care was necessary to treat or prevent imminent or life-threatening deterioration.  Critical care was time spent personally by me on the following activities: development of treatment plan with patient and/or surrogate as well as nursing, discussions with consultants, evaluation of patient's response to treatment, examination of patient, obtaining history from patient or surrogate, ordering and performing treatments and interventions, ordering and review of laboratory studies, ordering and review of radiographic studies, pulse oximetry and re-evaluation of patient's condition.   Medications Ordered in ED Medications  iopamidol (ISOVUE-370) 76 % injection (not administered)  ARIPiprazole (ABILIFY) tablet 10 mg (10 mg Oral Given 01/02/17 1051)  benztropine (COGENTIN) tablet 0.5 mg (0.5 mg Oral Given 01/02/17 2231)  haloperidol (HALDOL) tablet 5 mg (5 mg Oral Not Given 01/02/17 0800)  traZODone (DESYREL) tablet 100 mg (100 mg Oral Given 01/02/17 2231)  hydrOXYzine (ATARAX/VISTARIL) tablet 50 mg (50 mg Oral Given 01/02/17 2122)  potassium chloride SA (K-DUR,KLOR-CON) CR tablet 20-40 mEq ( Oral MAR Unhold 01/01/17 1650)  ondansetron (ZOFRAN) injection 4 mg ( Intravenous MAR Unhold 01/01/17 1650)  alum & mag hydroxide-simeth (MAALOX/MYLANTA) 200-200-20 MG/5ML suspension 15-30 mL ( Oral MAR Unhold 01/01/17 1650)  pantoprazole (PROTONIX) EC tablet 40 mg (40 mg Oral Given 01/02/17 1051)  labetalol (NORMODYNE,TRANDATE) injection 10 mg ( Intravenous MAR Unhold 01/01/17 1650)  hydrALAZINE (APRESOLINE) injection 5 mg ( Intravenous MAR Unhold 01/01/17 1650)  metoprolol tartrate (LOPRESSOR) injection 2-5 mg ( Intravenous MAR Unhold 01/01/17 1650)  guaiFENesin-dextromethorphan (ROBITUSSIN DM) 100-10 MG/5ML syrup 15 mL ( Oral MAR Unhold 01/01/17 1650)  phenol (CHLORASEPTIC)  mouth spray 1 spray ( Mouth/Throat MAR Unhold 01/01/17 1650)  heparin injection 5,000 Units (5,000 Units Subcutaneous Given 01/03/17 0621)  docusate sodium (COLACE) capsule 100 mg (100 mg Oral Given 01/02/17 2122)  buPROPion (WELLBUTRIN XL) 24 hr tablet 450 mg (450 mg Oral Not Given 01/02/17 1000)  haloperidol (HALDOL) tablet 10 mg (10 mg Oral Not Given 01/02/17 2123)  diphenhydrAMINE (BENADRYL) capsule 25 mg ( Oral MAR Unhold 01/01/17 1650)  naloxone (NARCAN) injection 0.4 mg ( Intravenous MAR Unhold 01/01/17 1650)    And  sodium chloride flush (NS) 0.9 % injection 9 mL ( Intravenous MAR Unhold 01/01/17 1650)  morphine 2 mg/mL PCA injection ( Intravenous Received 01/03/17 0507)  HYDROmorphone (DILAUDID) injection 1 mg (1 mg Intravenous Given 12/30/16 2350)  iopamidol (ISOVUE-370) 76 % injection 100 mL (100 mLs Intravenous Contrast Given 12/31/16 0047)  sodium chloride 0.9 % bolus 1,000 mL (1,000 mLs Intravenous New Bag/Given 12/31/16 0331)  HYDROmorphone (DILAUDID) 1 MG/ML injection (  Duplicate 01/01/17 1630)     Initial Impression / Assessment and Plan / ED Course  I have reviewed the triage vital signs and the nursing notes.  Pertinent labs & imaging results that were available during my care of the patient were reviewed by me and considered in my medical decision making (see chart for details).  Clinical Course as of Jan 04 816  Sun Dec 31, 2016  0211 CT scan results show proximal tibial artery occlusion with faint reconstitution in the foot. On exam, there is swelling in the anterior 1/3rd of the tibia and there is no cold, clammy leg and pt has no medical hx or risk factors to lead to thrombosis - so  I suspect compartment syndrome is leading to the mechanical obstruction of the tibial artery. I spoke with Dr. Shon Baton, Orthopedics. We discussed the presentation, the lack of trauma/fracture and the CT findings along with my exam findings of proximal tibial swelling, elevated CK and suspicion for  compartment syndrome. Dr. Shon Baton informed me that without a fracture it is more likely that this is a primary vascular issue - like a vasculitis, and thus I need to call Vascular Surgery. I spoke with Dr. Randie Heinz, Vascular Surgery. I discussed the same presentation to Dr.Cain and Orthopedics discussion. He will come and see the patient now at [facility]. He didn't want the patient to be transferred to [facility], as he doesn't think this will be a primary vascular issue that will need surgical management. CT ANGIO LOW EXTREM RIGHT W &/OR WO CONTRAST [AN]  0216 Ortho paged again.  [AN]    Clinical Course User Index [AN] Derwood Kaplan, MD    Pt comes in with cc of leg pain, severel Pt is noted to have unilateral RLE swelling, mostly over the leg distal to the knee. Pt has no palpable pulse, but - he has warm skin and has subjective numbness to the dorsum of the foot. Pt has a robust femoral pulse.  DDX: Arterial insufficiency. Vasculitis Compartment syndrome Hematoma leading to arterial insufficiency. Thrombosis Vasospasm  US doppler confirms a pulse. CT angio ordered to figure out what the etiology is. Anticipate ortho vs. Vascular consult.   Final Clinical Impressions(s) / ED Diagnoses   Final diagnoses:  Leg pain  Non-traumatic compartment syndrome of right upper extremity  Non-traumatic rhabdomyolysis   New Prescriptions Current Discharge Medication List       Derwood Kaplan, MD 01/03/17 [phone]

## 2016-12-31 ENCOUNTER — Encounter (HOSPITAL_COMMUNITY)
Admission: EM | Disposition: A | Discharge status: Home / Self Care | Payer: Self-pay | Source: Home / Self Care | Attending: Vascular Surgery

## 2016-12-31 ENCOUNTER — Emergency Department (HOSPITAL_COMMUNITY): Payer: Self-pay

## 2016-12-31 ENCOUNTER — Emergency Department (HOSPITAL_COMMUNITY): Payer: Self-pay | Admitting: Anesthesiology

## 2016-12-31 ENCOUNTER — Encounter (HOSPITAL_COMMUNITY): Payer: Self-pay | Admitting: Radiology

## 2016-12-31 DIAGNOSIS — T79A0XA Compartment syndrome, unspecified, initial encounter: Secondary | ICD-10-CM | POA: Diagnosis present

## 2016-12-31 DIAGNOSIS — M79A21 Nontraumatic compartment syndrome of right lower extremity: Secondary | ICD-10-CM

## 2016-12-31 HISTORY — PX: FASCIOTOMY: SHX132

## 2016-12-31 LAB — CBC
HCT: 44.3 % (ref 39.0–52.0)
HEMATOCRIT: 48.8 % (ref 39.0–52.0)
HEMOGLOBIN: 16.2 g/dL (ref 13.0–17.0)
Hemoglobin: 16.9 g/dL (ref 13.0–17.0)
MCH: 31.5 pg (ref 26.0–34.0)
MCH: 31.2 pg (ref 26.0–34.0)
MCHC: 36.6 g/dL — AB (ref 30.0–36.0)
MCHC: 34.6 g/dL (ref 30.0–36.0)
MCV: 86 fL (ref 78.0–100.0)
MCV: 90.2 fL (ref 78.0–100.0)
PLATELETS: 267 10*3/uL (ref 150–400)
Platelets: 231 10*3/uL (ref 150–400)
RBC: 5.15 MIL/uL (ref 4.22–5.81)
RBC: 5.41 MIL/uL (ref 4.22–5.81)
RDW: 13.6 % (ref 11.5–15.5)
RDW: 13.9 % (ref 11.5–15.5)
WBC: 11.7 10*3/uL — ABNORMAL HIGH (ref 4.0–10.5)
WBC: 6.3 10*3/uL (ref 4.0–10.5)

## 2016-12-31 LAB — BASIC METABOLIC PANEL
Anion gap: 6 (ref 5–15)
BUN: 6 mg/dL (ref 6–20)
CHLORIDE: 103 mmol/L (ref 101–111)
CO2: 24 mmol/L (ref 22–32)
Calcium: 8.7 mg/dL — ABNORMAL LOW (ref 8.9–10.3)
Creatinine, Ser: 0.83 mg/dL (ref 0.61–1.24)
GFR calc Af Amer: >60 mL/min (ref >60)
GFR calc non Af Amer: >60 mL/min (ref >60)
GLUCOSE: 130 mg/dL — AB (ref 65–99)
POTASSIUM: 3.9 mmol/L (ref 3.5–5.1)
Sodium: 133 mmol/L — ABNORMAL LOW (ref 135–145)

## 2016-12-31 LAB — COMPREHENSIVE METABOLIC PANEL
ALT: 82 U/L — AB (ref 17–63)
AST: 370 U/L — AB (ref 15–41)
Albumin: 4.5 g/dL (ref 3.5–5.0)
Alkaline Phosphatase: 59 U/L (ref 38–126)
Anion gap: 9 (ref 5–15)
BILIRUBIN TOTAL: 0.7 mg/dL (ref 0.3–1.2)
CO2: 25 mmol/L (ref 22–32)
CREATININE: 1.07 mg/dL (ref 0.61–1.24)
Calcium: 9.2 mg/dL (ref 8.9–10.3)
Chloride: 105 mmol/L (ref 101–111)
GFR calc Af Amer: >60 mL/min (ref >60)
GLUCOSE: 99 mg/dL (ref 65–99)
POTASSIUM: 4.1 mmol/L (ref 3.5–5.1)
Sodium: 139 mmol/L (ref 135–145)
TOTAL PROTEIN: 7.5 g/dL (ref 6.5–8.1)

## 2016-12-31 LAB — CK
CK TOTAL: 22046 U/L — AB (ref 49–397)
Total CK: 16412 U/L — ABNORMAL HIGH (ref 49–397)
Total CK: 26163 U/L — ABNORMAL HIGH (ref 49–397)
Total CK: 25191 U/L — ABNORMAL HIGH (ref 49–397)

## 2016-12-31 LAB — HIV ANTIBODY (ROUTINE TESTING W REFLEX): HIV SCREEN 4TH GENERATION: NONREACTIVE

## 2016-12-31 SURGERY — FASCIOTOMY, UPPER EXTREMITY
Anesthesia: General | Laterality: Right

## 2016-12-31 MED ORDER — HYDROMORPHONE HCL 1 MG/ML IJ SOLN: 1.0000 mg | INTRAMUSCULAR | Status: DC | PRN

## 2016-12-31 MED ORDER — GUAIFENESIN-DM 100-10 MG/5ML PO SYRP: 15.0000 mL | ORAL_SOLUTION | ORAL | Status: DC | PRN

## 2016-12-31 MED ORDER — CEFAZOLIN SODIUM-DEXTROSE 2-3 GM-% IV SOLR
INTRAVENOUS | Status: DC | PRN
  Administered 2016-12-31: 2 g via INTRAVENOUS

## 2016-12-31 MED ORDER — HYDROXYZINE HCL 25 MG PO TABS
50.0000 mg | ORAL_TABLET | Freq: Three times a day (TID) | ORAL | Status: DC
  Administered 2016-12-31 – 2017-01-09 (×20): 50 mg via ORAL
  Filled 2016-12-31 (×21): qty 2

## 2016-12-31 MED ORDER — LIDOCAINE HCL (CARDIAC) 20 MG/ML IV SOLN
INTRAVENOUS | Status: DC | PRN
  Administered 2016-12-31: 60 mg via INTRATRACHEAL

## 2016-12-31 MED ORDER — POTASSIUM CHLORIDE CRYS ER 20 MEQ PO TBCR: 20.0000 meq | EXTENDED_RELEASE_TABLET | Freq: Once | ORAL | Status: DC

## 2016-12-31 MED ORDER — PHENOL 1.4 % MT LIQD: 1.0000 | OROMUCOSAL | Status: DC | PRN

## 2016-12-31 MED ORDER — PHENYLEPHRINE HCL 10 MG/ML IJ SOLN
INTRAMUSCULAR | Status: DC | PRN
  Administered 2016-12-31: 40 ug via INTRAVENOUS

## 2016-12-31 MED ORDER — LACTATED RINGERS IV BOLUS (SEPSIS): 1000.0000 mL | Freq: Once | INTRAVENOUS | Status: DC

## 2016-12-31 MED ORDER — IOPAMIDOL (ISOVUE-370) INJECTION 76%
INTRAVENOUS | Status: AC
  Filled 2016-12-31: qty 100

## 2016-12-31 MED ORDER — SUCCINYLCHOLINE CHLORIDE 20 MG/ML IJ SOLN
INTRAMUSCULAR | Status: DC | PRN
  Administered 2016-12-31: 120 mg via INTRAVENOUS

## 2016-12-31 MED ORDER — ALUM & MAG HYDROXIDE-SIMETH 200-200-20 MG/5ML PO SUSP
15.0000 mL | ORAL | Status: DC | PRN
Start: 2016-12-31 — End: 2017-01-09

## 2016-12-31 MED ORDER — TRAZODONE HCL 100 MG PO TABS
100.0000 mg | ORAL_TABLET | Freq: Every day | ORAL | Status: DC
  Administered 2016-12-31 – 2017-01-08 (×9): 100 mg via ORAL
  Filled 2016-12-31 (×9): qty 1

## 2016-12-31 MED ORDER — LABETALOL HCL 5 MG/ML IV SOLN: 10.0000 mg | INTRAVENOUS | Status: DC | PRN

## 2016-12-31 MED ORDER — ARIPIPRAZOLE 10 MG PO TABS
10.0000 mg | ORAL_TABLET | Freq: Every day | ORAL | Status: DC
  Administered 2017-01-01 – 2017-01-09 (×8): 10 mg via ORAL
  Filled 2016-12-31 (×12): qty 1

## 2016-12-31 MED ORDER — LACTATED RINGERS IV SOLN
INTRAVENOUS | Status: DC | PRN
  Administered 2016-12-31: 04:00:00 via INTRAVENOUS

## 2016-12-31 MED ORDER — HALOPERIDOL 5 MG PO TABS: 10.0000 mg | ORAL_TABLET | Freq: Every day | ORAL | Status: DC

## 2016-12-31 MED ORDER — FENTANYL CITRATE (PF) 250 MCG/5ML IJ SOLN
INTRAMUSCULAR | Status: DC | PRN
  Administered 2016-12-31: 100 ug via INTRAVENOUS

## 2016-12-31 MED ORDER — OXYCODONE-ACETAMINOPHEN 5-325 MG PO TABS
1.0000 | ORAL_TABLET | ORAL | Status: DC | PRN
  Administered 2016-12-31 (×3): 2 via ORAL
  Filled 2016-12-31 (×3): qty 2

## 2016-12-31 MED ORDER — BUPROPION HCL ER (XL) 450 MG PO TB24: 450.0000 mg | ORAL_TABLET | Freq: Every day | ORAL | Status: DC

## 2016-12-31 MED ORDER — METOPROLOL TARTRATE 5 MG/5ML IV SOLN: 2.0000 mg | INTRAVENOUS | Status: DC | PRN

## 2016-12-31 MED ORDER — IOPAMIDOL (ISOVUE-370) INJECTION 76%
100.0000 mL | Freq: Once | INTRAVENOUS | Status: AC | PRN
  Administered 2016-12-31: 100 mL via INTRAVENOUS

## 2016-12-31 MED ORDER — 0.9 % SODIUM CHLORIDE (POUR BTL) OPTIME
TOPICAL | Status: DC | PRN
  Administered 2016-12-31: 1000 mL

## 2016-12-31 MED ORDER — DIPHENHYDRAMINE HCL 25 MG PO CAPS
25.0000 mg | ORAL_CAPSULE | Freq: Four times a day (QID) | ORAL | Status: DC | PRN
  Administered 2016-12-31 – 2017-01-01 (×3): 25 mg via ORAL
  Filled 2016-12-31 (×3): qty 1

## 2016-12-31 MED ORDER — DOCUSATE SODIUM 100 MG PO CAPS
100.0000 mg | ORAL_CAPSULE | Freq: Two times a day (BID) | ORAL | Status: DC
  Administered 2016-12-31 – 2017-01-09 (×12): 100 mg via ORAL
  Filled 2016-12-31 (×16): qty 1

## 2016-12-31 MED ORDER — HYDRALAZINE HCL 20 MG/ML IJ SOLN: 5.0000 mg | INTRAMUSCULAR | Status: DC | PRN

## 2016-12-31 MED ORDER — BUPROPION HCL ER (XL) 300 MG PO TB24
450.0000 mg | ORAL_TABLET | Freq: Every day | ORAL | Status: DC
  Administered 2017-01-04 – 2017-01-09 (×6): 450 mg via ORAL
  Filled 2016-12-31 (×12): qty 1

## 2016-12-31 MED ORDER — HALOPERIDOL 5 MG PO TABS
10.0000 mg | ORAL_TABLET | Freq: Every day | ORAL | Status: DC
  Administered 2017-01-03 – 2017-01-08 (×6): 10 mg via ORAL
  Filled 2016-12-31 (×10): qty 2

## 2016-12-31 MED ORDER — ONDANSETRON HCL 4 MG/2ML IJ SOLN: 4.0000 mg | Freq: Four times a day (QID) | INTRAMUSCULAR | Status: DC | PRN

## 2016-12-31 MED ORDER — LACTATED RINGERS IV SOLN
INTRAVENOUS | Status: DC
  Administered 2016-12-31 – 2017-01-01 (×2): via INTRAVENOUS
  Administered 2017-01-01: 150 mL/h via INTRAVENOUS

## 2016-12-31 MED ORDER — BENZTROPINE MESYLATE 0.5 MG PO TABS
0.5000 mg | ORAL_TABLET | Freq: Two times a day (BID) | ORAL | Status: DC
  Administered 2017-01-02 – 2017-01-09 (×10): 0.5 mg via ORAL
  Filled 2016-12-31 (×20): qty 1

## 2016-12-31 MED ORDER — HYDROMORPHONE HCL 1 MG/ML IJ SOLN
0.5000 mg | INTRAMUSCULAR | Status: DC | PRN
  Administered 2016-12-31 – 2017-01-01 (×8): 1 mg via INTRAVENOUS
  Filled 2016-12-31 (×8): qty 1

## 2016-12-31 MED ORDER — PROPOFOL 10 MG/ML IV BOLUS
INTRAVENOUS | Status: DC | PRN
  Administered 2016-12-31: 200 mg via INTRAVENOUS

## 2016-12-31 MED ORDER — MIDAZOLAM HCL 5 MG/5ML IJ SOLN
INTRAMUSCULAR | Status: DC | PRN
  Administered 2016-12-31: 2 mg via INTRAVENOUS

## 2016-12-31 MED ORDER — HYDROMORPHONE HCL 1 MG/ML IJ SOLN
0.2500 mg | INTRAMUSCULAR | Status: DC | PRN
Start: 2016-12-31 — End: 2016-12-31

## 2016-12-31 MED ORDER — HEPARIN SODIUM (PORCINE) 5000 UNIT/ML IJ SOLN
5000.0000 [IU] | Freq: Three times a day (TID) | INTRAMUSCULAR | Status: DC
  Administered 2016-12-31 – 2017-01-09 (×25): 5000 [IU] via SUBCUTANEOUS
  Filled 2016-12-31 (×25): qty 1

## 2016-12-31 MED ORDER — PANTOPRAZOLE SODIUM 40 MG PO TBEC
40.0000 mg | DELAYED_RELEASE_TABLET | Freq: Every day | ORAL | Status: DC
  Administered 2016-12-31 – 2017-01-09 (×8): 40 mg via ORAL
  Filled 2016-12-31 (×7): qty 1

## 2016-12-31 MED ORDER — HALOPERIDOL 5 MG PO TABS
5.0000 mg | ORAL_TABLET | Freq: Every day | ORAL | Status: DC
  Administered 2017-01-06 – 2017-01-09 (×3): 5 mg via ORAL
  Filled 2016-12-31 (×11): qty 1

## 2016-12-31 MED ORDER — HYDROMORPHONE HCL 2 MG/ML IJ SOLN
1.0000 mg | INTRAMUSCULAR | Status: DC | PRN
  Administered 2016-12-31: 1 mg via INTRAVENOUS
  Filled 2016-12-31: qty 1

## 2016-12-31 MED ORDER — SODIUM CHLORIDE 0.9 % IV BOLUS (SEPSIS)
1000.0000 mL | Freq: Once | INTRAVENOUS | Status: AC
  Administered 2016-12-31: 1000 mL via INTRAVENOUS

## 2016-12-31 SURGICAL SUPPLY — 39 items
BANDAGE ACE 4X5 VEL STRL LF (GAUZE/BANDAGES/DRESSINGS) IMPLANT
BANDAGE ACE 6X5 VEL STRL LF (GAUZE/BANDAGES/DRESSINGS) IMPLANT
BNDG GAUZE ELAST 4 BULKY (GAUZE/BANDAGES/DRESSINGS) IMPLANT
CANISTER SUCT 3000ML PPV (MISCELLANEOUS) ×3 IMPLANT
COVER SURGICAL LIGHT HANDLE (MISCELLANEOUS) ×3 IMPLANT
DRAPE EXTREMITY T 121X128X90 (DRAPE) IMPLANT
DRAPE HALF SHEET 40X57 (DRAPES) IMPLANT
DRAPE INCISE IOBAN 66X45 STRL (DRAPES) ×3 IMPLANT
DRAPE ORTHO SPLIT 77X108 STRL (DRAPES) ×2
DRAPE SURG ORHT 6 SPLT 77X108 (DRAPES) ×1 IMPLANT
DRSG ADAPTIC 3X8 NADH LF (GAUZE/BANDAGES/DRESSINGS) IMPLANT
DRSG VAC ATS LRG SENSATRAC (GAUZE/BANDAGES/DRESSINGS) ×3 IMPLANT
ELECT REM PT RETURN 9FT ADLT (ELECTROSURGICAL) ×3
ELECTRODE REM PT RTRN 9FT ADLT (ELECTROSURGICAL) ×1 IMPLANT
GLOVE BIO SURGEON STRL SZ7.5 (GLOVE) ×3 IMPLANT
GLOVE BIOGEL PI IND STRL 7.0 (GLOVE) ×2 IMPLANT
GLOVE BIOGEL PI INDICATOR 7.0 (GLOVE) ×4
GLOVE SURG SS PI 6.5 STRL IVOR (GLOVE) ×3 IMPLANT
GLOVE SURG SS PI 7.0 STRL IVOR (GLOVE) ×3 IMPLANT
GOWN STRL REUS W/ TWL LRG LVL3 (GOWN DISPOSABLE) ×2 IMPLANT
GOWN STRL REUS W/ TWL XL LVL3 (GOWN DISPOSABLE) ×1 IMPLANT
GOWN STRL REUS W/TWL LRG LVL3 (GOWN DISPOSABLE) ×4
GOWN STRL REUS W/TWL XL LVL3 (GOWN DISPOSABLE) ×2
KIT BASIN OR (CUSTOM PROCEDURE TRAY) ×3 IMPLANT
KIT ROOM TURNOVER OR (KITS) ×3 IMPLANT
NS IRRIG 1000ML POUR BTL (IV SOLUTION) ×3 IMPLANT
PACK GENERAL/GYN (CUSTOM PROCEDURE TRAY) IMPLANT
PAD ARMBOARD 7.5X6 YLW CONV (MISCELLANEOUS) ×6 IMPLANT
PAD NEG PRESSURE SENSATRAC (MISCELLANEOUS) ×3 IMPLANT
SPONGE GAUZE 4X4 12PLY STER LF (GAUZE/BANDAGES/DRESSINGS) ×3 IMPLANT
SUT ETHILON 3 0 PS 1 (SUTURE) IMPLANT
SUT VIC AB 2-0 CT1 27 (SUTURE)
SUT VIC AB 2-0 CT1 TAPERPNT 27 (SUTURE) IMPLANT
SUT VIC AB 3-0 SH 27 (SUTURE)
SUT VIC AB 3-0 SH 27X BRD (SUTURE) IMPLANT
SUT VICRYL 4-0 PS2 18IN ABS (SUTURE) IMPLANT
TOWEL OR 17X24 6PK STRL BLUE (TOWEL DISPOSABLE) ×3 IMPLANT
TOWEL OR 17X26 10 PK STRL BLUE (TOWEL DISPOSABLE) ×3 IMPLANT
WATER STERILE IRR 1000ML POUR (IV SOLUTION) ×3 IMPLANT

## 2016-12-31 NOTE — ED Notes (Signed)
[facility] notified of need for transport to OR at [facility]-Dr. Randie Heinzain aware of delay of 45 minutes.

## 2016-12-31 NOTE — Anesthesia Postprocedure Evaluation (Signed)
Anesthesia Post Note  Patient: Todd Shaw  Procedure(s) Performed: Procedure(s) (LRB): FASCIOTOMY, RIGHT LOWER EXTREMITY COMPARTMENT (Right)     Patient location during evaluation: PACU Anesthesia Type: General Level of consciousness: awake and alert Pain management: pain level controlled Vital Signs Assessment: post-procedure vital signs reviewed and stable Respiratory status: spontaneous breathing, nonlabored ventilation, respiratory function stable and patient connected to nasal cannula oxygen Cardiovascular status: blood pressure returned to baseline and stable Postop Assessment: no signs of nausea or vomiting Anesthetic complications: no    Last Vitals:  Vitals:   12/31/16 0615 12/31/16 0630  BP:  135/70  Pulse:  (!) 101  Resp:  20  Temp: 37.1 C 37.2 C    Last Pain:  Vitals:   12/31/16 0630  TempSrc: Oral  PainSc:                  [name],W. EDMOND

## 2016-12-31 NOTE — Anesthesia Preprocedure Evaluation (Addendum)
Anesthesia Evaluation  Patient identified by MRN, date of birth, ID band Patient awake    Reviewed: Allergy & Precautions, H&P , NPO status , Patient's Chart, lab work & pertinent test results  Airway Mallampati: II  TM Distance: >3 FB Neck ROM: Full    Dental no notable dental hx. (+) Teeth Intact, Dental Advisory Given   Pulmonary Current Smoker,    Pulmonary exam normal breath sounds clear to auscultation       Cardiovascular negative cardio ROS   Rhythm:Regular Rate:Normal     Neuro/Psych PSYCHIATRIC DISORDERS Depression Bipolar Disorder Schizophrenia negative neurological ROS     GI/Hepatic negative GI ROS, Neg liver ROS,   Endo/Other  negative endocrine ROS  Renal/GU negative Renal ROS  negative genitourinary   Musculoskeletal   Abdominal   Peds  Hematology negative hematology ROS (+)   Anesthesia Other Findings   Reproductive/Obstetrics negative OB ROS                            Anesthesia Physical Anesthesia Plan  ASA: II and emergent  Anesthesia Plan: General   Post-op Pain Management:    Induction: Intravenous  PONV Risk Score and Plan: 2 and Ondansetron, Dexamethasone, Propofol and Midazolam  Airway Management Planned: Oral ETT and LMA  Additional Equipment:   Intra-op Plan:   Post-operative Plan: Extubation in OR  Informed Consent: I have reviewed the patients History and Physical, chart, labs and discussed the procedure including the risks, benefits and alternatives for the proposed anesthesia with the patient or authorized representative who has indicated his/her understanding and acceptance.   Dental advisory given  Plan Discussed with: CRNA, Anesthesiologist and Surgeon  Anesthesia Plan Comments:       Anesthesia Quick Evaluation

## 2016-12-31 NOTE — Op Note (Signed)
    Patient name: [place]ven [place] MRN: [id number] DOB: 1986-07-01 Sex: male  12/31/2016 Pre-operative Diagnosis: right anterior compartment syndrome Post-operative diagnosis:  Same Surgeon:  Apolinar JunesBrandon C. Randie Heinzain, MD Assistant: OR nurse Procedure Performed: Right lower extremity anterior and lateral compartment fasciotomy  Indications:  A 31 year old male with a one-day history of right anterior leg pain without inciting event. CT angina demonstrates likely compartment syndrome as does physical exam. His therefore indicated for fasciotomy.  Findings: The right anterior compartment was tight and muscle bulged immediately upon opening the fascia. Muscle had minimal reactivity to electrocautery although was grossly normal appearing. The lateral compartment was also opened muscle was viable and no bulging of the muscle was noted.   Procedure:  The patient was identified in the holding area and taken to the operating room where he was placed supine on the operating table and general endotracheal anesthesia was induced. He was sterilely prepped and draped in the right lower extremity in the usual fashion given antibiotics and timeout called. We began with vertical incision overlying the fibula from just below the fibular head to near the ankle. The skin and subcutaneous tissue was dissected with electrocautery and the fascia of the anterior compartment was opened. There was significant bulging of the muscle and most of it did not react to cautery. Then turned my attention to the lateral compartment which was minimally opened. The muscle was noted to be viable and reactive to cautery and there was no bulging of the muscle. Satisfied with this we irrigated obtained hemostasis and placed a wound VAC. Patient tolerated procedure well without immediate complication.  Blood loss 30 mL.   Brandon C. Randie Heinzain, MD [facility] [place] Office: [phone] Pager: [phone]

## 2016-12-31 NOTE — Anesthesia Procedure Notes (Signed)
Procedure Name: Intubation Date/Time: 12/31/2016 4:47 AM Performed by: Brien MatesMAHONY, Estell Puccini D Pre-anesthesia Checklist: Patient identified, Emergency Drugs available, Suction available, Patient being monitored and Timeout performed Patient Re-evaluated:Patient Re-evaluated prior to inductionOxygen Delivery Method: Circle system utilized Preoxygenation: Pre-oxygenation with 100% oxygen Intubation Type: IV induction Ventilation: Mask ventilation without difficulty Laryngoscope Size: Miller and 2 Grade View: Grade I Tube type: Oral Tube size: 7.5 mm Number of attempts: 1 Airway Equipment and Method: Stylet Placement Confirmation: ETT inserted through vocal cords under direct vision,  positive ETCO2 and breath sounds checked- equal and bilateral Secured at: 22 cm Tube secured with: Tape Dental Injury: Teeth and Oropharynx as per pre-operative assessment

## 2016-12-31 NOTE — Transfer of Care (Signed)
Immediate Anesthesia Transfer of Care Note  Patient: Todd Shaw  Procedure(s) Performed: Procedure(s): FASCIOTOMY, RIGHT LOWER EXTREMITY COMPARTMENT (Right)  Patient Location: PACU  Anesthesia Type:General  Level of Consciousness: sedated  Airway & Oxygen Therapy: Patient Spontanous Breathing and Patient connected to face mask oxygen  Post-op Assessment: Report given to RN and Post -op Vital signs reviewed and stable  Post vital signs: Reviewed and stable  Last Vitals:  Vitals:   12/31/16 0032 12/31/16 0252  BP: (!) 144/93 116/61  Pulse: 72 65  Resp: 18 18  Temp:  36.9 C    Last Pain:  Vitals:   12/31/16 0252  TempSrc: Oral  PainSc: 8          Complications: No apparent anesthesia complications

## 2016-12-31 NOTE — Progress Notes (Addendum)
  [facility] Progress Note  Subjective  - Day of Surgery  Having some itching of the right foot. Also numbness of right foot that is unchanged from pre-op.  Objective Vitals:   12/31/16 0615 12/31/16 0630  BP:  135/70  Pulse:  (!) 101  Resp:  20  Temp: 98.8 F (37.1 C) 98.9 F (37.2 C)    Intake/Output Summary (Last 24 hours) at 12/31/16 0838 Last data filed at 12/31/16 0535  Gross per 24 hour  Intake             1000 ml  Output               50 ml  Net              950 ml    Right leg with VAC laterally.  Palpable DP pulses bilaterally. Moving toes right foot. Diminished sensation to dorsum and plantar aspects right foot. Cannot move ankle secondary to pain.   Assessment/Planning: 31 y.o. male is s/p: right lower extremity anterior and lateral faciotomies Day of Surgery   Right foot well perfused. Moving right toes.  Pain is ok.  Continue VAC.    Raymond GurneyKimberly A Trinh 12/31/2016 8:38 AM --  Laboratory CBC    Component Value Date/Time   WBC 6.3 12/31/2016 0548   HGB 16.9 12/31/2016 0548   HCT 48.8 12/31/2016 0548   PLT 231 12/31/2016 0548    BMET    Component Value Date/Time   NA 139 12/31/2016 0548   K 4.1 12/31/2016 0548   CL 105 12/31/2016 0548   CO2 25 12/31/2016 0548   GLUCOSE 99 12/31/2016 0548   BUN <5 (L) 12/31/2016 0548   CREATININE 1.07 12/31/2016 0548   CALCIUM 9.2 12/31/2016 0548   GFRNONAA >60 12/31/2016 0548   GFRAA >60 12/31/2016 0548    COAG No results found for: INR, PROTIME No results found for: PTT  Antibiotics Anti-infectives    None       Maris BergerKimberly Trinh, PA-C [facility] Office: [phone] Pager: [phone] 12/31/2016 8:38 AM  I have independently interviewed patient and agree with PA assessment and plan above. CK elevated from last night and will trend. Anterior compartment muscles likely non-viable. Continue ivf hydration and recheck bmp this p.m.   Raedyn Klinck C. Randie Heinzain,  MD [facility] [place] Office: [phone] Pager: [phone]

## 2016-12-31 NOTE — H&P (Signed)
Hospital Consult    Reason for Consult:  Right anterior compartment syndrome Requesting Physician:  Rhunette Croft MRN #:  [id number]  History of Present Illness: This is a 32 y.o. male without significant medical history other than bipolar. On Friday evening began having pain in his right leg. He denies having any inciting incident. Had associated difficulty walking yesterday prompting ED visit. Denies any vascular history. Does smoke cigarettes daily. No blood thinners. Last meal was at 1700 on Saturday.  Past Medical History:  Diagnosis Date  . Bipolar 1 disorder (HCC)   . Hallucinations   . Psychosis     History reviewed. No pertinent surgical history.  No Known Allergies  Prior to Admission medications   Medication Sig Start Date End Date Taking? Authorizing Provider  ARIPiprazole (ABILIFY) 10 MG tablet Take 10 mg by mouth daily.   Yes [provider]  benztropine (COGENTIN) 0.5 MG tablet Take 1 tablet (0.5 mg total) by mouth 2 (two) times daily. Patient not taking: Reported on 12/31/2016 11/11/15   Adonis Brook, NP  buPROPion 450 MG TB24 Take 450 mg by mouth daily. Patient not taking: Reported on 12/31/2016 11/11/15   Adonis Brook, NP  haloperidol (HALDOL) 10 MG tablet Take 1 tablet (10 mg total) by mouth at bedtime. Patient not taking: Reported on 12/31/2016 11/11/15   Adonis Brook, NP  haloperidol (HALDOL) 5 MG tablet Take 1 tablet (5 mg total) by mouth daily with breakfast. Patient not taking: Reported on 12/31/2016 11/11/15   Adonis Brook, NP  hydrOXYzine (ATARAX/VISTARIL) 50 MG tablet Take 1 tablet (50 mg total) by mouth 3 (three) times daily. Patient not taking: Reported on 12/31/2016 11/11/15   Adonis Brook, NP  traZODone (DESYREL) 100 MG tablet Take 1 tablet (100 mg total) by mouth at bedtime. Patient not taking: Reported on 12/31/2016 11/11/15   Adonis Brook, NP    Social History   Social History  . Marital status: Single    Spouse name: N/A  . Number of  children: N/A  . Years of education: N/A   Occupational History  . Not on file.   Social History Main Topics  . Smoking status: Current Every Day Smoker    Packs/day: 0.00    Types: Cigarettes  . Smokeless tobacco: Never Used  . Alcohol use No  . Drug use: No  . Sexual activity: Not on file   Other Topics Concern  . Not on file   Social History Narrative  . No narrative on file    Family History  Problem Relation Age of Onset  . Mental illness Neg Hx     ROS: [x]  Positive   [ ]  Negative   [ ]  All sytems reviewed and are negative  Cardiovascular: []  chest pain/pressure []  palpitations []  SOB lying flat []  DOE [x]  pain in legs while walking [x]  pain in legs at rest []  pain in legs at night []  non-healing ulcers []  hx of DVT [x]  swelling in legs  Pulmonary: []  productive cough []  asthma/wheezing []  home O2  Neurologic: []  weakness in []  arms []  legs []  numbness in []  arms []  legs []  hx of CVA []  mini stroke [] difficulty speaking or slurred speech []  temporary loss of vision in one eye []  dizziness  Hematologic: []  hx of cancer []  bleeding problems []  problems with blood clotting easily  Endocrine:   []  diabetes []  thyroid disease  GI []  vomiting blood []  blood in stool  GU: []  CKD/renal failure []  HD--[]  M/W/F or []   T/T/S []  burning with urination []  blood in urine  Psychiatric: []  anxiety []  depression  Musculoskeletal: []  arthritis []  joint pain  Integumentary: []  rashes []  ulcers  Constitutional: []  fever []  chills   Physical Examination  Vitals:   12/31/16 0032 12/31/16 0252  BP: (!) 144/93 116/61  Pulse: 72 65  Resp: 18 18  Temp:  98.4 F (36.9 C)   Body mass index is 34.46 kg/m.  General:  WDWN in NAD Gait: Not observed HENT: WNL, normocephalic Pulmonary: normal non-labored breathing Cardiac: 2+ right popliteal and pt pulses, 1+ dp on right Abdomen: soft, NT/ND, no masses Extremities: Right anterior  compartment is tight with some associated erythema, lateral and posterior compartments are soft Musculoskeletal: no muscle wasting or atrophy  Neurologic: A&O X 3; Appropriate Affect ; he can minimally move his right foot, extreme pain on passive motion   CBC    Component Value Date/Time   WBC 11.7 (H) 12/30/2016 2315   RBC 5.15 12/30/2016 2315   HGB 16.7 12/30/2016 2346   HCT 49.0 12/30/2016 2346   PLT 267 12/30/2016 2315   MCV 86.0 12/30/2016 2315   MCH 31.5 12/30/2016 2315   MCHC 36.6 (H) 12/30/2016 2315   RDW 13.6 12/30/2016 2315   LYMPHSABS 1.6 11/04/2015 0004   MONOABS 0.8 11/04/2015 0004   EOSABS 0.1 11/04/2015 0004   BASOSABS 0.0 11/04/2015 0004    BMET    Component Value Date/Time   NA 137 12/30/2016 2346   K 4.3 12/30/2016 2346   CL 101 12/30/2016 2346   CO2 22 11/04/2015 0004   GLUCOSE 108 (H) 12/30/2016 2346   BUN 5 (L) 12/30/2016 2346   CREATININE 0.80 12/30/2016 2346   CALCIUM 9.5 11/04/2015 0004   GFRNONAA >60 11/04/2015 0004   GFRAA >60 11/04/2015 0004    COAGS: No results found for: INR, PROTIME   Non-Invasive Vascular Imaging:   IMPRESSION: 1. Occlusion of the anterior tibial artery 1.3 cm from the origin, with faint reconstitution distally at the ankle. Suggestion of loss of fatty striations in the anterior muscle compartment. Findings raise concern for compartment syndrome. 2. Short segment non-opacification of the peroneal artery at the ankle, with prompt reconstitution, may be in part technical. These results were called by telephone at the time of interpretation on 12/31/2016 at 1:54 am to Dr. Derwood KaplanANKIT NANAVATI , who verbally acknowledged these results.   ASSESSMENT/PLAN: This is a 31 y.o. male with compartment syndrome of right anterior compartment without underlying vascular disease or notable trauma. Will transfer to [facility] for urgent fasciotomy. Discussed risks and benefits and likely outcome of permanent disability in the right leg  with possibility of amputation. He understands and wants to proceed.   Atalaya Zappia C. Randie Heinzain, MD [facility] [place] Office: [phone] Pager: [phone]

## 2017-01-01 ENCOUNTER — Encounter (HOSPITAL_COMMUNITY)
Admission: EM | Disposition: A | Discharge status: Home / Self Care | Payer: Self-pay | Source: Home / Self Care | Attending: Vascular Surgery

## 2017-01-01 ENCOUNTER — Inpatient Hospital Stay (HOSPITAL_COMMUNITY): Payer: Self-pay | Admitting: Certified Registered Nurse Anesthetist

## 2017-01-01 ENCOUNTER — Encounter (HOSPITAL_COMMUNITY): Payer: Self-pay | Admitting: Vascular Surgery

## 2017-01-01 HISTORY — PX: APPLICATION OF WOUND VAC: SHX5189

## 2017-01-01 LAB — CBC WITH DIFFERENTIAL/PLATELET
BASOS ABS: 0 10*3/uL (ref 0.0–0.1)
Basophils Relative: 0 %
Eosinophils Absolute: 0 10*3/uL (ref 0.0–0.7)
Eosinophils Relative: 0 %
HEMATOCRIT: 43 % (ref 39.0–52.0)
HEMOGLOBIN: 14.4 g/dL (ref 13.0–17.0)
LYMPHS PCT: 16 %
Lymphs Abs: 1.9 10*3/uL (ref 0.7–4.0)
MCH: 30.3 pg (ref 26.0–34.0)
MCHC: 33.5 g/dL (ref 30.0–36.0)
MCV: 90.3 fL (ref 78.0–100.0)
Monocytes Absolute: 1.6 10*3/uL — ABNORMAL HIGH (ref 0.1–1.0)
Monocytes Relative: 14 %
NEUTROS ABS: 8.3 10*3/uL — AB (ref 1.7–7.7)
Neutrophils Relative %: 70 %
Platelets: 206 10*3/uL (ref 150–400)
RBC: 4.76 MIL/uL (ref 4.22–5.81)
RDW: 13.8 % (ref 11.5–15.5)
WBC: 11.8 10*3/uL — AB (ref 4.0–10.5)

## 2017-01-01 LAB — BASIC METABOLIC PANEL
Anion gap: 7 (ref 5–15)
BUN: 5 mg/dL — ABNORMAL LOW (ref 6–20)
CO2: 24 mmol/L (ref 22–32)
Calcium: 8.7 mg/dL — ABNORMAL LOW (ref 8.9–10.3)
Chloride: 104 mmol/L (ref 101–111)
Creatinine, Ser: 0.79 mg/dL (ref 0.61–1.24)
GFR calc Af Amer: >60 mL/min (ref >60)
GFR calc non Af Amer: >60 mL/min (ref >60)
Glucose, Bld: 118 mg/dL — ABNORMAL HIGH (ref 65–99)
Potassium: 4 mmol/L (ref 3.5–5.1)
Sodium: 135 mmol/L (ref 135–145)

## 2017-01-01 LAB — CK
CK TOTAL: 14970 U/L — AB (ref 49–397)
Total CK: 20168 U/L — ABNORMAL HIGH (ref 49–397)
Total CK: 10723 U/L — ABNORMAL HIGH (ref 49–397)
Total CK: 11310 U/L — ABNORMAL HIGH (ref 49–397)
Total CK: 8448 U/L — ABNORMAL HIGH (ref 49–397)

## 2017-01-01 LAB — SURGICAL PCR SCREEN
MRSA, PCR: NEGATIVE
Staphylococcus aureus: NEGATIVE

## 2017-01-01 SURGERY — APPLICATION, WOUND VAC
Anesthesia: General | Site: Leg Lower | Laterality: Right

## 2017-01-01 MED ORDER — HYDROMORPHONE HCL 1 MG/ML IJ SOLN
0.2500 mg | INTRAMUSCULAR | Status: DC | PRN
  Administered 2017-01-01: 0.5 mg via INTRAVENOUS

## 2017-01-01 MED ORDER — LIDOCAINE 2% (20 MG/ML) 5 ML SYRINGE
INTRAMUSCULAR | Status: DC | PRN
  Administered 2017-01-01: 60 mg via INTRAVENOUS

## 2017-01-01 MED ORDER — SUGAMMADEX SODIUM 200 MG/2ML IV SOLN
INTRAVENOUS | Status: AC
  Filled 2017-01-01: qty 2

## 2017-01-01 MED ORDER — FENTANYL CITRATE (PF) 250 MCG/5ML IJ SOLN
INTRAMUSCULAR | Status: AC
Start: 2017-01-01 — End: ?
  Filled 2017-01-01: qty 5

## 2017-01-01 MED ORDER — FENTANYL CITRATE (PF) 100 MCG/2ML IJ SOLN
INTRAMUSCULAR | Status: DC | PRN
  Administered 2017-01-01: 100 ug via INTRAVENOUS
  Administered 2017-01-01 (×2): 50 ug via INTRAVENOUS

## 2017-01-01 MED ORDER — CEFAZOLIN SODIUM 1 G IJ SOLR
INTRAMUSCULAR | Status: DC | PRN
  Administered 2017-01-01: 2 g via INTRAMUSCULAR

## 2017-01-01 MED ORDER — PROMETHAZINE HCL 25 MG/ML IJ SOLN: 6.2500 mg | INTRAMUSCULAR | Status: DC | PRN

## 2017-01-01 MED ORDER — NALOXONE HCL 0.4 MG/ML IJ SOLN: 0.4000 mg | INTRAMUSCULAR | Status: DC | PRN

## 2017-01-01 MED ORDER — SODIUM CHLORIDE 0.9 % IV SOLN
INTRAVENOUS | Status: DC
  Administered 2017-01-01: 20:00:00 via INTRAVENOUS

## 2017-01-01 MED ORDER — SUGAMMADEX SODIUM 200 MG/2ML IV SOLN
INTRAVENOUS | Status: DC | PRN
  Administered 2017-01-01: 200 mg via INTRAVENOUS

## 2017-01-01 MED ORDER — EPHEDRINE 5 MG/ML INJ
INTRAVENOUS | Status: AC
  Filled 2017-01-01: qty 10

## 2017-01-01 MED ORDER — MORPHINE SULFATE 2 MG/ML IV SOLN
INTRAVENOUS | Status: DC
  Administered 2017-01-01: 13.5 mg via INTRAVENOUS
  Administered 2017-01-01: 12:00:00 via INTRAVENOUS
  Administered 2017-01-01: 6 mg via INTRAVENOUS
  Administered 2017-01-02: 10.5 mg via INTRAVENOUS
  Administered 2017-01-02: 9 mg via INTRAVENOUS
  Administered 2017-01-02: 5.3 mg via INTRAVENOUS
  Administered 2017-01-02: 09:00:00 via INTRAVENOUS
  Administered 2017-01-02: 9 mg via INTRAVENOUS
  Administered 2017-01-03 (×3): via INTRAVENOUS
  Administered 2017-01-03: 18 mg via INTRAVENOUS
  Administered 2017-01-04: 10.5 mg via INTRAVENOUS
  Administered 2017-01-04: 12 mg via INTRAVENOUS
  Filled 2017-01-01 (×3): qty 25
  Filled 2017-01-01: qty 30
  Filled 2017-01-01: qty 25
  Filled 2017-01-01: qty 30
  Filled 2017-01-01: qty 25
  Filled 2017-01-01: qty 30

## 2017-01-01 MED ORDER — PHENYLEPHRINE 40 MCG/ML (10ML) SYRINGE FOR IV PUSH (FOR BLOOD PRESSURE SUPPORT)
PREFILLED_SYRINGE | INTRAVENOUS | Status: AC
  Filled 2017-01-01: qty 20

## 2017-01-01 MED ORDER — SODIUM CHLORIDE 0.9 % IJ SOLN
INTRAMUSCULAR | Status: AC
  Filled 2017-01-01: qty 10

## 2017-01-01 MED ORDER — MIDAZOLAM HCL 2 MG/2ML IJ SOLN
INTRAMUSCULAR | Status: AC
  Filled 2017-01-01: qty 2

## 2017-01-01 MED ORDER — PROPOFOL 10 MG/ML IV BOLUS
INTRAVENOUS | Status: DC | PRN
  Administered 2017-01-01: 20 mg via INTRAVENOUS
  Administered 2017-01-01: 200 mg via INTRAVENOUS

## 2017-01-01 MED ORDER — SODIUM CHLORIDE 0.9% FLUSH: 9.0000 mL | INTRAVENOUS | Status: DC | PRN

## 2017-01-01 MED ORDER — PHENYLEPHRINE 40 MCG/ML (10ML) SYRINGE FOR IV PUSH (FOR BLOOD PRESSURE SUPPORT)
PREFILLED_SYRINGE | INTRAVENOUS | Status: DC | PRN
  Administered 2017-01-01: 40 ug via INTRAVENOUS
  Administered 2017-01-01 (×2): 80 ug via INTRAVENOUS

## 2017-01-01 MED ORDER — PROPOFOL 10 MG/ML IV BOLUS
INTRAVENOUS | Status: AC
  Filled 2017-01-01: qty 20

## 2017-01-01 MED ORDER — ONDANSETRON HCL 4 MG/2ML IJ SOLN
INTRAMUSCULAR | Status: DC | PRN
  Administered 2017-01-01: 4 mg via INTRAVENOUS

## 2017-01-01 MED ORDER — LIDOCAINE 2% (20 MG/ML) 5 ML SYRINGE
INTRAMUSCULAR | Status: AC
  Filled 2017-01-01: qty 5

## 2017-01-01 MED ORDER — MIDAZOLAM HCL 2 MG/2ML IJ SOLN
INTRAMUSCULAR | Status: DC | PRN
  Administered 2017-01-01: 2 mg via INTRAVENOUS

## 2017-01-01 MED ORDER — ARTIFICIAL TEARS OPHTHALMIC OINT
TOPICAL_OINTMENT | OPHTHALMIC | Status: AC
  Filled 2017-01-01: qty 3.5

## 2017-01-01 MED ORDER — ONDANSETRON HCL 4 MG/2ML IJ SOLN
INTRAMUSCULAR | Status: AC
  Filled 2017-01-01: qty 2

## 2017-01-01 MED ORDER — DEXAMETHASONE SODIUM PHOSPHATE 10 MG/ML IJ SOLN
INTRAMUSCULAR | Status: AC
  Filled 2017-01-01: qty 1

## 2017-01-01 MED ORDER — ROCURONIUM BROMIDE 100 MG/10ML IV SOLN
INTRAVENOUS | Status: DC | PRN
  Administered 2017-01-01: 30 mg via INTRAVENOUS

## 2017-01-01 MED ORDER — CEFAZOLIN SODIUM 1 G IJ SOLR
INTRAMUSCULAR | Status: AC
  Filled 2017-01-01: qty 20

## 2017-01-01 MED ORDER — 0.9 % SODIUM CHLORIDE (POUR BTL) OPTIME
TOPICAL | Status: DC | PRN
  Administered 2017-01-01: 1000 mL

## 2017-01-01 MED ORDER — DEXAMETHASONE SODIUM PHOSPHATE 10 MG/ML IJ SOLN
INTRAMUSCULAR | Status: DC | PRN
  Administered 2017-01-01: 10 mg via INTRAVENOUS

## 2017-01-01 MED ORDER — PHENYLEPHRINE 40 MCG/ML (10ML) SYRINGE FOR IV PUSH (FOR BLOOD PRESSURE SUPPORT)
PREFILLED_SYRINGE | INTRAVENOUS | Status: AC
  Filled 2017-01-01: qty 10

## 2017-01-01 MED ORDER — HYDROMORPHONE HCL 1 MG/ML IJ SOLN
INTRAMUSCULAR | Status: AC
  Filled 2017-01-01: qty 0.5

## 2017-01-01 MED ORDER — LACTATED RINGERS IV SOLN
INTRAVENOUS | Status: DC
  Administered 2017-01-01 (×2): via INTRAVENOUS

## 2017-01-01 MED ORDER — ROCURONIUM BROMIDE 10 MG/ML (PF) SYRINGE
PREFILLED_SYRINGE | INTRAVENOUS | Status: AC
  Filled 2017-01-01: qty 5

## 2017-01-01 SURGICAL SUPPLY — 37 items
BANDAGE ACE 4X5 VEL STRL LF (GAUZE/BANDAGES/DRESSINGS) IMPLANT
BANDAGE ACE 6X5 VEL STRL LF (GAUZE/BANDAGES/DRESSINGS) IMPLANT
BNDG GAUZE ELAST 4 BULKY (GAUZE/BANDAGES/DRESSINGS) IMPLANT
CANISTER SUCT 3000ML PPV (MISCELLANEOUS) ×4 IMPLANT
COVER SURGICAL LIGHT HANDLE (MISCELLANEOUS) ×4 IMPLANT
DRAPE EXTREMITY T 121X128X90 (DRAPE) IMPLANT
DRAPE HALF SHEET 40X57 (DRAPES) IMPLANT
DRAPE INCISE IOBAN 66X45 STRL (DRAPES) IMPLANT
DRAPE ORTHO SPLIT 77X108 STRL (DRAPES)
DRAPE SURG ORHT 6 SPLT 77X108 (DRAPES) IMPLANT
DRSG ADAPTIC 3X8 NADH LF (GAUZE/BANDAGES/DRESSINGS) IMPLANT
DRSG VAC ATS LRG SENSATRAC (GAUZE/BANDAGES/DRESSINGS) ×4 IMPLANT
ELECT CAUTERY BLADE 6.4 (BLADE) ×4 IMPLANT
ELECT REM PT RETURN 9FT ADLT (ELECTROSURGICAL) ×4
ELECTRODE REM PT RTRN 9FT ADLT (ELECTROSURGICAL) ×2 IMPLANT
GLOVE BIO SURGEON STRL SZ7.5 (GLOVE) ×4 IMPLANT
GLOVE SURG SS PI 6.5 STRL IVOR (GLOVE) ×4 IMPLANT
GOWN STRL REUS W/ TWL LRG LVL3 (GOWN DISPOSABLE) ×4 IMPLANT
GOWN STRL REUS W/ TWL XL LVL3 (GOWN DISPOSABLE) ×2 IMPLANT
GOWN STRL REUS W/TWL LRG LVL3 (GOWN DISPOSABLE) ×4
GOWN STRL REUS W/TWL XL LVL3 (GOWN DISPOSABLE) ×2
KIT BASIN OR (CUSTOM PROCEDURE TRAY) ×4 IMPLANT
KIT ROOM TURNOVER OR (KITS) ×4 IMPLANT
NS IRRIG 1000ML POUR BTL (IV SOLUTION) ×4 IMPLANT
PACK GENERAL/GYN (CUSTOM PROCEDURE TRAY) IMPLANT
PAD ARMBOARD 7.5X6 YLW CONV (MISCELLANEOUS) ×8 IMPLANT
SPONGE GAUZE 4X4 12PLY STER LF (GAUZE/BANDAGES/DRESSINGS) ×4 IMPLANT
SUT ETHILON 3 0 PS 1 (SUTURE) IMPLANT
SUT VIC AB 2-0 CT1 27 (SUTURE)
SUT VIC AB 2-0 CT1 TAPERPNT 27 (SUTURE) IMPLANT
SUT VIC AB 3-0 SH 27 (SUTURE)
SUT VIC AB 3-0 SH 27X BRD (SUTURE) IMPLANT
SUT VICRYL 4-0 PS2 18IN ABS (SUTURE) IMPLANT
TOWEL OR 17X24 6PK STRL BLUE (TOWEL DISPOSABLE) ×4 IMPLANT
TOWEL OR 17X26 10 PK STRL BLUE (TOWEL DISPOSABLE) ×4 IMPLANT
WATER STERILE IRR 1000ML POUR (IV SOLUTION) ×4 IMPLANT
WND VAC CANISTER 500ML (MISCELLANEOUS) ×4 IMPLANT

## 2017-01-01 NOTE — Transfer of Care (Signed)
Immediate Anesthesia Transfer of Care Note  Patient: Todd Shaw  Procedure(s) Performed: Procedure(s): RIGHT LEG WOUND VAC CHANGE with irrigation and DEBRIDEMENT of right leg fasciotomy site (Right)  Patient Location: PACU  Anesthesia Type:General  Level of Consciousness: awake and alert   Airway & Oxygen Therapy: Patient Spontanous Breathing and Patient connected to nasal cannula oxygen  Post-op Assessment: Report given to RN, Post -op Vital signs reviewed and stable and Patient moving all extremities X 4  Post vital signs: Reviewed and stable  Last Vitals:  Vitals:   01/01/17 0407 01/01/17 1153  BP: 130/73   Pulse: (!) 123   Resp: (!) 22 18  Temp: 36.8 C     Last Pain:  Vitals:   01/01/17 1153  TempSrc:   PainSc: 10-Worst pain ever      Patients Stated Pain Goal: 0 (01/01/17 0207)  Complications: No apparent anesthesia complications

## 2017-01-01 NOTE — Anesthesia Preprocedure Evaluation (Signed)
Anesthesia Evaluation  Patient identified by MRN, date of birth, ID band Patient awake    Reviewed: Allergy & Precautions, H&P , NPO status , Patient's Chart, lab work & pertinent test results  Airway Mallampati: II  TM Distance: >3 FB Neck ROM: Full    Dental no notable dental hx. (+) Teeth Intact, Dental Advisory Given   Pulmonary Current Smoker,    Pulmonary exam normal breath sounds clear to auscultation       Cardiovascular negative cardio ROS   Rhythm:Regular Rate:Normal     Neuro/Psych PSYCHIATRIC DISORDERS Depression Bipolar Disorder Schizophrenia negative neurological ROS     GI/Hepatic negative GI ROS, Neg liver ROS,   Endo/Other  negative endocrine ROS  Renal/GU negative Renal ROS  negative genitourinary   Musculoskeletal   Abdominal   Peds  Hematology negative hematology ROS (+)   Anesthesia Other Findings   Reproductive/Obstetrics negative OB ROS                             Anesthesia Physical Anesthesia Plan  ASA: II  Anesthesia Plan: General   Post-op Pain Management:    Induction: Intravenous  PONV Risk Score and Plan: 2 and Ondansetron and Dexamethasone  Airway Management Planned:   Additional Equipment:   Intra-op Plan:   Post-operative Plan: Extubation in OR  Informed Consent: I have reviewed the patients History and Physical, chart, labs and discussed the procedure including the risks, benefits and alternatives for the proposed anesthesia with the patient or authorized representative who has indicated his/her understanding and acceptance.   Dental advisory given  Plan Discussed with:   Anesthesia Plan Comments:         Anesthesia Quick Evaluation

## 2017-01-01 NOTE — H&P (View-Only) (Signed)
  Progress Note    01/01/2017 7:13 AM 1 Day Post-Op  Subjective:  C/o pain and would like something more to help control pain.  Cannot move toes.  Afebrile HR  110's-150's ST 110's-130's systolic 97% RA  Vitals:   12/31/16 1948 01/01/17 0407  BP: 117/68 130/73  Pulse: (!) 108 (!) 123  Resp: (!) 22 (!) 22  Temp: 98.4 F (36.9 C) 98.3 F (36.8 C)    Physical Exam: Cardiac:  regular Lungs:  Non labored Incisions:  Wound vac with good seal Extremities:  Unable to move toes right foot; +palpable right DP pulse   CBC    Component Value Date/Time   WBC 11.8 (H) 01/01/2017 0353   RBC 4.76 01/01/2017 0353   HGB 14.4 01/01/2017 0353   HCT 43.0 01/01/2017 0353   PLT 206 01/01/2017 0353   MCV 90.3 01/01/2017 0353   MCH 30.3 01/01/2017 0353   MCHC 33.5 01/01/2017 0353   RDW 13.8 01/01/2017 0353   LYMPHSABS 1.9 01/01/2017 0353   MONOABS 1.6 (H) 01/01/2017 0353   EOSABS 0.0 01/01/2017 0353   BASOSABS 0.0 01/01/2017 0353    BMET    Component Value Date/Time   NA 135 01/01/2017 0353   K 4.0 01/01/2017 0353   CL 104 01/01/2017 0353   CO2 24 01/01/2017 0353   GLUCOSE 118 (H) 01/01/2017 0353   BUN 5 (L) 01/01/2017 0353   CREATININE 0.79 01/01/2017 0353   CALCIUM 8.7 (L) 01/01/2017 0353   GFRNONAA >60 01/01/2017 0353   GFRAA >60 01/01/2017 0353    INR No results found for: INR   Intake/Output Summary (Last 24 hours) at 01/01/17 0713 Last data filed at 01/01/17 [id number]  Gross per 24 hour  Intake             3225 ml  Output             3200 ml  Net               25 ml     Assessment:  31 y.o. male is s/p:  right lower extremity anterior and lateral faciotomies  1 Day Post-Op  Plan: -pt with palpable right DP pulse, however, he is unable to move his toes on the right.  His CK is down but still extremely elevated.  Afebrile but now with leukocytosis.  -most likely is going to need an amputation -pain is not controlled-will start PCA -will make pt npo in case  surgery later today -DVT prophylaxis:  SQ heparin   Doreatha MassedSamantha Jamarcus Laduke, PA-C [facility] [phone] 01/01/2017 7:13 AM  I have independently interviewed patient and agree with PA assessment and plan above. Take back to OR to further evaluate, possibly needs posterior fasciotomy although compartments are soft. Concern for necrotic muscle in anterior compartment. Decreased ivf given edema and stable renal function.   Brandon C. Randie Heinzain, MD [facility] [place] Office: [phone] Pager: [phone]

## 2017-01-01 NOTE — Anesthesia Procedure Notes (Signed)
Procedure Name: Intubation Date/Time: 01/01/2017 2:15 PM Performed by: Trixie Deis A Pre-anesthesia Checklist: Patient identified, Emergency Drugs available, Suction available and Patient being monitored Patient Re-evaluated:Patient Re-evaluated prior to inductionOxygen Delivery Method: Circle System Utilized Preoxygenation: Pre-oxygenation with 100% oxygen Intubation Type: IV induction Ventilation: Mask ventilation without difficulty Laryngoscope Size: 4 and Mac Grade View: Grade I Tube type: Oral Tube size: 7.5 mm Number of attempts: 1 Airway Equipment and Method: Stylet and Oral airway Placement Confirmation: ETT inserted through vocal cords under direct vision,  positive ETCO2 and breath sounds checked- equal and bilateral Secured at: 23 cm Tube secured with: Tape Dental Injury: Teeth and Oropharynx as per pre-operative assessment

## 2017-01-01 NOTE — Progress Notes (Addendum)
  Progress Note    01/01/2017 7:13 AM 1 Day Post-Op  Subjective:  C/o pain and would like something more to help control pain.  Cannot move toes.  Afebrile HR  110's-150's ST 110's-130's systolic 97% RA  Vitals:   12/31/16 1948 01/01/17 0407  BP: 117/68 130/73  Pulse: (!) 108 (!) 123  Resp: (!) 22 (!) 22  Temp: 98.4 F (36.9 C) 98.3 F (36.8 C)    Physical Exam: Cardiac:  regular Lungs:  Non labored Incisions:  Wound vac with good seal Extremities:  Unable to move toes right foot; +palpable right DP pulse   CBC    Component Value Date/Time   WBC 11.8 (H) 01/01/2017 0353   RBC 4.76 01/01/2017 0353   HGB 14.4 01/01/2017 0353   HCT 43.0 01/01/2017 0353   PLT 206 01/01/2017 0353   MCV 90.3 01/01/2017 0353   MCH 30.3 01/01/2017 0353   MCHC 33.5 01/01/2017 0353   RDW 13.8 01/01/2017 0353   LYMPHSABS 1.9 01/01/2017 0353   MONOABS 1.6 (H) 01/01/2017 0353   EOSABS 0.0 01/01/2017 0353   BASOSABS 0.0 01/01/2017 0353    BMET    Component Value Date/Time   NA 135 01/01/2017 0353   K 4.0 01/01/2017 0353   CL 104 01/01/2017 0353   CO2 24 01/01/2017 0353   GLUCOSE 118 (H) 01/01/2017 0353   BUN 5 (L) 01/01/2017 0353   CREATININE 0.79 01/01/2017 0353   CALCIUM 8.7 (L) 01/01/2017 0353   GFRNONAA >60 01/01/2017 0353   GFRAA >60 01/01/2017 0353    INR No results found for: INR   Intake/Output Summary (Last 24 hours) at 01/01/17 0713 Last data filed at 01/01/17 0632  Gross per 24 hour  Intake             3225 ml  Output             3200 ml  Net               25 ml     Assessment:  30 y.o. male is s/p:  right lower extremity anterior and lateral faciotomies  1 Day Post-Op  Plan: -pt with palpable right DP pulse, however, he is unable to move his toes on the right.  His CK is down but still extremely elevated.  Afebrile but now with leukocytosis.  -most likely is going to need an amputation -pain is not controlled-will start PCA -will make pt npo in case  surgery later today -DVT prophylaxis:  SQ heparin   Samantha Rhyne, PA-C [facility] [phone] 01/01/2017 7:13 AM  I have independently interviewed patient and agree with PA assessment and plan above. Take back to OR to further evaluate, possibly needs posterior fasciotomy although compartments are soft. Concern for necrotic muscle in anterior compartment. Decreased ivf given edema and stable renal function.   Hong Timm C. Mercades Bajaj, MD [facility] [place] Office: [phone] Pager: [phone]   

## 2017-01-01 NOTE — Anesthesia Postprocedure Evaluation (Signed)
Anesthesia Post Note  Patient: Iven Finnerry Benedetti  Procedure(s) Performed: Procedure(s) (LRB): RIGHT LEG WOUND VAC CHANGE with irrigation and DEBRIDEMENT of right leg fasciotomy site (Right)     Patient location during evaluation: PACU Anesthesia Type: General Level of consciousness: awake, sedated and oriented Pain management: pain level not controlled Vital Signs Assessment: post-procedure vital signs reviewed and stable Respiratory status: spontaneous breathing, nonlabored ventilation, respiratory function stable and patient connected to nasal cannula oxygen Cardiovascular status: blood pressure returned to baseline and stable Postop Assessment: no signs of nausea or vomiting Anesthetic complications: no    Last Vitals:  Vitals:   01/01/17 1630 01/01/17 1638  BP:    Pulse: 90   Resp: (!) 22   Temp:  36.6 C    Last Pain:  Vitals:   01/01/17 1617  TempSrc:   PainSc: 9                  Reynald Woods,JAMES TERRILL

## 2017-01-01 NOTE — Op Note (Signed)
    OPERATIVE REPORT  DATE OF SURGERY: 01/01/2017  PATIENT: Todd Shaw, 31 y.o. male MRN: [id number]  DOB: Dec 29, 1985  PRE-OPERATIVE DIAGNOSIS: Open fasciotomy anterior compartments right leg  POST-OPERATIVE DIAGNOSIS:  Same  PROCEDURE: Wound VAC change of open fasciotomy right leg anterior compartment  SURGEON:  Gretta Beganodd Jimya Ciani, M.D.  PHYSICIAN ASSISTANT: Nurse  ANESTHESIA:  Gen.  EBL: Minimal ml  Total I/O In: 600 [I.V.:600] Out: 1345 [Urine:1325; Blood:20]  BLOOD ADMINISTERED: None  DRAINS: None  SPECIMEN: None  COUNTS CORRECT:  YES  PLAN OF CARE: PACU   PATIENT DISPOSITION:  PACU - hemodynamically stable  PROCEDURE DETAILS: The patient was taken to the operative placed supine position the area of the right leg was prepped and draped in usual sterile fashion. The existing VAC had been removed before prepping. The muscle was all pink. The anterior most portion of the exposed muscle was pink but did not contract electrocautery. The posterior half of the anterior compartment did contract with electrocautery. All muscle appeared to be viable. The wounds were irrigated with saline and hemostasis tablet cautery. A VAC sponge was brought onto the field and was cut to appropriate dimension. The VAC seal was placed and I was a good vacuum and placed on suction. The patient was transferred to the recovery room in stable condition   [name], M.D., [facility] 01/01/2017 3:02 PM

## 2017-01-01 NOTE — Interval H&P Note (Deleted)
History and Physical Interval Note:  01/01/2017 1:58 PM  Todd Shaw  has presented today for surgery, with the diagnosis of c  The various methods of treatment have been discussed with the patient and family. After consideration of risks, benefits and other options for treatment, the patient has consented to  Procedure(s): FASCIOTOMY of left leg (Left) as a surgical intervention .  The patient's history has been reviewed, patient examined, no change in status, stable for surgery.  I have reviewed the patient's chart and labs.  Questions were answered to the patient's satisfaction.     Gretta BeganEarly, Todd

## 2017-01-01 NOTE — H&P (Signed)
For Right leg fasciotomy irrigation and debribement

## 2017-01-02 ENCOUNTER — Encounter (HOSPITAL_COMMUNITY): Payer: Self-pay | Admitting: Vascular Surgery

## 2017-01-02 LAB — CK
CK TOTAL: 3642 U/L — AB (ref 49–397)
Total CK: 6355 U/L — ABNORMAL HIGH (ref 49–397)
Total CK: 5048 U/L — ABNORMAL HIGH (ref 49–397)
Total CK: 4287 U/L — ABNORMAL HIGH (ref 49–397)

## 2017-01-02 NOTE — Progress Notes (Signed)
Orthopedic Tech Progress Note Patient Details:  Todd Shaw 1985-08-11 [id number]  Patient ID: Todd Shaw, male   DOB: 1985-08-11, 31 y.o.   MRN: [id number]   Saul FordyceJennifer C Janaisa Birkland 01/02/2017, 12:07 PMCalled [employer] for right Prafo boot.

## 2017-01-02 NOTE — Progress Notes (Addendum)
  Progress Note    01/02/2017 7:18 AM 1 Day Post-Op  Subjective:  Says his pain is a little bit better today  Afebrile HR  90's-110's NSR 120's-130's systolic 95% RA  Vitals:   01/02/17 0014 01/02/17 0402  BP: 134/68 125/73  Pulse: (!) 109 93  Resp: 20 19  Temp: 98.2 F (36.8 C) 98.6 F (37 C)    Physical Exam: Cardiac:  regular Lungs:  Non labored Incisions:   Wound vac in place lateral RLE with good seal Extremities:  +palpable right DP pulse; able to wiggle toes a little today; sensation questionable.    CBC    Component Value Date/Time   WBC 11.8 (H) 01/01/2017 0353   RBC 4.76 01/01/2017 0353   HGB 14.4 01/01/2017 0353   HCT 43.0 01/01/2017 0353   PLT 206 01/01/2017 0353   MCV 90.3 01/01/2017 0353   MCH 30.3 01/01/2017 0353   MCHC 33.5 01/01/2017 0353   RDW 13.8 01/01/2017 0353   LYMPHSABS 1.9 01/01/2017 0353   MONOABS 1.6 (H) 01/01/2017 0353   EOSABS 0.0 01/01/2017 0353   BASOSABS 0.0 01/01/2017 0353    BMET    Component Value Date/Time   NA 135 01/01/2017 0353   K 4.0 01/01/2017 0353   CL 104 01/01/2017 0353   CO2 24 01/01/2017 0353   GLUCOSE 118 (H) 01/01/2017 0353   BUN 5 (L) 01/01/2017 0353   CREATININE 0.79 01/01/2017 0353   CALCIUM 8.7 (L) 01/01/2017 0353   GFRNONAA >60 01/01/2017 0353   GFRAA >60 01/01/2017 0353    INR No results found for: INR   Intake/Output Summary (Last 24 hours) at 01/02/17 0718 Last data filed at 01/02/17 0602  Gross per 24 hour  Intake             3545 ml  Output             4395 ml  Net             -850 ml     Assessment:  31 y.o. male is s/p:  Right lower extremity anterior and lateral compartment fasciotomy and Wound VAC change of open fasciotomy right leg anterior compartment  1 Day Post-Op   Plan: -pt able to wiggle toes a little bit this morning and pain is a little better today that the past couple of days.  -CK improved to 6K -continue PCA and wound vac for now -DVT prophylaxis:  SQ  heparin   Doreatha MassedSamantha Rhyne, PA-C [facility] [phone] 01/02/2017 7:18 AM   I have independently interviewed patient and agree with PA assessment and plan above. afo ordered. Plan for OR washout, wound vac change tomorrow.   Zeya Balles C. Randie Heinzain, MD [facility] [place] Office: [phone] Pager: [phone]

## 2017-01-02 NOTE — Care Management Note (Signed)
Case Management Note Donn PieriniKristi Declyn Offield RN, BSN Unit 2W-Case Manager [phone]  Patient Details  Name: Todd Shaw MRN: [id number] Date of Birth: 06-Jan-1986  Subjective/Objective:  Pt admitted with compartment syndrome s/p ant./post. Fasciotomy with wound VAC placment                  Action/Plan:  PTA pt lived at home- referral received for PCP needs- CM will follow for this and possible HH needs for ?wound VAC at home- plan for return to OR on 6/27 for further washout and wound VAC change- [name]   Expected Discharge Date:                  Expected Discharge Plan:  Home w Home Health Services  In-House Referral:     Discharge planning Services  CM Consult, [facility]igent [facility]  Post Acute Care Choice:    Choice offered to:     DME Arranged:    DME Agency:     HH Arranged:    HH Agency:     Status of Service:  In process, will continue to follow  If discussed at Long Length of Stay Meetings, dates discussed:    Discharge Disposition:   Additional Comments:  Darrold SpanWebster, Orelia Brandstetter Hall, RN 01/02/2017, 2:58 PM

## 2017-01-03 ENCOUNTER — Inpatient Hospital Stay (HOSPITAL_COMMUNITY): Payer: Self-pay | Admitting: Anesthesiology

## 2017-01-03 ENCOUNTER — Encounter (HOSPITAL_COMMUNITY)
Admission: EM | Disposition: A | Discharge status: Home / Self Care | Payer: Self-pay | Source: Home / Self Care | Attending: Vascular Surgery

## 2017-01-03 ENCOUNTER — Encounter (HOSPITAL_COMMUNITY): Payer: Self-pay | Admitting: Certified Registered"

## 2017-01-03 DIAGNOSIS — M79A21 Nontraumatic compartment syndrome of right lower extremity: Secondary | ICD-10-CM

## 2017-01-03 HISTORY — PX: APPLICATION OF WOUND VAC: SHX5189

## 2017-01-03 HISTORY — PX: I&D EXTREMITY: SHX5045

## 2017-01-03 LAB — BASIC METABOLIC PANEL
Anion gap: 6 (ref 5–15)
CHLORIDE: 102 mmol/L (ref 101–111)
CO2: 27 mmol/L (ref 22–32)
CREATININE: 0.81 mg/dL (ref 0.61–1.24)
Calcium: 8.7 mg/dL — ABNORMAL LOW (ref 8.9–10.3)
GFR calc Af Amer: >60 mL/min (ref >60)
GFR calc non Af Amer: >60 mL/min (ref >60)
GLUCOSE: 114 mg/dL — AB (ref 65–99)
POTASSIUM: 3.5 mmol/L (ref 3.5–5.1)
Sodium: 135 mmol/L (ref 135–145)

## 2017-01-03 LAB — CBC
HEMATOCRIT: 42.3 % (ref 39.0–52.0)
Hemoglobin: 13.9 g/dL (ref 13.0–17.0)
MCH: 30.4 pg (ref 26.0–34.0)
MCHC: 32.9 g/dL (ref 30.0–36.0)
MCV: 92.6 fL (ref 78.0–100.0)
PLATELETS: 259 10*3/uL (ref 150–400)
RBC: 4.57 MIL/uL (ref 4.22–5.81)
RDW: 13.8 % (ref 11.5–15.5)
WBC: 9.1 10*3/uL (ref 4.0–10.5)

## 2017-01-03 LAB — CK: Total CK: 3130 U/L — ABNORMAL HIGH (ref 49–397)

## 2017-01-03 SURGERY — IRRIGATION AND DEBRIDEMENT EXTREMITY
Anesthesia: General | Site: Leg Lower | Laterality: Right

## 2017-01-03 MED ORDER — HYDROMORPHONE HCL 1 MG/ML IJ SOLN: 0.2500 mg | INTRAMUSCULAR | Status: DC | PRN

## 2017-01-03 MED ORDER — PROPOFOL 10 MG/ML IV BOLUS
INTRAVENOUS | Status: DC | PRN
  Administered 2017-01-03: 250 mg via INTRAVENOUS
  Administered 2017-01-03: 50 mg via INTRAVENOUS

## 2017-01-03 MED ORDER — 0.9 % SODIUM CHLORIDE (POUR BTL) OPTIME
TOPICAL | Status: DC | PRN
  Administered 2017-01-03: 3000 mL

## 2017-01-03 MED ORDER — ONDANSETRON HCL 4 MG/2ML IJ SOLN
INTRAMUSCULAR | Status: DC | PRN
  Administered 2017-01-03: 4 mg via INTRAVENOUS

## 2017-01-03 MED ORDER — FENTANYL CITRATE (PF) 100 MCG/2ML IJ SOLN
INTRAMUSCULAR | Status: DC | PRN
  Administered 2017-01-03: 25 ug via INTRAVENOUS
  Administered 2017-01-03 (×2): 50 ug via INTRAVENOUS

## 2017-01-03 MED ORDER — PROPOFOL 10 MG/ML IV BOLUS
INTRAVENOUS | Status: AC
  Filled 2017-01-03: qty 20

## 2017-01-03 MED ORDER — MIDAZOLAM HCL 5 MG/5ML IJ SOLN
INTRAMUSCULAR | Status: DC | PRN
  Administered 2017-01-03: 2 mg via INTRAVENOUS

## 2017-01-03 MED ORDER — ONDANSETRON HCL 4 MG/2ML IJ SOLN
INTRAMUSCULAR | Status: AC
  Filled 2017-01-03: qty 2

## 2017-01-03 MED ORDER — LIDOCAINE 2% (20 MG/ML) 5 ML SYRINGE
INTRAMUSCULAR | Status: AC
  Filled 2017-01-03: qty 5

## 2017-01-03 MED ORDER — ROCURONIUM BROMIDE 10 MG/ML (PF) SYRINGE
PREFILLED_SYRINGE | INTRAVENOUS | Status: AC
  Filled 2017-01-03: qty 5

## 2017-01-03 MED ORDER — GLYCOPYRROLATE 0.2 MG/ML IJ SOLN
INTRAMUSCULAR | Status: DC | PRN
  Administered 2017-01-03: 0.2 mg via INTRAVENOUS

## 2017-01-03 MED ORDER — OXYCODONE HCL 5 MG/5ML PO SOLN: 5.0000 mg | Freq: Once | ORAL | Status: DC | PRN

## 2017-01-03 MED ORDER — LACTATED RINGERS IV SOLN
INTRAVENOUS | Status: DC
  Administered 2017-01-03: 09:00:00 via INTRAVENOUS

## 2017-01-03 MED ORDER — PROMETHAZINE HCL 25 MG/ML IJ SOLN: 6.2500 mg | INTRAMUSCULAR | Status: DC | PRN

## 2017-01-03 MED ORDER — OXYCODONE HCL 5 MG PO TABS: 5.0000 mg | ORAL_TABLET | Freq: Once | ORAL | Status: DC | PRN

## 2017-01-03 MED ORDER — LIDOCAINE 2% (20 MG/ML) 5 ML SYRINGE
INTRAMUSCULAR | Status: DC | PRN
Start: 2017-01-03 — End: 2017-01-03
  Administered 2017-01-03: 60 mg via INTRAVENOUS

## 2017-01-03 MED ORDER — FENTANYL CITRATE (PF) 250 MCG/5ML IJ SOLN
INTRAMUSCULAR | Status: AC
  Filled 2017-01-03: qty 5

## 2017-01-03 MED ORDER — MIDAZOLAM HCL 2 MG/2ML IJ SOLN
INTRAMUSCULAR | Status: AC
  Filled 2017-01-03: qty 2

## 2017-01-03 MED ORDER — EPHEDRINE 5 MG/ML INJ
INTRAVENOUS | Status: AC
  Filled 2017-01-03: qty 10

## 2017-01-03 MED ORDER — PHENYLEPHRINE 40 MCG/ML (10ML) SYRINGE FOR IV PUSH (FOR BLOOD PRESSURE SUPPORT)
PREFILLED_SYRINGE | INTRAVENOUS | Status: AC
  Filled 2017-01-03: qty 10

## 2017-01-03 MED ORDER — SUCCINYLCHOLINE CHLORIDE 200 MG/10ML IV SOSY
PREFILLED_SYRINGE | INTRAVENOUS | Status: AC
  Filled 2017-01-03: qty 10

## 2017-01-03 MED ORDER — DEXAMETHASONE SODIUM PHOSPHATE 10 MG/ML IJ SOLN
INTRAMUSCULAR | Status: AC
  Filled 2017-01-03: qty 1

## 2017-01-03 MED ORDER — DEXAMETHASONE SODIUM PHOSPHATE 10 MG/ML IJ SOLN
INTRAMUSCULAR | Status: DC | PRN
  Administered 2017-01-03: 8 mg via INTRAVENOUS

## 2017-01-03 SURGICAL SUPPLY — 40 items
BAG ISOLATION DRAPE 18X18 (DRAPES) ×1 IMPLANT
BANDAGE ACE 4X5 VEL STRL LF (GAUZE/BANDAGES/DRESSINGS) IMPLANT
BANDAGE ACE 6X5 VEL STRL LF (GAUZE/BANDAGES/DRESSINGS) IMPLANT
BNDG GAUZE ELAST 4 BULKY (GAUZE/BANDAGES/DRESSINGS) IMPLANT
CANISTER SUCT 3000ML PPV (MISCELLANEOUS) ×4 IMPLANT
COVER SURGICAL LIGHT HANDLE (MISCELLANEOUS) ×2 IMPLANT
DRAPE ISOLATION BAG 18X18 (DRAPES) ×1
DRSG TEGADERM 4X4.75 (GAUZE/BANDAGES/DRESSINGS) ×6 IMPLANT
DRSG VAC ATS LRG SENSATRAC (GAUZE/BANDAGES/DRESSINGS) ×2 IMPLANT
DRSG VAC ATS MED SENSATRAC (GAUZE/BANDAGES/DRESSINGS) ×2 IMPLANT
DRSG VAC ATS SM SENSATRAC (GAUZE/BANDAGES/DRESSINGS) ×2 IMPLANT
ELECT REM PT RETURN 9FT ADLT (ELECTROSURGICAL) ×2
ELECTRODE REM PT RTRN 9FT ADLT (ELECTROSURGICAL) ×1 IMPLANT
GAUZE SPONGE 4X4 12PLY STRL (GAUZE/BANDAGES/DRESSINGS) ×2 IMPLANT
GLOVE BIO SURGEON STRL SZ7.5 (GLOVE) ×2 IMPLANT
GLOVE BIOGEL PI IND STRL 7.5 (GLOVE) ×1 IMPLANT
GLOVE BIOGEL PI INDICATOR 7.5 (GLOVE) ×1
GOWN STRL REUS W/ TWL LRG LVL3 (GOWN DISPOSABLE) ×3 IMPLANT
GOWN STRL REUS W/TWL LRG LVL3 (GOWN DISPOSABLE) ×3
HANDPIECE INTERPULSE COAX TIP (DISPOSABLE)
IV NS IRRIG 3000ML ARTHROMATIC (IV SOLUTION) IMPLANT
KIT BASIN OR (CUSTOM PROCEDURE TRAY) IMPLANT
KIT ROOM TURNOVER OR (KITS) ×2 IMPLANT
MANIFOLD NEPTUNE II (INSTRUMENTS) IMPLANT
NS IRRIG 1000ML POUR BTL (IV SOLUTION) ×6 IMPLANT
PACK GENERAL/GYN (CUSTOM PROCEDURE TRAY) ×2 IMPLANT
PACK UNIVERSAL I (CUSTOM PROCEDURE TRAY) ×2 IMPLANT
PAD ARMBOARD 7.5X6 YLW CONV (MISCELLANEOUS) ×4 IMPLANT
PAD NEG PRESSURE SENSATRAC (MISCELLANEOUS) ×2 IMPLANT
SET HNDPC FAN SPRY TIP SCT (DISPOSABLE) IMPLANT
STAPLER VISISTAT 35W (STAPLE) ×2 IMPLANT
SUT ETHILON 3 0 PS 1 (SUTURE) IMPLANT
SUT VIC AB 2-0 CTX 36 (SUTURE) IMPLANT
SUT VIC AB 3-0 SH 27 (SUTURE)
SUT VIC AB 3-0 SH 27X BRD (SUTURE) IMPLANT
SUT VICRYL 4-0 PS2 18IN ABS (SUTURE) IMPLANT
SYR BULB IRRIGATION 50ML (SYRINGE) ×2 IMPLANT
TAPE STRIPS DRAPE STRL (GAUZE/BANDAGES/DRESSINGS) ×2 IMPLANT
WATER STERILE IRR 1000ML POUR (IV SOLUTION) ×2 IMPLANT
WND VAC CANISTER 500ML (MISCELLANEOUS) ×2 IMPLANT

## 2017-01-03 NOTE — H&P (View-Only) (Signed)
  Progress Note    01/03/2017 8:41 AM 2 Days Post-Op  Subjective:  Having right leg pain  Vitals:   01/03/17 0400 01/03/17 0407  BP:  135/76  Pulse:  85  Resp: 13 18  Temp:  98.1 F (36.7 C)    Physical Exam: [id number] Right leg vac to suction, edema improving Posterior rle compartments are soft Palpable right dp/pt  CBC    Component Value Date/Time   WBC 9.1 01/03/2017 0345   RBC 4.57 01/03/2017 0345   HGB 13.9 01/03/2017 0345   HCT 42.3 01/03/2017 0345   PLT 259 01/03/2017 0345   MCV 92.6 01/03/2017 0345   MCH 30.4 01/03/2017 0345   MCHC 32.9 01/03/2017 0345   RDW 13.8 01/03/2017 0345   LYMPHSABS 1.9 01/01/2017 0353   MONOABS 1.6 (H) 01/01/2017 0353   EOSABS 0.0 01/01/2017 0353   BASOSABS 0.0 01/01/2017 0353    BMET    Component Value Date/Time   NA 135 01/03/2017 0345   K 3.5 01/03/2017 0345   CL 102 01/03/2017 0345   CO2 27 01/03/2017 0345   GLUCOSE 114 (H) 01/03/2017 0345   BUN <5 (L) 01/03/2017 0345   CREATININE 0.81 01/03/2017 0345   CALCIUM 8.7 (L) 01/03/2017 0345   GFRNONAA >60 01/03/2017 0345   GFRAA >60 01/03/2017 0345    INR No results found for: INR   Intake/Output Summary (Last 24 hours) at 01/03/17 0841 Last data filed at 01/03/17 0559  Gross per 24 hour  Intake              987 ml  Output             2675 ml  Net            -1688 ml     Assessment:  31 y.o. male is s/p rle fasciotomy for spontaneous anterior compartment syndrome. Ck's have normalized.   Plan: OR today for wound vac change and evaluation of muscle, possible debridement Minimize ivf  Iviona Hole C. Aulani Shipton, MD [facility] [place] Office: [phone] Pager: [phone]  01/03/2017 8:41 AM  

## 2017-01-03 NOTE — Op Note (Addendum)
Procedure: I and D of right leg fasciotomy with placement of VAC dressing  Preoperative diagnosis: Open wound right leg  Postoperative diagnosis: Same  Anesthesia Gen.  Assistant: Nurse  And occasions: Patient is a 31 year old male who had right leg fasciotomy performed several days ago for compartment syndrome. He returns today to the operating room for I and D of the fasciotomy  Operative details: After obtaining informed consent, the patient was taken to the operating room. The patient was placed in supine position on operating table. After induction of general anesthesia the patient's right lower extremity was prepped and draped in the usual sterile fashion. The lateral fasciotomy wounds was inspected. The muscle was pink.  The anterior tibialis muscle had no contractility.  The lateral muscles did contract. I then proceeded to irrigate the wound thoroughly with 3 liters of normal saline solution. The proximal and distal skin edges were then reapproximated with staples about 2 cm from each end. The patient tolerated procedure well and there were no complications. Instrument sponge and needle counts were correct at the end of the case. A VAC dressing was then placed over the wound.  The patient was taken to the recovery room in stable condition.  Fabienne Brunsharles Hayly Litsey, MD [facility] [place] Office: [phone] Pager: [phone]

## 2017-01-03 NOTE — Progress Notes (Signed)
Witness waste of 2mL of morphine PCA by Dutch Grayeresa M RN into sink.   Leonidas Rombergaitlin S Bumbledare, RN

## 2017-01-03 NOTE — Interval H&P Note (Signed)
History and Physical Interval Note:  01/03/2017 10:09 AM  Todd Shaw  has presented today for surgery, with the diagnosis of Open Fasciotomy right lower extremity  The various methods of treatment have been discussed with the patient and family. After consideration of risks, benefits and other options for treatment, the patient has consented to  Procedure(s): RIGHT LOWER EXTREMITY FASCIOTOMY WASH-OUT; POSSIBLE DEBRIDEMENT (Right) WOUND VAC CHANGE (Right) as a surgical intervention .  The patient's history has been reviewed, patient examined, no change in status, stable for surgery.  I have reviewed the patient's chart and labs.  Questions were answered to the patient's satisfaction.     Fabienne BrunsFields, Seaton Hofmann

## 2017-01-03 NOTE — Transfer of Care (Signed)
Immediate Anesthesia Transfer of Care Note  Patient: Todd Shaw  Procedure(s) Performed: Procedure(s): RIGHT LOWER EXTREMITY FASCIOTOMY WASH-OUT. DEBRIDEMENT (Right) WOUND VAC CHANGE (Right)  Patient Location: PACU  Anesthesia Type:General  Level of Consciousness: drowsy  Airway & Oxygen Therapy: Patient Spontanous Breathing and Patient connected to face mask oxygen  Post-op Assessment: Report given to RN and Post -op Vital signs reviewed and stable  Post vital signs: Reviewed and stable  Last Vitals:  Vitals:   01/03/17 0407 01/03/17 1150  BP: 135/76   Pulse: 85   Resp: 18   Temp: 36.7 C (P) 36.8 C    Last Pain:  Vitals:   01/03/17 1150  TempSrc:   PainSc: (P) Asleep      Patients Stated Pain Goal: 7 (01/02/17 2358)  Complications: No apparent anesthesia complications

## 2017-01-03 NOTE — Anesthesia Preprocedure Evaluation (Addendum)
Anesthesia Evaluation  Patient identified by MRN, date of birth, ID band Patient awake    Reviewed: Allergy & Precautions, H&P , NPO status , Patient's Chart, lab work & pertinent test results  Airway Mallampati: IV  TM Distance: >3 FB Neck ROM: Full   Comment: Beard  Dental  (+) Teeth Intact, Dental Advisory Given   Pulmonary Current Smoker,    Pulmonary exam normal breath sounds clear to auscultation       Cardiovascular negative cardio ROS   Rhythm:Regular Rate:Normal  ECG: SR, rate 73   Neuro/Psych PSYCHIATRIC DISORDERS Depression Bipolar Disorder Schizophrenia negative neurological ROS     GI/Hepatic negative GI ROS, Neg liver ROS,   Endo/Other  negative endocrine ROS  Renal/GU negative Renal ROS  negative genitourinary   Musculoskeletal   Abdominal (+) + obese,   Peds  Hematology negative hematology ROS (+)   Anesthesia Other Findings Obese   Reproductive/Obstetrics negative OB ROS                            Anesthesia Physical  Anesthesia Plan  ASA: II  Anesthesia Plan: General   Post-op Pain Management:    Induction: Intravenous  PONV Risk Score and Plan: 2 and Ondansetron and Dexamethasone  Airway Management Planned: LMA  Additional Equipment:   Intra-op Plan:   Post-operative Plan:   Informed Consent: I have reviewed the patients History and Physical, chart, labs and discussed the procedure including the risks, benefits and alternatives for the proposed anesthesia with the patient or authorized representative who has indicated his/her understanding and acceptance.   Dental advisory given  Plan Discussed with:   Anesthesia Plan Comments:         Anesthesia Quick Evaluation

## 2017-01-03 NOTE — Anesthesia Postprocedure Evaluation (Signed)
Anesthesia Post Note  Patient: Todd Shaw  Procedure(s) Performed: Procedure(s) (LRB): RIGHT LOWER EXTREMITY FASCIOTOMY WASH-OUT. DEBRIDEMENT (Right) WOUND VAC CHANGE (Right)     Patient location during evaluation: PACU Anesthesia Type: General Level of consciousness: awake and alert Pain management: pain level controlled Vital Signs Assessment: post-procedure vital signs reviewed and stable Respiratory status: spontaneous breathing, nonlabored ventilation, respiratory function stable and patient connected to nasal cannula oxygen Cardiovascular status: blood pressure returned to baseline and stable Postop Assessment: no signs of nausea or vomiting Anesthetic complications: no    Last Vitals:  Vitals:   01/03/17 1355 01/03/17 1400  BP:    Pulse:    Resp: (!) 21 18  Temp:      Last Pain:  Vitals:   01/03/17 1400  TempSrc:   PainSc: 6                  Ryan P Ellender

## 2017-01-03 NOTE — Anesthesia Procedure Notes (Addendum)
Procedure Name: LMA Insertion Date/Time: 01/03/2017 10:51 AM Performed by: Julian ReilWELTY, Sabrea Sankey F Pre-anesthesia Checklist: Patient identified, Emergency Drugs available, Suction available, Patient being monitored and Timeout performed Patient Re-evaluated:Patient Re-evaluated prior to inductionOxygen Delivery Method: Circle system utilized Preoxygenation: Pre-oxygenation with 100% oxygen Intubation Type: IV induction LMA: LMA inserted LMA Size: 5.0 Number of attempts: 1 Placement Confirmation: breath sounds checked- equal and bilateral and positive ETCO2 Tube secured with: Tape Dental Injury: Teeth and Oropharynx as per pre-operative assessment  Comments: LMA 5 placed without difficulty, +EtCO2 present.

## 2017-01-03 NOTE — Progress Notes (Signed)
  Progress Note    01/03/2017 8:41 AM 2 Days Post-Op  Subjective:  Having right leg pain  Vitals:   01/03/17 0400 01/03/17 0407  BP:  135/76  Pulse:  85  Resp: 13 18  Temp:  98.1 F (36.7 C)    Physical Exam: aaox3 Right leg vac to suction, edema improving Posterior rle compartments are soft Palpable right dp/pt  CBC    Component Value Date/Time   WBC 9.1 01/03/2017 0345   RBC 4.57 01/03/2017 0345   HGB 13.9 01/03/2017 0345   HCT 42.3 01/03/2017 0345   PLT 259 01/03/2017 0345   MCV 92.6 01/03/2017 0345   MCH 30.4 01/03/2017 0345   MCHC 32.9 01/03/2017 0345   RDW 13.8 01/03/2017 0345   LYMPHSABS 1.9 01/01/2017 0353   MONOABS 1.6 (H) 01/01/2017 0353   EOSABS 0.0 01/01/2017 0353   BASOSABS 0.0 01/01/2017 0353    BMET    Component Value Date/Time   NA 135 01/03/2017 0345   K 3.5 01/03/2017 0345   CL 102 01/03/2017 0345   CO2 27 01/03/2017 0345   GLUCOSE 114 (H) 01/03/2017 0345   BUN <5 (L) 01/03/2017 0345   CREATININE 0.81 01/03/2017 0345   CALCIUM 8.7 (L) 01/03/2017 0345   GFRNONAA >60 01/03/2017 0345   GFRAA >60 01/03/2017 0345    INR No results found for: INR   Intake/Output Summary (Last 24 hours) at 01/03/17 0841 Last data filed at 01/03/17 0559  Gross per 24 hour  Intake              987 ml  Output             2675 ml  Net            -1688 ml     Assessment:  31 y.o. male is s/p rle fasciotomy for spontaneous anterior compartment syndrome. Ck's have normalized.   Plan: OR today for wound vac change and evaluation of muscle, possible debridement Minimize ivf  Brandon C. Randie Heinzain, MD [facility] [place] Office: [phone] Pager: [phone]  01/03/2017 8:41 AM

## 2017-01-04 ENCOUNTER — Encounter (HOSPITAL_COMMUNITY): Payer: Self-pay | Admitting: Vascular Surgery

## 2017-01-04 MED ORDER — MORPHINE SULFATE (PF) 2 MG/ML IV SOLN
2.0000 mg | INTRAVENOUS | Status: DC | PRN
  Administered 2017-01-05 – 2017-01-08 (×7): 2 mg via INTRAVENOUS
  Filled 2017-01-04 (×7): qty 1

## 2017-01-04 MED ORDER — OXYCODONE-ACETAMINOPHEN 5-325 MG PO TABS
1.0000 | ORAL_TABLET | ORAL | Status: DC | PRN
  Administered 2017-01-04 – 2017-01-09 (×24): 2 via ORAL
  Filled 2017-01-04 (×23): qty 2

## 2017-01-04 NOTE — Progress Notes (Signed)
Wasted 4.5 mL of morphine in sink. Witnessed by Lawrence SantiagoNego Crosson, RN.

## 2017-01-04 NOTE — Progress Notes (Addendum)
  Progress Note    01/04/2017 7:13 AM 1 Day Post-Op  Subjective:  Feeling better.  Pain is better.  Wants to go outside for a little while with his uncle.  Afebrile HR 110's  120's-130's systolic 98% RA  Vitals:   01/04/17 0443 01/04/17 0556  BP: 136/70   Pulse: (!) 113   Resp: 18 18  Temp: 98.4 F (36.9 C)     Physical Exam: Lungs:  Non labored Incisions:  Wound vac with good seal Extremities:  Able to wiggle toes a little more today   CBC    Component Value Date/Time   WBC 9.1 01/03/2017 0345   RBC 4.57 01/03/2017 0345   HGB 13.9 01/03/2017 0345   HCT 42.3 01/03/2017 0345   PLT 259 01/03/2017 0345   MCV 92.6 01/03/2017 0345   MCH 30.4 01/03/2017 0345   MCHC 32.9 01/03/2017 0345   RDW 13.8 01/03/2017 0345   LYMPHSABS 1.9 01/01/2017 0353   MONOABS 1.6 (H) 01/01/2017 0353   EOSABS 0.0 01/01/2017 0353   BASOSABS 0.0 01/01/2017 0353    BMET    Component Value Date/Time   NA 135 01/03/2017 0345   K 3.5 01/03/2017 0345   CL 102 01/03/2017 0345   CO2 27 01/03/2017 0345   GLUCOSE 114 (H) 01/03/2017 0345   BUN <5 (L) 01/03/2017 0345   CREATININE 0.81 01/03/2017 0345   CALCIUM 8.7 (L) 01/03/2017 0345   GFRNONAA >60 01/03/2017 0345   GFRAA >60 01/03/2017 0345    INR No results found for: INR   Intake/Output Summary (Last 24 hours) at 01/04/17 0713 Last data filed at 01/04/17 [id number]  Gross per 24 hour  Intake             1180 ml  Output             3250 ml  Net            -2070 ml     Assessment:  31 y.o. male is s/p:  rle fasciotomy for spontaneous anterior compartment syndrome and I and D of right leg fasciotomy with placement of VAC dressing yesterday 1 Day Post-Op  Plan: -will d/c PCA today and change to oral pain meds-pt may go outside. -will plan to change wound vac at bedside tomorrow -will order PT/OT eval and treat. -CK still elevated but continues to decrease (3.1K) -renal function remains normal -DVT prophylaxis:  Heparin  SQ   Doreatha MassedSamantha Rhyne, PA-C [facility] [phone] 01/04/2017 7:13 AM   I have independently interviewed patient and agree with PA assessment and plan above. Bedside wound vac tomorrow. Pain much better controlled.   Knowledge Escandon C. Randie Heinzain, MD [facility] [place] Office: [phone] Pager: [phone]

## 2017-01-04 NOTE — Evaluation (Signed)
Physical Therapy Evaluation Patient Details Name: Todd Shaw MRN: [id number] DOB: 13-Aug-1985 Today's Date: 01/04/2017   History of Present Illness  Pt is a 31 yo male admitted through ED on 12/30/16 with extreme right leg pain. 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 Pt underwent an fasciotomy with wound vac placement on 01/01/17 and fasciotomy wash-out with wound vac change on 01/03/17. PMH significant for Bipolar 1 with hallucinations and psychosis, smoker.   Clinical Impression  Pt presents with the above diagnosis and below deficits for therapy evaluation. Prior to admission, pt was completely independent living with his mother and step-father in a multilevel home with his main living environment on the third floor. Pt will need to climb 6 steps to get into the house and a flight of stairs to get up to his room. Pt requires Min guard A for mobility this session and performs short distance gait with RW. Pt would like to perform gait with crutches prior to discharge as he feels more comfortable with this AD. Pt will benefit from continued acute PT follow-up in order to address the below deficits prior to discharge to venue recommended below.     Follow Up Recommendations No PT follow up    Equipment Recommendations  Crutches    Recommendations for Other Services       Precautions / Restrictions Precautions Precautions: Other (comment) Precaution Comments: wound vac  Required Braces or Orthoses: Other Brace/Splint Other Brace/Splint: Prafo shoe RLE Restrictions Weight Bearing Restrictions: Yes RLE Weight Bearing: Weight bearing as tolerated      Mobility  Bed Mobility Overal bed mobility: Independent;Needs Assistance Bed Mobility: Supine to Sit;Sit to Supine     Supine to sit: Supervision Sit to supine: Supervision   General bed mobility comments: Assistance to move covers from RLE, able to get EOB  without any assistance.   Transfers Overall transfer level: Needs assistance Equipment used: Rolling walker (2 wheeled) Transfers: Sit to/from Stand Sit to Stand: Min guard         General transfer comment: Min guard for safety  Ambulation/Gait Ambulation/Gait assistance: Min guard Ambulation Distance (Feet): 20 Feet Assistive device: Rolling walker (2 wheeled) Gait Pattern/deviations: Step-to pattern;Decreased step length - left;Decreased stance time - right;Antalgic Gait velocity: decreased Gait velocity interpretation: Below normal speed for age/gender General Gait Details: Moderate antalgic gait with pt maintain NWB on RLE. Pt attempted to weight bear and reports increased pain.   Stairs            Wheelchair Mobility    Modified Rankin (Stroke Patients Only)       Balance Overall balance assessment: Needs assistance Sitting-balance support: No upper extremity supported;Feet supported Sitting balance-Leahy Scale: Normal     Standing balance support: Bilateral upper extremity supported;During functional activity Standing balance-Leahy Scale: Poor Standing balance comment: reliant on UE support in standing                             Pertinent Vitals/Pain Pain Assessment: 0-10 Pain Score: 8  Pain Location: right LE Pain Descriptors / Indicators: Aching;Grimacing;Pins and needles;Sharp Pain Intervention(s): Monitored during session;Repositioned;Patient requesting pain meds-RN notified    Home Living Family/patient expects to be discharged to:: Private residence Living Arrangements: Other relatives;Other (Comment) (step father and mother) Available Help at Discharge: Family;Available PRN/intermittently Type of Home: House Home Access: Stairs to enter Entrance Stairs-Rails: Right;Left;Can reach both Entrance Stairs-Number of  Steps: 6 Home Layout: Multi-level Home Equipment: None      Prior Function Level of Independence: Independent          Comments: completely independent and attempting to work      Hand Dominance   Dominant Hand: Right    Extremity/Trunk Assessment   Upper Extremity Assessment Upper Extremity Assessment: Overall WFL for tasks assessed    Lower Extremity Assessment Lower Extremity Assessment: RLE deficits/detail RLE Deficits / Details: At least 3/5 RLE with wound vac in place    Cervical / Trunk Assessment Cervical / Trunk Assessment: Normal  Communication   Communication: No difficulties  Cognition Arousal/Alertness: Awake/alert Behavior During Therapy: WFL for tasks assessed/performed Overall Cognitive Status: Within Functional Limits for tasks assessed                                        General Comments      Exercises     Assessment/Plan    PT Assessment Patient needs continued PT services  PT Problem List Decreased activity tolerance;Decreased balance;Decreased mobility;Decreased knowledge of use of DME;Pain       PT Treatment Interventions DME instruction;Gait training;Stair training;Functional mobility training;Therapeutic activities;Therapeutic exercise;Balance training;Patient/family education    PT Goals (Current goals can be found in the Care Plan section)  Acute Rehab PT Goals Patient Stated Goal: to have less pain PT Goal Formulation: With patient Time For Goal Achievement: 01/11/17 Potential to Achieve Goals: Good    Frequency Min 3X/week   Barriers to discharge        Co-evaluation               AM-PAC PT "6 Clicks" Daily Activity  Outcome Measure Difficulty turning over in bed (including adjusting bedclothes, sheets and blankets)?: None Difficulty moving from lying on back to sitting on the side of the bed? : None Difficulty sitting down on and standing up from a chair with arms (e.g., wheelchair, bedside commode, etc,.)?: Total Help needed moving to and from a bed to chair (including a wheelchair)?: A Little Help needed  walking in hospital room?: A Little Help needed climbing 3-5 steps with a railing? : A Little 6 Click Score: 18    End of Session Equipment Utilized During Treatment: Gait belt Activity Tolerance: Patient tolerated treatment well Patient left: in bed;with call bell/phone within reach Nurse Communication: Mobility status PT Visit Diagnosis: Other abnormalities of gait and mobility (R26.89);Pain Pain - Right/Left: Right Pain - part of body: Leg    Time: 1610-9604 PT Time Calculation (min) (ACUTE ONLY): 18 min   Charges:   PT Evaluation $PT Eval Low Complexity: 1 Procedure     PT G Codes:        Colin Broach PT, DPT  [phone]    Roxy Manns 01/04/2017, 4:29 PM

## 2017-01-04 NOTE — Progress Notes (Signed)
Air leak noted in wound vac. Patch and sensi-track pad changed. Minimal air leak now. Call bell and phone within reach. Will continue to monitor.

## 2017-01-05 ENCOUNTER — Encounter (HOSPITAL_COMMUNITY): Payer: Self-pay | Admitting: Plastic Surgery

## 2017-01-05 LAB — BASIC METABOLIC PANEL
ANION GAP: 7 (ref 5–15)
CHLORIDE: 101 mmol/L (ref 101–111)
CO2: 27 mmol/L (ref 22–32)
Calcium: 8.7 mg/dL — ABNORMAL LOW (ref 8.9–10.3)
Creatinine, Ser: 0.8 mg/dL (ref 0.61–1.24)
GFR calc Af Amer: >60 mL/min (ref >60)
GLUCOSE: 262 mg/dL — AB (ref 65–99)
POTASSIUM: 3.6 mmol/L (ref 3.5–5.1)
Sodium: 135 mmol/L (ref 135–145)

## 2017-01-05 NOTE — Consult Note (Signed)
Reason for Consult: LE fasciotomy wound Referring Physician: B. Donzetta Matters MD Location: [facility]- inpatient Date: 6.29.18  Todd Shaw is an 31 y.o. male.  HPI: Admitted 6.23.18 with acute onset pain RLE. Work up including CT angio demonstrated occlusion AT right with reconstitution at ankle. No preceeding trauma. Underwent compartment release, AT muscle noted to not contract. Lateral compartment with contracting muscle. Patient currently unable to dorsiflex. Plastic Surgery consulted for skin graft to leg.  PMH significant for bipolar d/o. PTA 0.5 ppd tobacco. PTA living with family, not currently working. States bipolar controlled and followed at [facility].  Past Medical History:  Diagnosis Date  . Bipolar 1 disorder ([place])   . Hallucinations   . Psychosis     Past Surgical History:  Procedure Laterality Date  . APPLICATION OF WOUND VAC Right 01/01/2017   Procedure: RIGHT LEG WOUND VAC CHANGE with irrigation and DEBRIDEMENT of right leg fasciotomy site;  Surgeon: Rosetta Posner, MD;  Location: [place];  Service: Vascular;  Laterality: Right;  . APPLICATION OF WOUND VAC Right 01/03/2017   Procedure: WOUND VAC CHANGE;  Surgeon: Elam Dutch, MD;  Location: [place];  Service: Vascular;  Laterality: Right;  . FASCIOTOMY Right 12/31/2016   Procedure: FASCIOTOMY, RIGHT LOWER EXTREMITY COMPARTMENT;  Surgeon: Waynetta Sandy, MD;  Location: [place];  Service: Vascular;  Laterality: Right;  . I&D EXTREMITY Right 01/03/2017   Procedure: RIGHT LOWER EXTREMITY FASCIOTOMY WASH-OUT. DEBRIDEMENT;  Surgeon: Elam Dutch, MD;  Location: [facility] OR;  Service: Vascular;  Laterality: Right;    Family History  Problem Relation Age of Onset  . Mental illness Neg Hx     Social History: +tob, occassional marijuana,denies other drug use  Allergies: No Known Allergies  Medications: I have reviewed the patient's current medications.  Results for orders placed or performed during the hospital encounter of 12/30/16  (from the past 48 hour(s))  Basic metabolic panel     Status: Abnormal   Collection Time: 01/05/17  2:03 AM  Result Value Ref Range   Sodium 135 135 - 145 mmol/L   Potassium 3.6 3.5 - 5.1 mmol/L   Chloride 101 101 - 111 mmol/L   CO2 27 22 - 32 mmol/L   Glucose, Bld 262 (H) 65 - 99 mg/dL   BUN <5 (L) 6 - 20 mg/dL   Creatinine, Ser 0.80 0.61 - 1.24 mg/dL   Calcium 8.7 (L) 8.9 - 10.3 mg/dL   GFR calc non Af Amer >60 >60 mL/min   GFR calc Af Amer >60 >60 mL/min    Comment: (NOTE) The eGFR has been calculated using the CKD EPI equation. This calculation has not been validated in all clinical situations. eGFR's persistently <60 mL/min signify possible Chronic Kidney Disease.    Anion gap 7 5 - 15    ROS Blood pressure 126/73, pulse (!) 105, temperature 97.9 F (36.6 C), temperature source Oral, resp. rate 20, height '5\' 7"'$  (1.702 m), weight 99.8 kg (220 lb), SpO2 99 %. Physical Exam  Cardiovascular: Normal rate, regular rhythm and normal heart sounds.   Respiratory: Effort normal and breath sounds normal.   GEN: alert, cooperative RLE: no cellulitis right anterolateral leg with 22 x 9 cm open area with exposure AT muscle which is viable, no granulation tissue present clean +DP in boot  Assessment/Plan: Mother at bedside and both agreeable to surgery. Plan STSG with possible A Cell to donor site. Reviewed purpose graft, risk graft failure and need for prolonged wound care, patch like  appearance, no sensation. Similarly donor site with patch like appearance, appr 10 days for healing, purpose of A Cell to aid pain control and hasten healing donor site, porcine source. Scheduled for 7.2.18 in am. If patient eligbile for home VAC then anticipate d/c to home 7.3.18.  Todd Limbo, MD [facility]astic [facility] Surgery [phone], pin [phone]

## 2017-01-05 NOTE — Progress Notes (Signed)
Physical Therapy Treatment Patient Details Name: Todd Shaw MRN: [id number] DOB: 1986/01/20 Today's Date: 01/05/2017    History of Present Illness Pt is a 31 yo male admitted through ED on 12/30/16 with extreme right leg pain. 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 Pt underwent an fasciotomy with wound vac placement on 01/01/17 and fasciotomy wash-out with wound vac change on 01/03/17. PMH significant for Bipolar 1 with hallucinations and psychosis, smoker.     PT Comments    Pt states he is in a lot of pain at this time due to having wound vac change earlier.  RN states can not have pain med for another hr.  Pt agreeable to participate in PT session.  Increased gt distance but cont's to be limited by pain.  Plan to complete gt training with crutches next visit.       Follow Up Recommendations  No PT follow up     Equipment Recommendations  Crutches    Recommendations for Other Services       Precautions / Restrictions Precautions Precaution Comments: wound vac  Required Braces or Orthoses: Other Brace/Splint Other Brace/Splint: Prafo shoe RLE Restrictions RLE Weight Bearing: Non weight bearing    Mobility  Bed Mobility Overal bed mobility: Modified Independent Bed Mobility: Supine to Sit;Sit to Supine              Transfers Overall transfer level: Needs assistance Equipment used: Rolling walker (2 wheeled) Transfers: Sit to/from Stand Sit to Stand: Supervision         General transfer comment: supervision for safety  Ambulation/Gait Ambulation/Gait assistance: Min guard Ambulation Distance (Feet): 60 Feet Assistive device: Rolling walker (2 wheeled) Gait Pattern/deviations: Step-to pattern     General Gait Details: adheres to NWBing.  distance limited by pain.    Stairs            Wheelchair Mobility    Modified Rankin (Stroke Patients Only)       Balance                                             Cognition Arousal/Alertness: Awake/alert Behavior During Therapy: WFL for tasks assessed/performed Overall Cognitive Status: Within Functional Limits for tasks assessed                                        Exercises      General Comments        Pertinent Vitals/Pain Pain Assessment: 0-10 Pain Score: 8  Pain Location: right LE Pain Descriptors / Indicators: Aching;Burning;Grimacing Pain Intervention(s): Limited activity within patient's tolerance;Monitored during session;Repositioned;Patient requesting pain meds-RN notified    Home Living                      Prior Function            PT Goals (current goals can now be found in the care plan section) Acute Rehab PT Goals Patient Stated Goal: to have less pain PT Goal Formulation: With patient Time For Goal Achievement: 01/11/17 Potential to Achieve Goals: Good    Frequency    Min 3X/week      PT Plan Current plan remains appropriate    Co-evaluation  AM-PAC PT "6 Clicks" Daily Activity  Outcome Measure                   End of Session Equipment Utilized During Treatment: Gait belt Activity Tolerance: Patient limited by pain Patient left: in bed;with call bell/phone within reach;with family/visitor present Nurse Communication: Mobility status       Time: 6578-4696 PT Time Calculation (min) (ACUTE ONLY): 18 min  Charges:  $Gait Training: 8-22 mins                    G CodesVerdell Face, [place] [phone] 01/05/2017    Lara Mulch 01/05/2017, 3:22 PM

## 2017-01-05 NOTE — Progress Notes (Signed)
New wound vac dressing placed to 125 continuous suction. Pt tolerated fair. Pain meds admistered. Call bell and phone within reach. Will continue to monitor.

## 2017-01-05 NOTE — Progress Notes (Signed)
  Progress Note    01/05/2017 8:37 AM 2 Days Post-Op  Subjective: pain improving  Vitals:   01/04/17 2003 01/05/17 0500  BP: 139/79 126/73  Pulse: 95 (!) 105  Resp: 18 20  Temp: 98 F (36.7 C) 97.9 F (36.6 C)    Physical Exam: He is able to minimally dorsiflex his right ankle All muscle appears viable with significant gap between skin edges, anterior compartment is edematous Palpable dp/pt on right  CBC    Component Value Date/Time   WBC 9.1 01/03/2017 0345   RBC 4.57 01/03/2017 0345   HGB 13.9 01/03/2017 0345   HCT 42.3 01/03/2017 0345   PLT 259 01/03/2017 0345   MCV 92.6 01/03/2017 0345   MCH 30.4 01/03/2017 0345   MCHC 32.9 01/03/2017 0345   RDW 13.8 01/03/2017 0345   LYMPHSABS 1.9 01/01/2017 0353   MONOABS 1.6 (H) 01/01/2017 0353   EOSABS 0.0 01/01/2017 0353   BASOSABS 0.0 01/01/2017 0353    BMET    Component Value Date/Time   NA 135 01/05/2017 0203   K 3.6 01/05/2017 0203   CL 101 01/05/2017 0203   CO2 27 01/05/2017 0203   GLUCOSE 262 (H) 01/05/2017 0203   BUN <5 (L) 01/05/2017 0203   CREATININE 0.80 01/05/2017 0203   CALCIUM 8.7 (L) 01/05/2017 0203   GFRNONAA >60 01/05/2017 0203   GFRAA >60 01/05/2017 0203    INR No results found for: INR   Intake/Output Summary (Last 24 hours) at 01/05/17 0837 Last data filed at 01/05/17 [id number]  Gross per 24 hour  Intake              360 ml  Output             3500 ml  Net            -3140 ml     Assessment:  31 y.o. male is s/p anterior and lateral fasciotomy for spontaneous compartment syndrome  Plan: Will ask plastic surgery to evaluate Wet to dry placed this morning, can replace vac after Dr. Leta Baptisthimmappa has opportunity to evaluate Encouraged oob   Brandon C. Randie Heinzain, MD [facility] [place] Office: [phone] Pager: [phone]  01/05/2017 8:37 AM

## 2017-01-06 NOTE — Progress Notes (Addendum)
  Progress Note  01/06/2017 8:49 AM 3 Days Post-Op  Subjective:  Says he feels like the sponge made it worse; says his pain is pretty well controlled.  Afebrile HR  100's  120's-130's systolic 100% RA  Vitals:   01/05/17 2235 01/06/17 0508  BP: 132/67 121/82  Pulse: (!) 108 (!) 101  Resp: 18 18  Temp: 98.6 F (37 C) 98.2 F (36.8 C)    Physical Exam: Lungs:  Non labored Incisions:  RLE with wound vac with good seal Extremities:  Wiggles toes today, but not as good as yesterday   CBC    Component Value Date/Time   WBC 9.1 01/03/2017 0345   RBC 4.57 01/03/2017 0345   HGB 13.9 01/03/2017 0345   HCT 42.3 01/03/2017 0345   PLT 259 01/03/2017 0345   MCV 92.6 01/03/2017 0345   MCH 30.4 01/03/2017 0345   MCHC 32.9 01/03/2017 0345   RDW 13.8 01/03/2017 0345   LYMPHSABS 1.9 01/01/2017 0353   MONOABS 1.6 (H) 01/01/2017 0353   EOSABS 0.0 01/01/2017 0353   BASOSABS 0.0 01/01/2017 0353    BMET    Component Value Date/Time   NA 135 01/05/2017 0203   K 3.6 01/05/2017 0203   CL 101 01/05/2017 0203   CO2 27 01/05/2017 0203   GLUCOSE 262 (H) 01/05/2017 0203   BUN <5 (L) 01/05/2017 0203   CREATININE 0.80 01/05/2017 0203   CALCIUM 8.7 (L) 01/05/2017 0203   GFRNONAA >60 01/05/2017 0203   GFRAA >60 01/05/2017 0203    INR No results found for: INR   Intake/Output Summary (Last 24 hours) at 01/06/17 0849 Last data filed at 01/06/17 0705  Gross per 24 hour  Intake              360 ml  Output             2540 ml  Net            -2180 ml     Assessment:  31 y.o. male is s/p:  rle fasciotomy for spontaneous anterior compartment syndrome and I and D of right leg fasciotomy with placement of VAC dressing yesterday  3 Days Post-Op  Plan: -doing okay this morning-doesn't wiggle his toes as much today  -for STSG on Monday with plastics. -continue PT -DVT prophylaxis:  Heparin SQ   Doreatha MassedSamantha Rhyne, PA-C [facility] [phone] 01/06/2017 8:49  AM  I have interviewed the patient and examined the patient. I agree with the findings by the PA. Appreciate Dr. Maude Lerichehimmappa's help.  Cont VAC  Cari Carawayhris Dickson, MD [phone]

## 2017-01-06 NOTE — Progress Notes (Signed)
PT Cancellation Note  Patient Details Name: Todd Shaw MRN: [id number] DOB: 1985-11-10   Cancelled Treatment:    Reason Eval/Treat Not Completed: Patient declined, no reason specified  Patient reports that he prefers to have rolling walker if he is to be sent home with wound-vac. I agree this would be more appropriate than trying to carry vac with crutches. If pt does not go home with wound-vac. We will return gait training with alternative device to ensure NWB through RLE. Declines PT today, also states he is confident he can navigate steps with RW despite encouragement to practice at least once prior to d/c.  Berton MountLogan S Darwin Guastella 01/06/2017, 11:27 AM  Charlsie MerlesLogan Secor Bonifacio Pruden, PT, DPT Weekend Pager [phone]

## 2017-01-07 NOTE — Progress Notes (Addendum)
  Progress Note    01/07/2017 8:55 AM 4 Days Post-Op  Subjective:  C/o pain at wound vac site.  Did not ask for pain med; RN getting now  Afebrile VSS 95% RA  Vitals:   01/06/17 2005 01/07/17 0426  BP: 134/74 125/66  Pulse: (!) 102 (!) 102  Resp: 19 20  Temp: 98.4 F (36.9 C) 98.3 F (36.8 C)    Physical Exam: Lungs:  Non labored Incisions:  Right lateral leg with wound vac with good seal Extremities:  Able to wiggle toes; foot is in boot.   CBC    Component Value Date/Time   WBC 9.1 01/03/2017 0345   RBC 4.57 01/03/2017 0345   HGB 13.9 01/03/2017 0345   HCT 42.3 01/03/2017 0345   PLT 259 01/03/2017 0345   MCV 92.6 01/03/2017 0345   MCH 30.4 01/03/2017 0345   MCHC 32.9 01/03/2017 0345   RDW 13.8 01/03/2017 0345   LYMPHSABS 1.9 01/01/2017 0353   MONOABS 1.6 (H) 01/01/2017 0353   EOSABS 0.0 01/01/2017 0353   BASOSABS 0.0 01/01/2017 0353    BMET    Component Value Date/Time   NA 135 01/05/2017 0203   K 3.6 01/05/2017 0203   CL 101 01/05/2017 0203   CO2 27 01/05/2017 0203   GLUCOSE 262 (H) 01/05/2017 0203   BUN <5 (L) 01/05/2017 0203   CREATININE 0.80 01/05/2017 0203   CALCIUM 8.7 (L) 01/05/2017 0203   GFRNONAA >60 01/05/2017 0203   GFRAA >60 01/05/2017 0203    INR No results found for: INR   Intake/Output Summary (Last 24 hours) at 01/07/17 0855 Last data filed at 01/07/17 0700  Gross per 24 hour  Intake              480 ml  Output             1165 ml  Net             -685 ml     Assessment:  31 y.o. male is s/p:  rle fasciotomy for spontaneous anterior compartment syndrome and I and D of right leg fasciotomy with placement of VAC dressing  4 Days Post-Op  Plan: -pt still able to wiggle toes-for STSG tomorrow with plastics -discussed with pt nothing to eat or drink after MN -DVT prophylaxis:  SQ heparin -continue PT and mobilization   Doreatha MassedSamantha Rhyne, PA-C [facility] [phone] 01/07/2017 8:55 AM  I have  interviewed the patient and examined the patient. I agree with the findings by the PA. He is for split-thickness skin graft tomorrow. Is continuing to have some pain in the right leg and I instructed him and positioned him in the correct position to reduce swelling. VAC with good seal.   Cari Carawayhris Dickson, MD [phone]

## 2017-01-07 NOTE — Evaluation (Signed)
Occupational Therapy Evaluation Patient Details Name: Todd Shaw MRN: [id number] DOB: 04-06-1986 Today's Date: 01/07/2017    History of Present Illness Pt is a 31 yo male admitted through ED on 12/30/16 with extreme right leg pain. 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 Pt underwent an fasciotomy with wound vac placement on 01/01/17 and fasciotomy wash-out with wound vac change on 01/03/17. PMH significant for Bipolar 1 with hallucinations and psychosis, smoker.    Clinical Impression   Pt reports he was independent with ADL PTA. Currently pt min guard for functional mobility and min assist for LB ADL. Pt agreeable to participate in therapy but limited by pain. Pt planning to d/c home with intermittent supervision from family. Pt would benefit from continued skilled OT to address established goals.    Follow Up Recommendations  No OT follow up;Supervision - Intermittent    Equipment Recommendations  3 in 1 bedside commode;Tub/shower bench    Recommendations for Other Services       Precautions / Restrictions Precautions Precaution Comments: wound vac RLE Required Braces or Orthoses: Other Brace/Splint Other Brace/Splint: Prafo shoe RLE Restrictions Weight Bearing Restrictions: Yes RLE Weight Bearing: Non weight bearing      Mobility Bed Mobility Overal bed mobility: Needs Assistance Bed Mobility: Supine to Sit     Supine to sit: Supervision     General bed mobility comments: due to wound vac  Transfers Overall transfer level: Needs assistance Equipment used: Rolling walker (2 wheeled) Transfers: Sit to/from Stand Sit to Stand: Min guard         General transfer comment: for safety due to pain. cues for hand placement    Balance Overall balance assessment: Needs assistance Sitting-balance support: Feet supported;No upper extremity supported Sitting balance-Leahy Scale: Normal      Standing balance support: During functional activity;Bilateral upper extremity supported Standing balance-Leahy Scale: Poor Standing balance comment: leaning on sink for support during hand washing                           ADL either performed or assessed with clinical judgement   ADL Overall ADL's : Needs assistance/impaired Eating/Feeding: Set up   Grooming: Min guard;Standing;Wash/dry hands   Upper Body Bathing: Set up;Sitting   Lower Body Bathing: Minimal assistance;Sit to/from stand   Upper Body Dressing : Set up;Sitting   Lower Body Dressing: Minimal assistance;Sit to/from stand   Toilet Transfer: Min guard;Ambulation;RW   Toileting- Architect and Hygiene: Min guard;Sit to/from stand       Functional mobility during ADLs: Hydrographic surveyor     Praxis      Pertinent Vitals/Pain Pain Assessment: Faces Faces Pain Scale: Hurts whole lot Pain Location: RLE Pain Descriptors / Indicators: Sharp;Throbbing Pain Intervention(s): Monitored during session;Limited activity within patient's tolerance;Patient requesting pain meds-RN notified     Hand Dominance Right   Extremity/Trunk Assessment Upper Extremity Assessment Upper Extremity Assessment: Overall WFL for tasks assessed   Lower Extremity Assessment Lower Extremity Assessment: Defer to PT evaluation   Cervical / Trunk Assessment Cervical / Trunk Assessment: Normal   Communication Communication Communication: No difficulties   Cognition Arousal/Alertness: Awake/alert Behavior During Therapy: WFL for tasks assessed/performed Overall Cognitive Status: Within Functional Limits for tasks assessed  General Comments       Exercises     Shoulder Instructions      Home Living Family/patient expects to be discharged to:: Private residence Living Arrangements: Parent Available Help at  Discharge: Family;Available PRN/intermittently Type of Home: House Home Access: Stairs to enter Entergy CorporationEntrance Stairs-Number of Steps: 6 Entrance Stairs-Rails: Right;Left;Can reach both Home Layout: Multi-level;Bed/bath upstairs     Bathroom Shower/Tub: Tub/shower unit;Curtain   Bathroom Toilet: Standard     Home Equipment: None          Prior Functioning/Environment Level of Independence: Independent                 OT Problem List: Decreased activity tolerance;Impaired balance (sitting and/or standing);Decreased knowledge of use of DME or AE;Decreased knowledge of precautions;Obesity;Pain;Increased edema      OT Treatment/Interventions: Self-care/ADL training;Therapeutic exercise;Energy conservation;DME and/or AE instruction;Therapeutic activities;Patient/family education;Balance training    OT Goals(Current goals can be found in the care plan section) Acute Rehab OT Goals Patient Stated Goal: decrease pain OT Goal Formulation: With patient Time For Goal Achievement: 01/21/17 Potential to Achieve Goals: Good ADL Goals Pt Will Perform Lower Body Bathing: sit to/from stand;with modified independence Pt Will Perform Lower Body Dressing: sit to/from stand;with modified independence Pt Will Transfer to Toilet: ambulating;bedside commode;with modified independence (over toilet) Pt Will Perform Toileting - Clothing Manipulation and hygiene: sit to/from stand;with modified independence Pt Will Perform Tub/Shower Transfer: Tub transfer;with modified independence;ambulating;tub bench;rolling walker  OT Frequency: Min 2X/week   Barriers to D/C: Inaccessible home environment  stairs to bed/bath       Co-evaluation              AM-PAC PT "6 Clicks" Daily Activity     Outcome Measure Help from another person eating meals?: None Help from another person taking care of personal grooming?: A Little Help from another person toileting, which includes using toliet, bedpan, or  urinal?: A Little Help from another person bathing (including washing, rinsing, drying)?: A Little Help from another person to put on and taking off regular upper body clothing?: None Help from another person to put on and taking off regular lower body clothing?: A Little 6 Click Score: 20   End of Session Equipment Utilized During Treatment: Rolling walker Nurse Communication: Mobility status;Patient requests pain meds  Activity Tolerance: Patient tolerated treatment well;Patient limited by pain Patient left: in bed;with call bell/phone within reach  OT Visit Diagnosis: Other abnormalities of gait and mobility (R26.89);Unsteadiness on feet (R26.81);Pain Pain - Right/Left: Right Pain - part of body: Leg                Time: [id number] OT Time Calculation (min): 14 min Charges:  OT General Charges $OT Visit: 1 Procedure OT Evaluation $OT Eval Moderate Complexity: 1 Procedure G-Codes:     Todd Shaw A. Brett Albinooffey, M.S., OTR/L Pager: [phone]  Gaye AlkenBailey A Dagen Shaw 01/07/2017, 11:05 AM

## 2017-01-07 NOTE — Plan of Care (Signed)
Problem: Pain Managment: Goal: General experience of comfort will improve Outcome: Progressing Pain medication given as ordered.

## 2017-01-08 ENCOUNTER — Encounter (HOSPITAL_COMMUNITY): Payer: Self-pay | Admitting: Certified Registered Nurse Anesthetist

## 2017-01-08 ENCOUNTER — Encounter (HOSPITAL_COMMUNITY)
Admission: EM | Disposition: A | Discharge status: Home / Self Care | Payer: Self-pay | Source: Home / Self Care | Attending: Vascular Surgery

## 2017-01-08 ENCOUNTER — Inpatient Hospital Stay (HOSPITAL_COMMUNITY): Payer: Self-pay | Admitting: Certified Registered Nurse Anesthetist

## 2017-01-08 HISTORY — PX: APPLICATION OF A-CELL OF EXTREMITY: SHX6303

## 2017-01-08 HISTORY — PX: SKIN SPLIT GRAFT: SHX444

## 2017-01-08 SURGERY — APPLICATION, GRAFT, SKIN, SPLIT-THICKNESS
Anesthesia: General | Site: Thigh | Laterality: Right

## 2017-01-08 MED ORDER — LACTATED RINGERS IV SOLN
INTRAVENOUS | Status: DC | PRN
  Administered 2017-01-08 (×2): via INTRAVENOUS

## 2017-01-08 MED ORDER — OXYCODONE-ACETAMINOPHEN 5-325 MG PO TABS
ORAL_TABLET | ORAL | Status: AC
  Filled 2017-01-08: qty 2

## 2017-01-08 MED ORDER — LIDOCAINE 2% (20 MG/ML) 5 ML SYRINGE
INTRAMUSCULAR | Status: AC
  Filled 2017-01-08: qty 5

## 2017-01-08 MED ORDER — MINERAL OIL LIGHT 100 % EX OIL
TOPICAL_OIL | CUTANEOUS | Status: AC
  Filled 2017-01-08: qty 50

## 2017-01-08 MED ORDER — DEXAMETHASONE SODIUM PHOSPHATE 10 MG/ML IJ SOLN
INTRAMUSCULAR | Status: DC | PRN
  Administered 2017-01-08: 10 mg via INTRAVENOUS

## 2017-01-08 MED ORDER — DEXAMETHASONE SODIUM PHOSPHATE 10 MG/ML IJ SOLN
INTRAMUSCULAR | Status: AC
  Filled 2017-01-08: qty 1

## 2017-01-08 MED ORDER — ONDANSETRON HCL 4 MG/2ML IJ SOLN
INTRAMUSCULAR | Status: DC | PRN
  Administered 2017-01-08: 4 mg via INTRAVENOUS

## 2017-01-08 MED ORDER — PROPOFOL 10 MG/ML IV BOLUS
INTRAVENOUS | Status: AC
  Filled 2017-01-08: qty 40

## 2017-01-08 MED ORDER — MINERAL OIL LIGHT 100 % EX OIL
TOPICAL_OIL | CUTANEOUS | Status: DC | PRN
  Administered 2017-01-08: 1 via TOPICAL

## 2017-01-08 MED ORDER — HYDROMORPHONE HCL 1 MG/ML IJ SOLN
INTRAMUSCULAR | Status: AC
  Administered 2017-01-08: 0.5 mg via INTRAVENOUS
  Filled 2017-01-08: qty 0.5

## 2017-01-08 MED ORDER — CEFAZOLIN SODIUM 1 G IJ SOLR
INTRAMUSCULAR | Status: AC
  Filled 2017-01-08: qty 20

## 2017-01-08 MED ORDER — 0.9 % SODIUM CHLORIDE (POUR BTL) OPTIME
TOPICAL | Status: DC | PRN
  Administered 2017-01-08: 1000 mL

## 2017-01-08 MED ORDER — ONDANSETRON HCL 4 MG/2ML IJ SOLN
INTRAMUSCULAR | Status: AC
  Filled 2017-01-08: qty 2

## 2017-01-08 MED ORDER — LIDOCAINE 2% (20 MG/ML) 5 ML SYRINGE
INTRAMUSCULAR | Status: DC | PRN
  Administered 2017-01-08: 100 mg via INTRAVENOUS

## 2017-01-08 MED ORDER — MIDAZOLAM HCL 2 MG/2ML IJ SOLN
INTRAMUSCULAR | Status: DC | PRN
  Administered 2017-01-08: 2 mg via INTRAVENOUS

## 2017-01-08 MED ORDER — LACTATED RINGERS IV SOLN
INTRAVENOUS | Status: DC
  Administered 2017-01-08: 10:00:00 via INTRAVENOUS

## 2017-01-08 MED ORDER — CEFAZOLIN SODIUM 1 G IJ SOLR
INTRAMUSCULAR | Status: DC | PRN
  Administered 2017-01-08: 2 g via INTRAMUSCULAR

## 2017-01-08 MED ORDER — PROPOFOL 10 MG/ML IV BOLUS
INTRAVENOUS | Status: DC | PRN
  Administered 2017-01-08: 200 mg via INTRAVENOUS

## 2017-01-08 MED ORDER — FENTANYL CITRATE (PF) 250 MCG/5ML IJ SOLN
INTRAMUSCULAR | Status: AC
  Filled 2017-01-08: qty 5

## 2017-01-08 MED ORDER — HYDROMORPHONE HCL 1 MG/ML IJ SOLN
INTRAMUSCULAR | Status: AC
  Filled 2017-01-08: qty 0.5

## 2017-01-08 MED ORDER — FENTANYL CITRATE (PF) 100 MCG/2ML IJ SOLN
INTRAMUSCULAR | Status: DC | PRN
  Administered 2017-01-08 (×2): 25 ug via INTRAVENOUS
  Administered 2017-01-08: 50 ug via INTRAVENOUS
  Administered 2017-01-08: 25 ug via INTRAVENOUS

## 2017-01-08 MED ORDER — MIDAZOLAM HCL 2 MG/2ML IJ SOLN
INTRAMUSCULAR | Status: AC
  Filled 2017-01-08: qty 2

## 2017-01-08 MED ORDER — PROMETHAZINE HCL 25 MG/ML IJ SOLN: 6.2500 mg | INTRAMUSCULAR | Status: DC | PRN

## 2017-01-08 MED ORDER — HYDROMORPHONE HCL 1 MG/ML IJ SOLN
0.2500 mg | INTRAMUSCULAR | Status: DC | PRN
  Administered 2017-01-08 (×2): 0.5 mg via INTRAVENOUS

## 2017-01-08 SURGICAL SUPPLY — 80 items
APPLIER CLIP 9.375 MED OPEN (MISCELLANEOUS)
BAG DECANTER FOR FLEXI CONT (MISCELLANEOUS) IMPLANT
BANDAGE ACE 3X5.8 VEL STRL LF (GAUZE/BANDAGES/DRESSINGS) IMPLANT
BANDAGE ACE 4X5 VEL STRL LF (GAUZE/BANDAGES/DRESSINGS) IMPLANT
BANDAGE ACE 6X5 VEL STRL LF (GAUZE/BANDAGES/DRESSINGS) IMPLANT
BANDAGE ELASTIC 6 VELCRO ST LF (GAUZE/BANDAGES/DRESSINGS) ×4 IMPLANT
BLADE CLIPPER SURG (BLADE) ×4 IMPLANT
BLADE DERMATOME II (BLADE) ×4 IMPLANT
BLADE SURG 10 STRL SS (BLADE) ×4 IMPLANT
BLADE SURG 15 STRL LF DISP TIS (BLADE) ×2 IMPLANT
BLADE SURG 15 STRL SS (BLADE) ×2
BNDG COHESIVE 4X5 TAN STRL (GAUZE/BANDAGES/DRESSINGS) IMPLANT
BNDG COHESIVE 6X5 TAN STRL LF (GAUZE/BANDAGES/DRESSINGS) ×4 IMPLANT
BNDG GAUZE ELAST 4 BULKY (GAUZE/BANDAGES/DRESSINGS) ×4 IMPLANT
CANISTER SUCT 3000ML PPV (MISCELLANEOUS) IMPLANT
CANISTER WOUND CARE 500ML ATS (WOUND CARE) ×8 IMPLANT
CLIP APPLIE 9.375 MED OPEN (MISCELLANEOUS) IMPLANT
CONT SPECI 4OZ STER CLIK (MISCELLANEOUS) IMPLANT
COVER SURGICAL LIGHT HANDLE (MISCELLANEOUS) ×4 IMPLANT
DERMACARRIERS GRAFT 1 TO 1.5 (DISPOSABLE) ×4
DRAIN WOUND SNY 15 RND (WOUND CARE) IMPLANT
DRAPE HALF SHEET 40X57 (DRAPES) IMPLANT
DRAPE INCISE IOBAN 66X45 STRL (DRAPES) IMPLANT
DRESSING TELFA 8X10 (GAUZE/BANDAGES/DRESSINGS) IMPLANT
DRSG ADAPTIC 3X8 NADH LF (GAUZE/BANDAGES/DRESSINGS) ×4 IMPLANT
DRSG PAD ABDOMINAL 8X10 ST (GAUZE/BANDAGES/DRESSINGS) ×4 IMPLANT
DRSG TELFA 3X8 NADH (GAUZE/BANDAGES/DRESSINGS) ×4 IMPLANT
DRSG VAC ATS LRG SENSATRAC (GAUZE/BANDAGES/DRESSINGS) ×4 IMPLANT
DRSG VAC ATS MED SENSATRAC (GAUZE/BANDAGES/DRESSINGS) IMPLANT
DRSG VAC ATS SM SENSATRAC (GAUZE/BANDAGES/DRESSINGS) IMPLANT
ELECT COATED BLADE 2.86 ST (ELECTRODE) ×4 IMPLANT
ELECT REM PT RETURN 9FT ADLT (ELECTROSURGICAL) ×8
ELECTRODE REM PT RTRN 9FT ADLT (ELECTROSURGICAL) ×4 IMPLANT
EVACUATOR SILICONE 100CC (DRAIN) IMPLANT
GAUZE SPONGE 4X4 12PLY STRL (GAUZE/BANDAGES/DRESSINGS) IMPLANT
GEL ULTRASOUND 20GR AQUASONIC (MISCELLANEOUS) IMPLANT
GLOVE BIO SURGEON STRL SZ 6 (GLOVE) ×4 IMPLANT
GOWN STRL REUS W/ TWL LRG LVL3 (GOWN DISPOSABLE) ×4 IMPLANT
GOWN STRL REUS W/TWL LRG LVL3 (GOWN DISPOSABLE) ×4
GRAFT DERMACARRIERS 1 TO 1.5 (DISPOSABLE) ×2 IMPLANT
KIT BASIN OR (CUSTOM PROCEDURE TRAY) ×4 IMPLANT
KIT ROOM TURNOVER OR (KITS) ×4 IMPLANT
MATRIX SURGICAL PSM 10X15CM (Tissue) ×4 IMPLANT
NS IRRIG 1000ML POUR BTL (IV SOLUTION) ×4 IMPLANT
PACK GENERAL/GYN (CUSTOM PROCEDURE TRAY) ×8 IMPLANT
PACK SURGICAL SETUP 50X90 (CUSTOM PROCEDURE TRAY) IMPLANT
PAD ARMBOARD 7.5X6 YLW CONV (MISCELLANEOUS) ×4 IMPLANT
PAD CAST 3X4 CTTN HI CHSV (CAST SUPPLIES) IMPLANT
PAD CAST 4YDX4 CTTN HI CHSV (CAST SUPPLIES) IMPLANT
PAD NEG PRESSURE SENSATRAC (MISCELLANEOUS) ×4 IMPLANT
PADDING CAST COTTON 3X4 STRL (CAST SUPPLIES)
PADDING CAST COTTON 4X4 STRL (CAST SUPPLIES)
PADDING CAST COTTON 6X4 STRL (CAST SUPPLIES) IMPLANT
PENCIL BUTTON HOLSTER BLD 10FT (ELECTRODE) IMPLANT
PIN SAFETY STERILE (MISCELLANEOUS) ×4 IMPLANT
SPONGE LAP 18X18 X RAY DECT (DISPOSABLE) IMPLANT
STAPLER VISISTAT 35W (STAPLE) IMPLANT
STOCKINETTE IMPERVIOUS LG (DRAPES) ×4 IMPLANT
SURGILUBE 2OZ TUBE FLIPTOP (MISCELLANEOUS) IMPLANT
SUT CHROMIC 4 0 PS 2 18 (SUTURE) ×8 IMPLANT
SUT ETHILON 2 0 FS 18 (SUTURE) IMPLANT
SUT MNCRL AB 4-0 PS2 18 (SUTURE) IMPLANT
SUT PDS AB 2-0 CT2 27 (SUTURE) ×4 IMPLANT
SUT VIC AB 3-0 FS2 27 (SUTURE) IMPLANT
SUT VIC AB 4-0 PS2 27 (SUTURE) IMPLANT
SUT VIC AB 5-0 P-3 18XBRD (SUTURE) IMPLANT
SUT VIC AB 5-0 P3 18 (SUTURE)
SUT VIC AB 5-0 PS2 18 (SUTURE) IMPLANT
SUT VICRYL 4-0 PS2 18IN ABS (SUTURE) IMPLANT
SWAB CULTURE ESWAB REG 1ML (MISCELLANEOUS) IMPLANT
SWAB CULTURE LIQUID MINI MALE (MISCELLANEOUS) IMPLANT
SYR BULB IRRIGATION 50ML (SYRINGE) IMPLANT
SYR CONTROL 10ML LL (SYRINGE) IMPLANT
TAPE CLOTH SURG 4X10 WHT LF (GAUZE/BANDAGES/DRESSINGS) IMPLANT
TOWEL OR 17X24 6PK STRL BLUE (TOWEL DISPOSABLE) IMPLANT
TOWEL OR 17X26 10 PK STRL BLUE (TOWEL DISPOSABLE) ×4 IMPLANT
TUBE CONNECTING 12'X1/4 (SUCTIONS)
TUBE CONNECTING 12X1/4 (SUCTIONS) IMPLANT
UNDERPAD 30X30 (UNDERPADS AND DIAPERS) ×4 IMPLANT
YANKAUER SUCT BULB TIP NO VENT (SUCTIONS) ×4 IMPLANT

## 2017-01-08 NOTE — Progress Notes (Signed)
  Progress Note    01/08/2017 7:44 AM 5 Days Post-Op  Subjective: pain improving rle   Vitals:   01/07/17 2032 01/08/17 0407  BP: 114/85 136/70  Pulse: (!) 133 (!) 124  Resp: 19 18  Temp: 98.8 F (37.1 C) 98.2 F (36.8 C)    Physical Exam: [id number] Right leg is softer Palpable right dp/pt Minimal motion in right foot  CBC    Component Value Date/Time   WBC 9.1 01/03/2017 0345   RBC 4.57 01/03/2017 0345   HGB 13.9 01/03/2017 0345   HCT 42.3 01/03/2017 0345   PLT 259 01/03/2017 0345   MCV 92.6 01/03/2017 0345   MCH 30.4 01/03/2017 0345   MCHC 32.9 01/03/2017 0345   RDW 13.8 01/03/2017 0345   LYMPHSABS 1.9 01/01/2017 0353   MONOABS 1.6 (H) 01/01/2017 0353   EOSABS 0.0 01/01/2017 0353   BASOSABS 0.0 01/01/2017 0353    BMET    Component Value Date/Time   NA 135 01/05/2017 0203   K 3.6 01/05/2017 0203   CL 101 01/05/2017 0203   CO2 27 01/05/2017 0203   GLUCOSE 262 (H) 01/05/2017 0203   BUN <5 (L) 01/05/2017 0203   CREATININE 0.80 01/05/2017 0203   CALCIUM 8.7 (L) 01/05/2017 0203   GFRNONAA >60 01/05/2017 0203   GFRAA >60 01/05/2017 0203    INR No results found for: INR   Intake/Output Summary (Last 24 hours) at 01/08/17 0744 Last data filed at 01/08/17 0410  Gross per 24 hour  Intake             1120 ml  Output             2075 ml  Net             -955 ml     Assessment:  31 y.o. male is s/p right leg fasciotomy  Plan: On for skin grafting today with Dr .Leta Baptisthimmappa.    Zairah Arista C. Randie Heinzain, MD [facility] [place] Office: [phone] Pager: [phone]  01/08/2017 7:44 AM

## 2017-01-08 NOTE — Transfer of Care (Signed)
Immediate Anesthesia Transfer of Care Note  Patient: Todd Shaw  Procedure(s) Performed: Procedure(s): SKIN GRAFT SPLIT THICKNESS from right thigh to right leg (Right) APPLICATION OF A-CELL RIGHT LOWER LEG (Right)  Patient Location: PACU  Anesthesia Type:General  Level of Consciousness: awake, alert  and oriented  Airway & Oxygen Therapy: Patient Spontanous Breathing  Post-op Assessment: Report given to RN, Post -op Vital signs reviewed and stable and Patient moving all extremities X 4  Post vital signs: Reviewed and stable  Last Vitals:  Vitals:   01/07/17 2032 01/08/17 0407  BP: 114/85 136/70  Pulse: (!) 133 (!) 124  Resp: 19 18  Temp: 37.1 C 36.8 C    Last Pain:  Vitals:   01/08/17 0525  TempSrc:   PainSc: Asleep      Patients Stated Pain Goal: 1 (01/06/17 1958)  Complications: No apparent anesthesia complications

## 2017-01-08 NOTE — Op Note (Signed)
Operative Note   DATE OF OPERATION: 7.2.18  LOCATION: [facility]-inpatient  SURGICAL DIVISION: Plastic Surgery  PREOPERATIVE DIAGNOSES:  1. History right lower extremity compartment syndrome 2. Open wound to muscle right leg  POSTOPERATIVE DIAGNOSES:  same  PROCEDURE:  1. Layered closure right leg 6 cm 2. Split thickness skin graft from right thigh to right leg 110 cm2 3. Application A Cell matrix 150 cm2 to right thigh donor site  SURGEON: Todd FellowsBrinda Raylynn Hersh MD MBA  ASSISTANT: none   ANESTHESIA:  General.   EBL: 20 ml  COMPLICATIONS: None immediate.   INDICATIONS FOR PROCEDURE:  The patient, Todd Shaw, is a 31 y.o. male born on 05-Jan-1986, is here for skin grafting to right leg following compartment release.   FINDINGS: Able to primarily close additional 6 cm of incision. Remaining open wound with exposure anterior tibialis 19 x 6 cm  DESCRIPTION OF PROCEDURE:  The patient's operative site was marked with the patient in the preoperative area. The patient was taken to the operating room. SCDs were placed and IV antibiotics were given. The patient's operative site was prepped and draped in a sterile fashion. A time out was performed and all information was confirmed to be correct. Layered closure of proximal extent and distal extent incision completed with interrupted 2-0 PDS in superficial fascia followed by staples for skin closure, length 6 cm. Split thickness skin graft harvested at 12/1000th of inch from right thigh and meshed to 1:1.5 ration and inset over wound with running 4-0 chromic. Adaptic placed over graft followed by black VAC foam and this was set to 125 mm Hg continuous. A Cell prepared and applied with right thigh donor site, total 150 cm2. Adaptic applied followed by dry dressing Kerlix and Ace wrap.  The patient was allowed to wake from anesthesia, extubated and taken to the recovery room in satisfactory condition.   SPECIMENS: none  DRAINS: none  Todd FellowsBrinda  Todd Pooley, MD [facility] [phone], pin [phone]

## 2017-01-08 NOTE — Care Management Note (Addendum)
Case Management Note [name] RN, BSN Unit 2W-Case Manager [phone]  Patient Details  Name: Todd Shaw MRN: [id number] Date of Birth: 1986/02/12  Subjective/Objective:  Pt admitted with compartment syndrome s/p ant./post. Fasciotomy with wound VAC placment                  Action/Plan:  PTA pt lived at home- referral received for PCP needs- CM will follow for this and possible HH needs for ?wound VAC at home- plan for return to OR on 6/27 for further washout and wound VAC change- [name].   Expected Discharge Date:                  Expected Discharge Plan:  Home w Home Health Services  In-House Referral:     Discharge planning Services  CM Consult, Indigent Health Clinic  Post Acute Care Choice:  Durable Medical Equipment, Home Health Choice offered to:  Patient  DME Arranged:  Vac DME Agency:  [facility]  HH Arranged:    HH Agency:     Status of Service:  In process, will continue to follow  If discussed at Long Length of Stay Meetings, dates discussed:    Discharge Disposition:   Additional Comments:  01/08/17- 1430- Donn PieriniKristi Lilyonna Steidle RN, CM- received call from MD-(Plastics-[facility])- pt will need home wound VAC- per message wound VAC forms have been signed- CM will start [facility]- [name] she came by and picked up wound VAC forms- will have pt [facility] out [facility] and fax form to [name] when completed- CM to continue to follow for d/c needs-  14[address]- Charity Care Program forms completed by patient- and [name]- pt also states that he will need RW for home- will ask for order prior to discharge.  Darrold SpanWebster, [name], RN 01/08/2017, 2:05 PM

## 2017-01-08 NOTE — Anesthesia Postprocedure Evaluation (Signed)
Anesthesia Post Note  Patient: Todd Shaw  Procedure(s) Performed: Procedure(s) (LRB): SKIN GRAFT SPLIT THICKNESS from right thigh to right leg (Right) APPLICATION OF A-CELL RIGHT LOWER LEG (Right)     Patient location during evaluation: PACU Anesthesia Type: General Level of consciousness: sedated Pain management: pain level controlled Vital Signs Assessment: post-procedure vital signs reviewed and stable Respiratory status: spontaneous breathing and respiratory function stable Cardiovascular status: stable Anesthetic complications: no    Last Vitals:  Vitals:   01/08/17 1309 01/08/17 1324  BP: (!) 141/85 (!) 148/90  Pulse: 100 (!) 101  Resp: (!) 21 (!) 8  Temp:      Last Pain:  Vitals:   01/08/17 1325  TempSrc:   PainSc: 4                  Khadejah Son DANIEL

## 2017-01-08 NOTE — Progress Notes (Signed)
Clinical Social Worker [facility] to assist patient in attaining a letter for missing court. Patient stated he had traffic court scheduled for June 28th but he was unable attend due to being hospitalized. CSW gave patient a letter to give to family members to fax to the court system.   Clinical Social Worker will sign off for now as social work intervention is no longer needed. Please consult us again if new need arises.  Marrianne MoodAshley Renezmae Canlas, MSW, [employer]WA [phone]

## 2017-01-08 NOTE — Anesthesia Procedure Notes (Signed)
Procedure Name: LMA Insertion Date/Time: 01/08/2017 11:35 AM Performed by: Rise PatienceBELL, Avalynn Bowe T Pre-anesthesia Checklist: Patient identified, Emergency Drugs available, Suction available and Patient being monitored Patient Re-evaluated:Patient Re-evaluated prior to inductionOxygen Delivery Method: Circle System Utilized Preoxygenation: Pre-oxygenation with 100% oxygen Intubation Type: IV induction LMA: LMA inserted LMA Size: 5.0 Number of attempts: 1 Airway Equipment and Method: Bite block Placement Confirmation: positive ETCO2 Tube secured with: Tape Dental Injury: Teeth and Oropharynx as per pre-operative assessment

## 2017-01-08 NOTE — Anesthesia Preprocedure Evaluation (Addendum)
Anesthesia Evaluation  Patient identified by MRN, date of birth, ID band Patient awake    Reviewed: Allergy & Precautions, H&P , NPO status , Patient's Chart, lab work & pertinent test results  History of Anesthesia Complications Negative for: history of anesthetic complications  Airway Mallampati: IV  TM Distance: >3 FB Neck ROM: Full   Comment: Beard  Dental  (+) Teeth Intact, Dental Advisory Given   Pulmonary Current Smoker,    Pulmonary exam normal breath sounds clear to auscultation       Cardiovascular negative cardio ROS   Rhythm:Regular Rate:Normal  ECG: SR, rate 73   Neuro/Psych PSYCHIATRIC DISORDERS Depression Bipolar Disorder Schizophrenia negative neurological ROS     GI/Hepatic negative GI ROS, Neg liver ROS,   Endo/Other  Morbid obesity  Renal/GU negative Renal ROS  negative genitourinary   Musculoskeletal   Abdominal (+) + obese,   Peds  Hematology negative hematology ROS (+)   Anesthesia Other Findings Obese   Reproductive/Obstetrics negative OB ROS                            Anesthesia Physical  Anesthesia Plan  ASA: II  Anesthesia Plan: General   Post-op Pain Management:    Induction: Intravenous  PONV Risk Score and Plan: 2 and Ondansetron and Dexamethasone  Airway Management Planned: LMA  Additional Equipment:   Intra-op Plan:   Post-operative Plan: Extubation in OR  Informed Consent: I have reviewed the patients History and Physical, chart, labs and discussed the procedure including the risks, benefits and alternatives for the proposed anesthesia with the patient or authorized representative who has indicated his/her understanding and acceptance.   Dental advisory given  Plan Discussed with: CRNA, Anesthesiologist and Surgeon  Anesthesia Plan Comments:        Anesthesia Quick Evaluation

## 2017-01-08 NOTE — Interval H&P Note (Signed)
History and Physical Interval Note:  01/08/2017 10:44 AM  Todd Shaw  has presented today for surgery, with the diagnosis of open leg wound right  The various methods of treatment have been discussed with the patient and family. After consideration of risks, benefits and other options for treatment, the patient has consented to  Procedure(s): SKIN GRAFT SPLIT THICKNESS from right thigh to right leg (Right) possible APPLICATION OF A-CELL (Right) as a surgical intervention .  The patient's history has been reviewed, patient examined, no change in status, stable for surgery.  I have reviewed the patient's chart and labs.  Questions were answered to the patient's satisfaction.     Channing Savich

## 2017-01-08 NOTE — H&P (View-Only) (Signed)
Reason for Consult: LE fasciotomy wound Referring Physician: B. Donzetta Matters MD Location: [facility]- inpatient Date: 6.29.18  Todd Shaw is an 31 y.o. male.  HPI: Admitted 6.23.18 with acute onset pain RLE. Work up including CT angio demonstrated occlusion AT right with reconstitution at ankle. No preceeding trauma. Underwent compartment release, AT muscle noted to not contract. Lateral compartment with contracting muscle. Patient currently unable to dorsiflex. Plastic Surgery consulted for skin graft to leg.  PMH significant for bipolar d/o. PTA 0.5 ppd tobacco. PTA living with family, not currently working. States bipolar controlled and followed at [facility].  Past Medical History:  Diagnosis Date  . Bipolar 1 disorder ([place])   . Hallucinations   . Psychosis     Past Surgical History:  Procedure Laterality Date  . APPLICATION OF WOUND VAC Right 01/01/2017   Procedure: RIGHT LEG WOUND VAC CHANGE with irrigation and DEBRIDEMENT of right leg fasciotomy site;  Surgeon: Rosetta Posner, MD;  Location: [place];  Service: Vascular;  Laterality: Right;  . APPLICATION OF WOUND VAC Right 01/03/2017   Procedure: WOUND VAC CHANGE;  Surgeon: Elam Dutch, MD;  Location: [place];  Service: Vascular;  Laterality: Right;  . FASCIOTOMY Right 12/31/2016   Procedure: FASCIOTOMY, RIGHT LOWER EXTREMITY COMPARTMENT;  Surgeon: Waynetta Sandy, MD;  Location: [place];  Service: Vascular;  Laterality: Right;  . I&D EXTREMITY Right 01/03/2017   Procedure: RIGHT LOWER EXTREMITY FASCIOTOMY WASH-OUT. DEBRIDEMENT;  Surgeon: Elam Dutch, MD;  Location: [facility];  Service: Vascular;  Laterality: Right;    Family History  Problem Relation Age of Onset  . Mental illness Neg Hx     Social History: +tob, occassional marijuana,denies other drug use  Allergies: No Known Allergies  Medications: I have reviewed the patient's current medications.  Results for orders placed or performed during the hospital encounter of 12/30/16  (from the past 48 hour(s))  Basic metabolic panel     Status: Abnormal   Collection Time: 01/05/17  2:03 AM  Result Value Ref Range   Sodium 135 135 - 145 mmol/L   Potassium 3.6 3.5 - 5.1 mmol/L   Chloride 101 101 - 111 mmol/L   CO2 27 22 - 32 mmol/L   Glucose, Bld 262 (H) 65 - 99 mg/dL   BUN <5 (L) 6 - 20 mg/dL   Creatinine, Ser 0.80 0.61 - 1.24 mg/dL   Calcium 8.7 (L) 8.9 - 10.3 mg/dL   GFR calc non Af Amer >60 >60 mL/min   GFR calc Af Amer >60 >60 mL/min    Comment: (NOTE) The eGFR has been calculated using the CKD EPI equation. This calculation has not been validated in all clinical situations. eGFR's persistently <60 mL/min signify possible Chronic Kidney Disease.    Anion gap 7 5 - 15    ROS Blood pressure 126/73, pulse (!) 105, temperature 97.9 F (36.6 C), temperature source Oral, resp. rate 20, height '5\' 7"'$  (1.702 m), weight 99.8 kg (220 lb), SpO2 99 %. Physical Exam  Cardiovascular: Normal rate, regular rhythm and normal heart sounds.   Respiratory: Effort normal and breath sounds normal.   GEN: alert, cooperative RLE: no cellulitis right anterolateral leg with 22 x 9 cm open area with exposure AT muscle which is viable, no granulation tissue present clean +DP in boot  Assessment/Plan: Mother at bedside and both agreeable to surgery. Plan STSG with possible A Cell to donor site. Reviewed purpose graft, risk graft failure and need for prolonged wound care, patch like  appearance, no sensation. Similarly donor site with patch like appearance, appr 10 days for healing, purpose of A Cell to aid pain control and hasten healing donor site, porcine source. Scheduled for 7.2.18 in am. If patient eligbile for home VAC then anticipate d/c to home 7.3.18.  [name], MD [facility] Plastic & Reconstructive Surgery [phone], pin [phone]

## 2017-01-09 ENCOUNTER — Encounter (HOSPITAL_COMMUNITY): Payer: Self-pay | Admitting: Plastic Surgery

## 2017-01-09 MED ORDER — OXYCODONE-ACETAMINOPHEN 5-325 MG PO TABS: 1.0000 | ORAL_TABLET | Freq: Four times a day (QID) | ORAL | 0 refills | Status: DC | PRN

## 2017-01-09 NOTE — Progress Notes (Signed)
Occupational Therapy Treatment Patient Details Name: Todd Shaw MRN: [id number] DOB: 10-01-1985 Today's Date: 01/09/2017    History of present illness Pt is a 31 yo male admitted through ED on 12/30/16 with extreme right leg pain. 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 Pt underwent an fasciotomy with wound vac placement on 01/01/17 and fasciotomy wash-out with wound vac change on 01/03/17. Closure of fasciotomy with skin graft and application of a-cell 09/14/08. PMH significant for Bipolar 1 with hallucinations and psychosis, smoker.    OT comments  Focus of session on educating pt in compensatory strategies and use of AE for LB ADL. Issued AE kit. Educated pt in use of 3 in 1 over toilet. Pt demonstrating or verbalizing understanding of all information.   Follow Up Recommendations  No OT follow up;Supervision - Intermittent    Equipment Recommendations  3 in 1 bedside commode    Recommendations for Other Services      Precautions / Restrictions Precautions Precautions: Other (comment) Precaution Comments: wound vac RLE Required Braces or Orthoses: Other Brace/Splint Other Brace/Splint: Prafo shoe RLE Restrictions Weight Bearing Restrictions: Yes RLE Weight Bearing: Non weight bearing       Mobility Bed Mobility Overal bed mobility: Needs Assistance Bed Mobility: Supine to Sit;Sit to Supine     Supine to sit: Supervision Sit to supine: Supervision   General bed mobility comments: assist for wound vac, increased time  Transfers Overall transfer level: Needs assistance Equipment used: Rolling walker (2 wheeled) Transfers: Sit to/from Stand Sit to Stand: Supervision              Balance     Sitting balance-Leahy Scale: Normal       Standing balance-Leahy Scale: Poor Standing balance comment: requiring bracing LEs on bed or one hand on walker                           ADL  either performed or assessed with clinical judgement   ADL Overall ADL's : Needs assistance/impaired             Lower Body Bathing: Supervison/ safety;Sit to/from stand Lower Body Bathing Details (indicate cue type and reason): issued and educated in use of long bath sponge     Lower Body Dressing: Supervision/safety;Sit to/from stand Lower Body Dressing Details (indicate cue type and reason): issued and educated in use of reacher, sock aid and long shoe horn, instructed to dress R LE first           Tub/Shower Transfer Details (indicate cue type and reason): pt will not shower with wound vac, deferred Functional mobility during ADLs: Supervision/safety;Rolling walker       Vision       Perception     Praxis      Cognition Arousal/Alertness: Awake/alert Behavior During Therapy: WFL for tasks assessed/performed Overall Cognitive Status: Within Functional Limits for tasks assessed                                          Exercises     Shoulder Instructions       General Comments      Pertinent Vitals/ Pain       Pain Assessment: Faces Faces Pain Scale: Hurts little more Pain Location: RLE Pain Descriptors / Indicators: Aching Pain Intervention(s):  Monitored during session;Repositioned  Home Living                                          Prior Functioning/Environment              Frequency  Min 2X/week        Progress Toward Goals  OT Goals(current goals can now be found in the care plan section)  Progress towards OT goals: Progressing toward goals  Acute Rehab OT Goals Patient Stated Goal: to return home with mom who was a CNA in the past OT Goal Formulation: With patient Time For Goal Achievement: 01/21/17 Potential to Achieve Goals: Good  Plan Discharge plan remains appropriate    Co-evaluation                 AM-PAC PT "6 Clicks" Daily Activity     Outcome Measure   Help from another  person eating meals?: None Help from another person taking care of personal grooming?: A Little Help from another person toileting, which includes using toliet, bedpan, or urinal?: A Little Help from another person bathing (including washing, rinsing, drying)?: A Little Help from another person to put on and taking off regular upper body clothing?: None Help from another person to put on and taking off regular lower body clothing?: A Little 6 Click Score: 20    End of Session Equipment Utilized During Treatment: Rolling walker  OT Visit Diagnosis: Other abnormalities of gait and mobility (R26.89);Unsteadiness on feet (R26.81);Pain Pain - Right/Left: Right Pain - part of body: Leg   Activity Tolerance Patient tolerated treatment well   Patient Left in bed;with call bell/phone within reach   Nurse Communication          Time: 7408-1448 OT Time Calculation (min): 16 min  Charges: OT General Charges $OT Visit: 1 Procedure OT Treatments $Self Care/Home Management : 8-22 mins     Malka So 01/09/2017, 9:14 AM  [phone]

## 2017-01-09 NOTE — Care Management Note (Signed)
Case Management Note Donn PieriniKristi Dariya Gainer RN, BSN Unit 2W-Case Manager [phone]  Patient Details  Name: Todd Shaw MRN: [id number] Date of Birth: 02/29/1986  Subjective/Objective:  Pt admitted with compartment syndrome s/p ant./post. Fasciotomy with wound VAC placment                  Action/Plan:  PTA pt lived at home- referral received for PCP needs- CM will follow for this and possible HH needs for ?wound VAC at home- plan for return to OR on 6/27 for further washout and wound VAC change- [name].   Expected Discharge Date:                  Expected Discharge Plan:  Home w Home Health Services  In-House Referral:     Discharge planning Services  CM Consult, Indigent Health Clinic, Follow-up appt scheduled  Post Acute Care Choice:  Durable Medical Equipment, Home Health Choice offered to:  Patient  DME Arranged:  Vac, 3-N-1, Walker rolling DME Agency:  [facility], [facility]  HH Arranged:  NA HH Agency:  NA  Status of Service:  Completed, signed off  If discussed at Long Length of Stay Meetings, dates discussed:    Discharge Disposition: home/self care   Additional Comments:  01/09/17- 1620 - Donn PieriniKristi Ajani Rineer RN, CM- [facility] wound [facility]AC- pt has been approved for home [facility] wound VAC- VAC will be delivered today- they are unable to give timeframe for delivery due to volume of deliveries before the holiday- but aware this CM that VAC will be delivered today sometime this evening. Have notified Selena BattenKim T.- with vascular of VAC approval. - Pt also aware along with bedside RN..   3n1 and RW have already been delivered to room --  Per plastics note pt will not need HH services- VAC to be changed in office- pt given info on clinics for PCP needs [facility] to establish PCP.    01/08/17- 1430- Donn PieriniKristi Benjamine Strout RN, CM- received call from  MD-([facility])- pt will need home wound VAC- per message wound VAC forms have been signed- CM will start approval process for charity [facility] VAC- [name]- she came by and picked up wound VAC forms- will have pt fill out application [facility] and fax form to [name] when completed- CM to continue to follow for d/c needs-  [facility] forms completed by patient- and [name]- pt also states that he will need RW for home- will ask for order prior to discharge.  Darrold SpanWebster, Ravonda Brecheen Hall, RN 01/09/2017, 4:31 PM

## 2017-01-09 NOTE — Progress Notes (Signed)
   Plastic Surgery POD# 1 STSG from right thigh to right leg, application A Cell to donor site  Temp:  [97.4 F (36.3 C)-98 F (36.7 C)] 97.5 F (36.4 C) (07/03 0443) Pulse Rate:  [100-113] 101 (07/03 0443) Resp:  [8-25] 18 (07/03 0443) BP: (131-154)/(64-90) 135/74 (07/03 0443) SpO2:  [95 %-98 %] 97 % (07/03 0443)   Alert, NAD Right thigh VAC in place functioning, donor site dressing dry  [facility] if able to obtain home VAC- CM working on this. F/u arranged with me for 7.9.18- if unable to get VAC then will need 5-7 d in hospital with VAC over STSG.  Discharge wound care: - sponge bathe, keep right thigh and leg dressings dry intact -may reinforce right thigh dressing as needed -keep RLE elevated at heart level while sitting, in bed - VAC to 125 mm Hg Continuous (high intensity), do not turn off or clamp -no VAC changes, will be done by MD on 01/15/17  Glenna FellowsBrinda Nathanyal Ashmead, MD [facility] Plastic & Reconstructive Surgery [phone], pin [phone]

## 2017-01-09 NOTE — Progress Notes (Addendum)
[facility]- pt has been approved for home [facility] wound VAC- VAC will be delivered today- they are unable to give timeframe for delivery due to volume of deliveries before the holiday- but aware this CM that VAC will be delivered today sometime this evening. Have notified Selena BattenKim T.- with vascular of VAC approval. - Pt also aware along with bedside RN..   3n1 and RW have already been delivered to room  Update- [facility] informed CM that delivery should be by 5pm

## 2017-01-09 NOTE — Progress Notes (Signed)
Pt provided with thorough verbal and written discharge instructions and teach-back. Pt and family report understanding discharge instructions and follow-up plan. Pt discharged via wheelchair, pt will be transported home with mother via car. Pt has possession of all personal belongings, discharge instructions, signed prescription, and home equipment (wound vac, BSC, and front wheel walker).  Pt IV removed and vital signs WDL prior to discharge.     01/09/2017 Mila PalmerJohnson,Eylin Pontarelli A, RN

## 2017-01-09 NOTE — Progress Notes (Signed)
Physical Therapy Treatment Patient Details Name: Todd Shaw MRN: [id number] DOB: 1986-04-19 Today's Date: 01/09/2017    History of Present Illness Pt is a 31 yo male admitted through ED on 12/30/16 with extreme right leg pain. 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 Pt underwent an fasciotomy with wound vac placement on 01/01/17 and fasciotomy wash-out with wound vac change on 01/03/17. Closure of fasciotomy with skin graft and application of a-cell 01/08/17. PMH significant for Bipolar 1 with hallucinations and psychosis, smoker.     PT Comments    Pt mobilizing short distances with RW. Limited by pain. Attempted stairs but pt unable to hop up stair. Pt can stay on ground level of home with bathroom and sleep on couch. Also discussed sitting on stairs and scooting up in sitting position.    Follow Up Recommendations  No PT follow up (Could benefit from HHPT but doesn't have coverage)     Equipment Recommendations  Rolling walker with 5" wheels    Recommendations for Other Services       Precautions / Restrictions Precautions Precautions: Other (comment) Precaution Comments: wound vac RLE Required Braces or Orthoses: Other Brace/Splint Other Brace/Splint: Prafo shoe RLE Restrictions RLE Weight Bearing: Non weight bearing    Mobility  Bed Mobility Overal bed mobility: Needs Assistance Bed Mobility: Supine to Sit;Sit to Supine     Supine to sit: Supervision Sit to supine: Supervision   General bed mobility comments: Incr time  Transfers Overall transfer level: Needs assistance Equipment used: Rolling walker (2 wheeled) Transfers: Sit to/from Stand Sit to Stand: Supervision         General transfer comment: Verbal cues for hand placement  Ambulation/Gait Ambulation/Gait assistance: Supervision Ambulation Distance (Feet): 15 Feet Assistive device: Rolling walker (2 wheeled) Gait  Pattern/deviations: Step-to pattern;Trunk flexed (hop to with NWB on rt) Gait velocity: decreased Gait velocity interpretation: Below normal speed for age/gender General Gait Details: Supervision for safety. Pt able to maintain NWB on rt. Distance limited by pain   Stairs            Wheelchair Mobility    Modified Rankin (Stroke Patients Only)       Balance Overall balance assessment: Needs assistance Sitting-balance support: No upper extremity supported;Feet supported Sitting balance-Leahy Scale: Normal     Standing balance support: Bilateral upper extremity supported Standing balance-Leahy Scale: Poor Standing balance comment: walker for static standing                            Cognition Arousal/Alertness: Awake/alert Behavior During Therapy: WFL for tasks assessed/performed Overall Cognitive Status: Within Functional Limits for tasks assessed                                        Exercises      General Comments        Pertinent Vitals/Pain Pain Assessment: 0-10 Pain Score: 9  Faces Pain Scale: Hurts little more Pain Location: RLE Pain Descriptors / Indicators: Aching;Throbbing Pain Intervention(s): Limited activity within patient's tolerance;Monitored during session    Home Living                      Prior Function            PT Goals (current goals can now be  found in the care plan section) Acute Rehab PT Goals Patient Stated Goal: to return home with mom who was a CNA in the past Progress towards PT goals: Progressing toward goals    Frequency    Min 3X/week      PT Plan Current plan remains appropriate    Co-evaluation              AM-PAC PT "6 Clicks" Daily Activity  Outcome Measure  Difficulty turning over in bed (including adjusting bedclothes, sheets and blankets)?: None Difficulty moving from lying on back to sitting on the side of the bed? : None Difficulty sitting down on and  standing up from a chair with arms (e.g., wheelchair, bedside commode, etc,.)?: A Little Help needed moving to and from a bed to chair (including a wheelchair)?: A Little Help needed walking in hospital room?: A Little Help needed climbing 3-5 steps with a railing? : Total 6 Click Score: 18    End of Session   Activity Tolerance: Patient limited by pain Patient left: in bed;with call bell/phone within reach   PT Visit Diagnosis: Other abnormalities of gait and mobility (R26.89);Pain Pain - Right/Left: Right Pain - part of body: Leg     Time: 1610-9604 PT Time Calculation (min) (ACUTE ONLY): 30 min  Charges:  $Gait Training: 23-37 mins                    G Codes:       [facility] PT [phone]    Angelina Ok [facility] 01/09/2017, 12:45 PM

## 2017-01-09 NOTE — Discharge Instructions (Addendum)
Discharge wound care: - sponge bathe, keep right thigh and leg dressings dry intact -may reinforce right thigh dressing as needed -keep RLE elevated at heart level while sitting, in bed - VAC to 125 mm Hg Continuous (high intensity), do not turn off or clamp -no VAC changes, will be done by MD on 01/15/17

## 2017-01-09 NOTE — Progress Notes (Signed)
Pt. Discharged to home with friend Pt. D/C'd via  Wheelchair with home wound vac Discharge information reviewed and given All personal belongings given to Pt.  Education discussed with teach-back  IV was d/c

## 2017-01-09 NOTE — Progress Notes (Addendum)
  Progress Note  SUBJECTIVE:    Right foot a "little numb"  OBJECTIVE:   Vitals:   01/08/17 2214 01/09/17 0443  BP: (!) 154/64 135/74  Pulse: (!) 113 (!) 101  Resp: 18 18  Temp: 97.5 F (36.4 C) 97.5 F (36.4 C)    Intake/Output Summary (Last 24 hours) at 01/09/17 0733 Last data filed at 01/09/17 0707  Gross per 24 hour  Intake             1480 ml  Output             2645 ml  Net            -1165 ml   Wiggling right toes. Not really moving ankle.  Right foot is warm. Dressing clean to right thigh.  VAC dressing to lateral right calf.   ASSESSMENT/PLAN:   31 y.o. male is s/p: layered closure right leg, split thickness skin graft to right leg, A Cell to right thigh 1 Day Post-Op   Appreciate plastic surgery assistance.  Potential d/c today if ok with plastic surgery.  PT eval today  HH for wound vac  Raymond GurneyKimberly A Trinh 01/09/2017 7:33 AM -- LABS:   CBC    Component Value Date/Time   WBC 9.1 01/03/2017 0345   HGB 13.9 01/03/2017 0345   HCT 42.3 01/03/2017 0345   PLT 259 01/03/2017 0345    BMET    Component Value Date/Time   NA 135 01/05/2017 0203   K 3.6 01/05/2017 0203   CL 101 01/05/2017 0203   CO2 27 01/05/2017 0203   GLUCOSE 262 (H) 01/05/2017 0203   BUN <5 (L) 01/05/2017 0203   CREATININE 0.80 01/05/2017 0203   CALCIUM 8.7 (L) 01/05/2017 0203   GFRNONAA >60 01/05/2017 0203   GFRAA >60 01/05/2017 0203    COAG No results found for: INR, PROTIME No results found for: PTT  ANTIBIOTICS:   Anti-infectives    None       Maris BergerKimberly Trinh, PA-C [facility] Office: [phone] Pager: [phone] 01/09/2017 7:33 AM  I have independently interviewed patient and agree with PA assessment and plan above.   Stoney Karczewski C. Randie Heinzain, MD [facility] [place] Office: [phone] Pager: [phone]

## 2017-01-11 NOTE — Discharge Summary (Signed)
[facility] Discharge Summary  Todd Shaw [birth year] 31 y.o. male  [id number]  Admission Date: 12/30/2016  Discharge Date: 01/09/2017  Physician: Lemar LivingsBrandon Cain, MD  Admission Diagnosis: Leg pain [M79.606] Non-traumatic compartment syndrome of right upper extremity [M79.A11] Non-traumatic rhabdomyolysis [M62.82] Compartment syndrome (HCC) [T79.A0XA]  HPI:   This is a 31 y.o. male without significant medical history other than bipolar. On Friday evening (12/29/16), he began having pain in his right leg. He denies having any inciting incident. Had associated difficulty walking yesterday prompting ED visit. Denies any vascular history. Does smoke cigarettes daily. No blood thinners. Last meal was at 1700 on Saturday.  Hospital Course:  The patient was admitted to the hospital and taken to the operating room on 12/31/2016 and underwent right lower extremity anterior and lateral compartment fasciotomies. Intraoperative findings: The right anterior compartment was tight and muscle bulged immediately upon opening the fascia. Muscle had minimal reactivity to electrocautery although was grossly normal appearing. The lateral compartment was also opened muscle was viable and no bulging of the muscle was noted. The patient tolerated the procedure well and was transported to the PACU in stable condition.   The patient had a palpable right DP pulse on postop day 1, however he was unable to move his toes on the right. His CK was extremely elevated. He was taken back to the OR for further evaluation.  He was taken back to there are 01/01/2017 and underwent wound VAC change of open fasciotomy. The anterior most portion of the exposed muscle was pink but did not contract with electrocautery. The posterior half of the anterior compartment did contract with electrocautery. All of this muscle appeared to be viable. Tolerated the procedure well and was transferred to the recovery room in stable  condition.  His CK did improve on 01/02/2017. He was taken back to the operating room on 01/03/2017 underwent irrigation of his wound and VAC change. The anterior tibialis muscle had no contractility. The lateral muscles to contract. The proximal and distal skin edges were then approximated with staples about 2 cm from each end. VAC dressing was replaced. The patient tolerated the procedure well and was transported to the recovery room in stable condition.  The patient was minimally able to dorsi flex his right ankle. He had a palpable right DP/PT.  Plastic surgery was asked to see regarding skin graft. He was taken to the operating room on 01/08/2017 with plastic surgery (Dr. Leta Baptisthimmappa) and underwent layered closure right leg 6 cm, split thickness skin graft from right thigh to right leg and application of A Cell to right thigh donor site.  The patient did well postop. A home wound VAC was obtained. He will follow-up with plastic surgery on 01/15/2017 for evaluation. He was discharged home on 02/05/2017 in good condition.   CBC    Component Value Date/Time   WBC 9.1 01/03/2017 0345   RBC 4.57 01/03/2017 0345   HGB 13.9 01/03/2017 0345   HCT 42.3 01/03/2017 0345   PLT 259 01/03/2017 0345   MCV 92.6 01/03/2017 0345   MCH 30.4 01/03/2017 0345   MCHC 32.9 01/03/2017 0345   RDW 13.8 01/03/2017 0345   LYMPHSABS 1.9 01/01/2017 0353   MONOABS 1.6 (H) 01/01/2017 0353   EOSABS 0.0 01/01/2017 0353   BASOSABS 0.0 01/01/2017 0353    BMET    Component Value Date/Time   NA 135 01/05/2017 0203   K 3.6 01/05/2017 0203   CL 101 01/05/2017 0203   CO2 27  01/05/2017 0203   GLUCOSE 262 (H) 01/05/2017 0203   BUN <5 (L) 01/05/2017 0203   CREATININE 0.80 01/05/2017 0203   CALCIUM 8.7 (L) 01/05/2017 0203   GFRNONAA >60 01/05/2017 0203   GFRAA >60 01/05/2017 0203     Discharge Instructions:   The patient is discharged to home with extensive instructions on wound care and progressive ambulation.   They are instructed not to drive or perform any heavy lifting until returning to see the physician in his office.  Discharge Instructions    Call MD for:  redness, tenderness, or signs of infection (pain, swelling, bleeding, redness, odor or green/yellow discharge around incision site)    Complete by:  As directed    Call MD for:  severe or increased pain, loss or decreased feeling  in affected limb(s)    Complete by:  As directed    Call MD for:  temperature >100.5    Complete by:  As directed    Discharge wound care:    Complete by:  As directed    - sponge bathe, keep right thigh and leg dressings dry intact -may reinforce right thigh dressing as needed -keep RLE elevated at heart level while sitting, in bed - VAC to 125 mm Hg Continuous (high intensity), do not turn off or clamp -no VAC changes, will be done by MD on 01/15/17   Driving Restrictions    Complete by:  As directed    No driving for 4 weeks   Increase activity slowly    Complete by:  As directed    Walk with assistance use walker as needed   Resume previous diet    Complete by:  As directed       Discharge Diagnosis:  Leg pain [M79.606] Non-traumatic compartment syndrome of right upper extremity [M79.A11] Non-traumatic rhabdomyolysis [M62.82] Compartment syndrome (HCC) [T79.A0XA]  Secondary Diagnosis: Patient Active Problem List   Diagnosis Date Noted  . Compartment syndrome (HCC) 12/31/2016  . Cannabis use disorder, moderate, dependence (HCC) 11/11/2015  . Major depressive disorder, recurrent episode, severe, with psychotic behavior (HCC) 09/21/2015  . Attention deficit hyperactivity disorder (ADHD) 09/13/2015  . Tobacco use disorder 09/13/2015   Past Medical History:  Diagnosis Date  . Bipolar 1 disorder (HCC)   . Hallucinations   . Psychosis      Allergies as of 01/09/2017      Reactions   No Known Allergies       Medication List    TAKE these medications   ARIPiprazole 10 MG tablet Commonly  known as:  ABILIFY Take 10 mg by mouth daily.   benztropine 0.5 MG tablet Commonly known as:  COGENTIN Take 1 tablet (0.5 mg total) by mouth 2 (two) times daily.   BuPROPion HCl ER (XL) 450 MG Tb24 Take 450 mg by mouth daily.   haloperidol 10 MG tablet Commonly known as:  HALDOL Take 1 tablet (10 mg total) by mouth at bedtime.   haloperidol 5 MG tablet Commonly known as:  HALDOL Take 1 tablet (5 mg total) by mouth daily with breakfast.   hydrOXYzine 50 MG tablet Commonly known as:  ATARAX/VISTARIL Take 1 tablet (50 mg total) by mouth 3 (three) times daily.   oxyCODONE-acetaminophen 5-325 MG tablet Commonly known as:  PERCOCET/ROXICET Take 1-2 tablets by mouth every 6 (six) hours as needed.   traZODone 100 MG tablet Commonly known as:  DESYREL Take 1 tablet (100 mg total) by mouth at bedtime.       Percocet #  30 No Refill  Disposition: Home  Patient's condition: is Good  Follow up: 1. Dr. Leta Baptist on 01/15/17  2. Dr. Randie Heinz in 4 weeks    Maris Berger, PA-C [facility] [phone] 01/11/2017  12:00 PM

## 2017-01-12 ENCOUNTER — Telehealth: Payer: Self-pay | Admitting: Vascular Surgery

## 2017-01-12 NOTE — Telephone Encounter (Signed)
Sched appt 02/02/17 at 2:45. Lm on hm#.

## 2017-01-12 NOTE — Telephone Encounter (Signed)
-----   Message from Phillips Odorarol S Pullins, RN sent at 01/11/2017  9:59 AM EDT ----- Regarding: needs 3-4 week f/u with Dr. Randie Heinzain   ----- Message ----- From: Raymond Gurneyrinh, Kimberly A, PA-C Sent: 01/09/2017   6:15 PM To: [facility] Charge Pool  S/p right lower extremity fasciotomy 12/31/16  F/u with Dr. Randie Heinzain in 3-4 weeks  Thanks Selena BattenKim

## 2017-01-25 ENCOUNTER — Ambulatory Visit: Payer: Self-pay | Admitting: Family Medicine

## 2017-01-26 ENCOUNTER — Encounter: Payer: Self-pay | Admitting: Vascular Surgery

## 2017-02-02 ENCOUNTER — Encounter: Payer: Self-pay | Admitting: Vascular Surgery

## 2017-02-02 ENCOUNTER — Ambulatory Visit (INDEPENDENT_AMBULATORY_CARE_PROVIDER_SITE_OTHER): Payer: Self-pay | Admitting: Vascular Surgery

## 2017-02-02 VITALS — BP 113/79 | HR 111 | Temp 99.4°F | Resp 20 | Ht 67.0 in | Wt 226.0 lb

## 2017-02-02 DIAGNOSIS — Z9889 Other specified postprocedural states: Secondary | ICD-10-CM

## 2017-02-02 NOTE — Progress Notes (Signed)
Patient name: Iven Finnerry [place] MRN: [id number] DOB: 1986/01/01 Sex: male  REASON FOR VISIT: post-op  HPI: Iven Finnerry Roehm is a 31 y.o. male without significant medical history other than bipolar. On the evening of 12/29/16, he began having pain in his right leg. He denies having any inciting incident. Had associated difficulty walking yesterday prompting ED visit. Denies any vascular history. Does smoke cigarettes daily. No blood thinners.  He was taken to the OR and underwent right lower extremity anterior and lateral compartment fasciotomies. He had a palpable DP pulse postoperatively but was unable to move his toes on the right. He went back for exploration and irrigation of his wounds on 6/25 and 6/26.   Plastic surgery was asked to see regarding skin graft. He was taken to the operating room on 01/08/2017 with Dr. Leta Baptisthimmappa and underwent layered closure right leg 6 cm, split thickness skin graft from right thigh to right leg and application of A Cell to right thigh donor site.  He has been seeing Dr. Helen Hashimotohimmapa postoperatively and yesterday was his last visit. He states that his numbness in better in the right foot but he is still unable to dorsiflex and plantarflex his right foot. He is able to walk. He denies any pain in his right ankle.   Current Outpatient Prescriptions  Medication Sig Dispense Refill  . ARIPiprazole (ABILIFY) 10 MG tablet Take 10 mg by mouth daily.    . benztropine (COGENTIN) 0.5 MG tablet Take 1 tablet (0.5 mg total) by mouth 2 (two) times daily. (Patient not taking: Reported on 02/02/2017) 60 tablet 0  . buPROPion 450 MG TB24 Take 450 mg by mouth daily. (Patient not taking: Reported on 12/31/2016) 30 tablet 0  . haloperidol (HALDOL) 10 MG tablet Take 1 tablet (10 mg total) by mouth at bedtime. (Patient not taking: Reported on 12/31/2016) 30 tablet 0  . haloperidol (HALDOL) 5 MG tablet Take 1 tablet (5 mg total) by mouth daily with breakfast. (Patient not taking: Reported on  12/31/2016) 30 tablet 0  . hydrOXYzine (ATARAX/VISTARIL) 50 MG tablet Take 1 tablet (50 mg total) by mouth 3 (three) times daily. (Patient not taking: Reported on 12/31/2016) 30 tablet 0  . oxyCODONE-acetaminophen (PERCOCET/ROXICET) 5-325 MG tablet Take 1-2 tablets by mouth every 6 (six) hours as needed. (Patient not taking: Reported on 02/02/2017) 30 tablet 0  . traZODone (DESYREL) 100 MG tablet Take 1 tablet (100 mg total) by mouth at bedtime. (Patient not taking: Reported on 12/31/2016) 30 tablet 0   No current facility-administered medications for this visit.     REVIEW OF SYSTEMS:  [X]  denotes positive finding, [ ]  denotes negative finding Cardiac  Comments:  Chest pain or chest pressure:    Shortness of breath upon exertion:    Short of breath when lying flat:    Irregular heart rhythm:    Constitutional    Fever or chills:      PHYSICAL EXAM: Vitals:   02/02/17 1454  BP: 113/79  Pulse: (!) 111  Resp: 20  Temp: 99.4 F (37.4 C)  TempSrc: Oral  SpO2: 96%  Weight: 226 lb (102.5 kg)  Height: 5\' 7"  (1.702 m)    GENERAL: The patient is a well-nourished male, in no acute distress. The vital signs are documented above. VASCULAR: Palpable right DP. Right thigh donor site healing well. Right lateral calf wound healed.  NEURO: unable to dorsiflex or plantarflex right ankle. Wiggles right toe.   MEDICAL ISSUES: S/p right lower extremity fasciotomy and skin  graft   The patients wounds are healed. Has normal perfusion to right foot. Numbness to right foot significantly improved. Last visit with plastic surgery was yesterday. Still unable to dorsiflex or plantarflex right ankle. Will refer to outpatient physical therapy. Follow-up prn.   Maris BergerKimberly Jafar Poffenberger, PA-C [facility]  Clinic MD: Randie Heinzain

## 2017-03-05 ENCOUNTER — Ambulatory Visit: Payer: No Typology Code available for payment source | Attending: Vascular Surgery | Admitting: Rehabilitation

## 2018-08-24 ENCOUNTER — Emergency Department (HOSPITAL_COMMUNITY): Payer: Self-pay

## 2018-08-24 ENCOUNTER — Emergency Department (HOSPITAL_COMMUNITY)
Admission: EM | Admit: 2018-08-24 | Discharge: 2018-08-25 | Disposition: A | Discharge status: Other Acute Inpatient Hospital | Payer: Self-pay | Attending: Emergency Medicine | Admitting: Emergency Medicine

## 2018-08-24 ENCOUNTER — Encounter (HOSPITAL_COMMUNITY): Payer: Self-pay | Admitting: Nurse Practitioner

## 2018-08-24 DIAGNOSIS — Z9119 Patient's noncompliance with other medical treatment and regimen: Secondary | ICD-10-CM | POA: Insufficient documentation

## 2018-08-24 DIAGNOSIS — F312 Bipolar disorder, current episode manic severe with psychotic features: Secondary | ICD-10-CM | POA: Insufficient documentation

## 2018-08-24 DIAGNOSIS — F1220 Cannabis dependence, uncomplicated: Secondary | ICD-10-CM | POA: Insufficient documentation

## 2018-08-24 DIAGNOSIS — S0990XA Unspecified injury of head, initial encounter: Secondary | ICD-10-CM | POA: Insufficient documentation

## 2018-08-24 DIAGNOSIS — R443 Hallucinations, unspecified: Secondary | ICD-10-CM

## 2018-08-24 DIAGNOSIS — F3010 Manic episode without psychotic symptoms, unspecified: Secondary | ICD-10-CM

## 2018-08-24 DIAGNOSIS — W228XXA Striking against or struck by other objects, initial encounter: Secondary | ICD-10-CM | POA: Insufficient documentation

## 2018-08-24 DIAGNOSIS — Y999 Unspecified external cause status: Secondary | ICD-10-CM | POA: Insufficient documentation

## 2018-08-24 DIAGNOSIS — F22 Delusional disorders: Secondary | ICD-10-CM

## 2018-08-24 DIAGNOSIS — Y92810 Car as the place of occurrence of the external cause: Secondary | ICD-10-CM | POA: Insufficient documentation

## 2018-08-24 DIAGNOSIS — F17210 Nicotine dependence, cigarettes, uncomplicated: Secondary | ICD-10-CM | POA: Insufficient documentation

## 2018-08-24 DIAGNOSIS — Y9389 Activity, other specified: Secondary | ICD-10-CM | POA: Insufficient documentation

## 2018-08-24 LAB — CBC WITH DIFFERENTIAL/PLATELET
ABS IMMATURE GRANULOCYTES: 0.02 10*3/uL (ref 0.00–0.07)
BASOS PCT: 0 %
Basophils Absolute: 0 10*3/uL (ref 0.0–0.1)
Eosinophils Absolute: 0.2 10*3/uL (ref 0.0–0.5)
Eosinophils Relative: 2 %
HCT: 40.6 % (ref 39.0–52.0)
Hemoglobin: 13.5 g/dL (ref 13.0–17.0)
IMMATURE GRANULOCYTES: 0 %
Lymphocytes Relative: 14 %
Lymphs Abs: 1.1 10*3/uL (ref 0.7–4.0)
MCH: 30.6 pg (ref 26.0–34.0)
MCHC: 33.3 g/dL (ref 30.0–36.0)
MCV: 92.1 fL (ref 80.0–100.0)
Monocytes Absolute: 0.8 10*3/uL (ref 0.1–1.0)
Monocytes Relative: 11 %
NEUTROS ABS: 5.5 10*3/uL (ref 1.7–7.7)
NEUTROS PCT: 73 %
Platelets: 240 10*3/uL (ref 150–400)
RBC: 4.41 MIL/uL (ref 4.22–5.81)
RDW: 12.9 % (ref 11.5–15.5)
WBC: 7.6 10*3/uL (ref 4.0–10.5)
nRBC: 0 % (ref 0.0–0.2)

## 2018-08-24 LAB — COMPREHENSIVE METABOLIC PANEL
ALBUMIN: 4.4 g/dL (ref 3.5–5.0)
ALK PHOS: 55 U/L (ref 38–126)
ALT: 28 U/L (ref 0–44)
AST: 53 U/L — AB (ref 15–41)
Anion gap: 11 (ref 5–15)
BILIRUBIN TOTAL: 1.3 mg/dL — AB (ref 0.3–1.2)
BUN: 9 mg/dL (ref 6–20)
CALCIUM: 9.1 mg/dL (ref 8.9–10.3)
CO2: 23 mmol/L (ref 22–32)
Chloride: 105 mmol/L (ref 98–111)
Creatinine, Ser: 1.07 mg/dL (ref 0.61–1.24)
GFR calc Af Amer: >60 mL/min (ref >60)
GLUCOSE: 73 mg/dL (ref 70–99)
Potassium: 3.7 mmol/L (ref 3.5–5.1)
Sodium: 139 mmol/L (ref 135–145)
TOTAL PROTEIN: 6.8 g/dL (ref 6.5–8.1)

## 2018-08-24 LAB — RAPID URINE DRUG SCREEN, HOSP PERFORMED
Amphetamines: NOT DETECTED
BARBITURATES: NOT DETECTED
Benzodiazepines: POSITIVE — AB
Cocaine: NOT DETECTED
Opiates: NOT DETECTED
Tetrahydrocannabinol: POSITIVE — AB

## 2018-08-24 LAB — ACETAMINOPHEN LEVEL

## 2018-08-24 LAB — ETHANOL

## 2018-08-24 LAB — SALICYLATE LEVEL

## 2018-08-24 NOTE — ED Notes (Signed)
Bed: WBH42 Expected date:  Expected time:  Means of arrival:  Comments: 

## 2018-08-24 NOTE — BH Assessment (Signed)
[facility] Assessment Progress Note   Pt had been administered haldol and versed IM earlier.  Pt still sedated and unable to be assessed at this time.

## 2018-08-24 NOTE — ED Notes (Signed)
Mom reports that patient has been off of his medications for 1 year.

## 2018-08-24 NOTE — ED Triage Notes (Signed)
Pt is presented by medics and [employer], reported manic/pyschosis/hallucinations on scene, he is seemingly calm but borderline agitated. He received 5 mg Haldol and 5 mg Versed both IM from medics. Unable to complete a complete pysch/suicide assessment at this time.
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
## 2018-08-25 ENCOUNTER — Other Ambulatory Visit: Payer: Self-pay

## 2018-08-25 ENCOUNTER — Inpatient Hospital Stay (HOSPITAL_COMMUNITY)
Admission: AD | Admit: 2018-08-25 | Discharge: 2018-08-27 | DRG: 885 | Disposition: A | Discharge status: Home / Self Care | Payer: Federal, State, Local not specified - Other | Attending: Psychiatry | Admitting: Psychiatry

## 2018-08-25 ENCOUNTER — Encounter (HOSPITAL_COMMUNITY): Payer: Self-pay

## 2018-08-25 DIAGNOSIS — Z9114 Patient's other noncompliance with medication regimen: Secondary | ICD-10-CM | POA: Diagnosis not present

## 2018-08-25 DIAGNOSIS — Z79899 Other long term (current) drug therapy: Secondary | ICD-10-CM

## 2018-08-25 DIAGNOSIS — F17210 Nicotine dependence, cigarettes, uncomplicated: Secondary | ICD-10-CM | POA: Diagnosis present

## 2018-08-25 DIAGNOSIS — F312 Bipolar disorder, current episode manic severe with psychotic features: Principal | ICD-10-CM | POA: Diagnosis present

## 2018-08-25 MED ORDER — HALOPERIDOL 5 MG PO TABS
5.0000 mg | ORAL_TABLET | Freq: Every day | ORAL | Status: DC
  Administered 2018-08-25: 5 mg via ORAL
  Filled 2018-08-25: qty 1

## 2018-08-25 MED ORDER — TRAZODONE HCL 100 MG PO TABS
100.0000 mg | ORAL_TABLET | Freq: Every day | ORAL | Status: DC
  Administered 2018-08-25 – 2018-08-26 (×2): 100 mg via ORAL
  Filled 2018-08-25 (×5): qty 1

## 2018-08-25 MED ORDER — HYDROXYZINE HCL 25 MG PO TABS: 25.0000 mg | ORAL_TABLET | Freq: Three times a day (TID) | ORAL | Status: DC | PRN

## 2018-08-25 MED ORDER — HALOPERIDOL 5 MG PO TABS
5.0000 mg | ORAL_TABLET | Freq: Every day | ORAL | Status: DC
  Administered 2018-08-26: 5 mg via ORAL
  Filled 2018-08-25 (×3): qty 1

## 2018-08-25 MED ORDER — BUPROPION HCL ER (XL) 150 MG PO TB24
450.0000 mg | ORAL_TABLET | Freq: Every day | ORAL | Status: DC
  Administered 2018-08-25: 450 mg via ORAL
  Filled 2018-08-25: qty 3

## 2018-08-25 MED ORDER — BENZTROPINE MESYLATE 0.5 MG PO TABS
0.5000 mg | ORAL_TABLET | Freq: Two times a day (BID) | ORAL | Status: DC
  Administered 2018-08-25: 0.5 mg via ORAL
  Filled 2018-08-25: qty 1

## 2018-08-25 MED ORDER — BENZTROPINE MESYLATE 0.5 MG PO TABS
0.5000 mg | ORAL_TABLET | Freq: Two times a day (BID) | ORAL | Status: DC
  Administered 2018-08-25 – 2018-08-27 (×4): 0.5 mg via ORAL
  Filled 2018-08-25 (×8): qty 1

## 2018-08-25 MED ORDER — TRAZODONE HCL 50 MG PO TABS
50.0000 mg | ORAL_TABLET | Freq: Every evening | ORAL | Status: DC | PRN
  Filled 2018-08-25: qty 1

## 2018-08-25 MED ORDER — ACETAMINOPHEN 325 MG PO TABS: 650.0000 mg | ORAL_TABLET | Freq: Four times a day (QID) | ORAL | Status: DC | PRN

## 2018-08-25 MED ORDER — HALOPERIDOL 5 MG PO TABS
10.0000 mg | ORAL_TABLET | Freq: Every day | ORAL | Status: DC
  Administered 2018-08-25: 10 mg via ORAL
  Filled 2018-08-25 (×3): qty 2

## 2018-08-25 MED ORDER — MAGNESIUM HYDROXIDE 400 MG/5ML PO SUSP: 30.0000 mL | Freq: Every day | ORAL | Status: DC | PRN

## 2018-08-25 MED ORDER — TRAZODONE HCL 100 MG PO TABS: 100.0000 mg | ORAL_TABLET | Freq: Every day | ORAL | Status: DC

## 2018-08-25 MED ORDER — HALOPERIDOL 5 MG PO TABS: 10.0000 mg | ORAL_TABLET | Freq: Every day | ORAL | Status: DC

## 2018-08-25 MED ORDER — ALUM & MAG HYDROXIDE-SIMETH 200-200-20 MG/5ML PO SUSP: 30.0000 mL | ORAL | Status: DC | PRN

## 2018-08-25 MED ORDER — BUPROPION HCL ER (XL) 300 MG PO TB24
450.0000 mg | ORAL_TABLET | Freq: Every day | ORAL | Status: DC
  Administered 2018-08-26 – 2018-08-27 (×2): 450 mg via ORAL
  Filled 2018-08-25 (×4): qty 1

## 2018-08-25 NOTE — BH Assessment (Signed)
Per Todd Shaw, pt is accepted to [facility] 505-1 after 5 pm and will be transported by law enforcement. Please call report to [phone].

## 2018-08-25 NOTE — ED Notes (Signed)
Patient at nurse's station questioning how long it would be before he is discharged.  Patient states, "I have a job.  I have to go.  I have things to do." Patient calm and cooperative.  Patient interaction with staff has been appropriate during this shift.  Patient affect is bright.

## 2018-08-25 NOTE — Tx Team (Signed)
Initial Treatment Plan 08/25/2018 8:04 PM Iven Finn LFY:[id number]    PATIENT STRESSORS: Financial difficulties   PATIENT STRENGTHS: Average or above average intelligence General fund of knowledge Physical Health   PATIENT IDENTIFIED PROBLEMS: 1. Be "Not so judgmental"  2. "Getting on the right meds"                   DISCHARGE CRITERIA:  Improved stabilization in mood, thinking, and/or behavior Reduction of life-threatening or endangering symptoms to within safe limits  PRELIMINARY DISCHARGE PLAN: Outpatient therapy  PATIENT/FAMILY INVOLVEMENT: This treatment plan has been presented to and reviewed with the patient, Graiden Claussen.  The patient has been given the opportunity to ask questions and make suggestions.  Kirstie Mirza, RN 08/25/2018, 8:04 PM

## 2018-08-25 NOTE — BH Assessment (Signed)
[facility] Assessment Progress Note   Pt too sedated still to be assessed at this time.  TTS to assess when pt alert and oriented.

## 2018-08-25 NOTE — ED Notes (Signed)
Patient coming up to nurse's station several times this morning saying he needed to go home due to work commitments.  Patient appears anxious but is cooperative and pleasant.  . checks and and video monitoring in place.

## 2018-08-25 NOTE — ED Notes (Addendum)
Report called to Jonny Ruiz RN at behavioral health.  Police called for transport.
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
## 2018-08-25 NOTE — Progress Notes (Signed)
Patient ID: Todd Shaw, male   DOB: Mar 16, 1986, 33 y.o.   MRN: [id number] D: Patient arrived to [facility] Adult unit under IVC from [facility] He was attempting to jump out of a window, hitting his head against a window, and believed that [place] told him that he sold his birthright. He is manic on presentation, with grandiosity. He states he feels paranoid and that he has been on a bad trip from marijuana use. He smokes 3-4 blunts daily. During the admission, he presented under control, stating he did not remember anything that brought him in. It was apparent that he was masking some of his underlying psychosisl. He has some difficulty following directions when performing the skin check and assessment questions. There was some thought blocking. UDS+ benzos and THC, however, he only reported marijuana use. He also reported poor sleep recently. Alcohol was negative. A: Skin assessment performed per protocol, no contraband noted, and revealed tattoos to right and left forearms, blisters on bottom of right foot, and three scars to forehead. Patient belongings were recorded and locked in a locker. Unit orientation completed. Care plan and unit routines reviewed with patient, understanding verbalized. Emotional support offered to patient. Encouraged patient to voice concerns. Fluids offered to patient. Q15 minute checks initiated for safety on and off unit.  R: Patient appropriate on the unit. Eating in dayroom.

## 2018-08-25 NOTE — Progress Notes (Signed)
Recently arrived to [facility] 

## 2018-08-26 DIAGNOSIS — F312 Bipolar disorder, current episode manic severe with psychotic features: Principal | ICD-10-CM

## 2018-08-26 MED ORDER — HALOPERIDOL 5 MG PO TABS
15.0000 mg | ORAL_TABLET | Freq: Every day | ORAL | Status: DC
  Administered 2018-08-26: 15 mg via ORAL
  Filled 2018-08-26 (×3): qty 3

## 2018-08-26 MED ORDER — HALOPERIDOL 5 MG PO TABS
5.0000 mg | ORAL_TABLET | Freq: Every day | ORAL | Status: DC
  Administered 2018-08-27: 5 mg via ORAL
  Filled 2018-08-26 (×2): qty 1

## 2018-08-26 NOTE — Plan of Care (Signed)
Problem: Activity: Goal: Interest or engagement in activities will improve Outcome: Progressing   Problem: Safety: Goal: Periods of time without injury will increase Outcome: Progressing   Problem: Nutritional: Goal: Ability to achieve adequate nutritional intake will improve Outcome: Progressing DAR Note: Pt visible in hall on initial approach. A & O X3. Denies SI, HI, AVH and pain when assessed. Animated but anxious / restless on interactions. Intrusive but is verbally redirectable. Per pt "I'm feeling better right now, the medicine y'all giving me helps me a lot; I just want to go home now, I have responsibilities, I need to go back to work". Observed in scheduled groups and was engaged in activities. Compliant with medications when offered. Denies side effects. Support, encouragement and reassurance provided to pt throughout this shift. Scheduled medications offered with verbal education and effects monitored. Q 15 minutes safety checks continues without self harm gestures or outburst to note. POC continues for safety and mood stability.  Pt tolerates all PO intake well.

## 2018-08-26 NOTE — Tx Team (Addendum)
Interdisciplinary Treatment and Diagnostic Plan Update  08/28/2018 Time of Session: 0905 Todd Shaw MRN: [id number]  Principal Diagnosis: <principal problem not specified>  Secondary Diagnoses: Active Problems:   Severe manic bipolar 1 disorder with psychotic behavior ([place])   Current Medications:  No current facility-administered medications for this encounter.    Current Outpatient Medications  Medication Sig Dispense Refill  . benztropine (COGENTIN) 0.5 MG tablet Take 1 tablet (0.5 mg total) by mouth 2 (two) times daily. 60 tablet 2  . haloperidol (HALDOL) 10 MG tablet Take 2 tablets (20 mg total) by mouth at bedtime. 60 tablet 2  . traZODone (DESYREL) 50 MG tablet Take 1 tablet (50 mg total) by mouth at bedtime as needed for sleep. 30 tablet 1   PTA Medications: No medications prior to admission.    Patient Stressors: Financial difficulties  Patient Strengths: Average or above average intelligence General fund of knowledge Physical Health  Treatment Modalities: Medication Management, Group therapy, Case management,  1 to 1 session with clinician, Psychoeducation, Recreational therapy.   Physician Treatment Plan for Primary Diagnosis: <principal problem not specified> Long Term Goal(s): Improvement in symptoms so as ready for discharge Improvement in symptoms so as ready for discharge   Short Term Goals: Compliance with prescribed medications will improve Ability to identify triggers associated with substance abuse/mental health issues will improve Ability to disclose and discuss suicidal ideas Ability to demonstrate self-control will improve  Medication Management: Evaluate patient's response, side effects, and tolerance of medication regimen.  Therapeutic Interventions: 1 to 1 sessions, Unit Group sessions and Medication administration.  Evaluation of Outcomes: Not Met  Physician Treatment Plan for Secondary Diagnosis: Active Problems:   Severe manic bipolar 1  disorder with psychotic behavior ([place])  Long Term Goal(s): Improvement in symptoms so as ready for discharge Improvement in symptoms so as ready for discharge   Short Term Goals: Compliance with prescribed medications will improve Ability to identify triggers associated with substance abuse/mental health issues will improve Ability to disclose and discuss suicidal ideas Ability to demonstrate self-control will improve     Medication Management: Evaluate patient's response, side effects, and tolerance of medication regimen.  Therapeutic Interventions: 1 to 1 sessions, Unit Group sessions and Medication administration.  Evaluation of Outcomes: Not Met   RN Treatment Plan for Primary Diagnosis: <principal problem not specified> Long Term Goal(s): Knowledge of disease and therapeutic regimen to maintain health will improve  Short Term Goals: Ability to identify and develop effective coping behaviors will improve and Compliance with prescribed medications will improve  Medication Management: RN will administer medications as ordered by provider, will assess and evaluate patient's response and provide education to patient for prescribed medication. RN will report any adverse and/or side effects to prescribing provider.  Therapeutic Interventions: 1 on 1 counseling sessions, Psychoeducation, Medication administration, Evaluate responses to treatment, Monitor vital signs and CBGs as ordered, Perform/monitor CIWA, COWS, AIMS and Fall Risk screenings as ordered, Perform wound care treatments as ordered.  Evaluation of Outcomes: Not Met   LCSW Treatment Plan for Primary Diagnosis: <principal problem not specified> Long Term Goal(s): Safe transition to appropriate next level of care at discharge, Engage patient in therapeutic group addressing interpersonal concerns.  Short Term Goals: Engage patient in aftercare planning with referrals and resources, Increase social support and Increase skills  for wellness and recovery  Therapeutic Interventions: Assess for all discharge needs, 1 to 1 time with Social worker, Explore available resources and support systems, Assess for adequacy in community support network,  Educate family and significant other(s) on suicide prevention, Complete Psychosocial Assessment, Interpersonal group therapy.  Evaluation of Outcomes: Not Met   Progress in Treatment: Attending groups: Yes. Participating in groups: Yes. Taking medication as prescribed: Yes. Toleration medication: Yes. Family/Significant other contact made: No, will contact:  mother Patient understands diagnosis: No. Discussing patient identified problems/goals with staff: Yes. Medical problems stabilized or resolved: Yes. Denies suicidal/homicidal ideation: Yes. Issues/concerns per patient self-inventory: No. Other: none  New problem(s) identified: No, Describe:  none  New Short Term/Long Term Goal(s):  Patient Goals: "go home"   Discharge Plan or Barriers:   Reason for Continuation of Hospitalization: Delusions  Mania Medication stabilization  Estimated Length of Stay: 2-4 days.  Attendees: Patient:Todd Shaw 08/26/2018   Physician: Dr Jake Samples, MD 08/26/2018   Nursing: Keane Police, RN 08/26/2018   RN Care Manager: 08/26/2018   Social Worker: Lurline Idol, LCSW 08/26/2018  Recreational Therapist:  08/26/2018   Other:  08/26/2018   Other:  08/26/2018   Other: 08/26/2018        Scribe for Treatment Team: Joanne Chars, LCSW 08/28/2018 11:54 AM

## 2018-08-26 NOTE — Progress Notes (Signed)
[facility] MD Progress Note  08/26/2018 11:23 AM Todd Shaw  MRN:  [id number] Subjective:    Patient generally intrusive and hypomanic, but is compliant with medications focused on discharge. Involuntary movements but somewhat intrusive/fails to appreciate is under petition for involuntary commitment no recall of previous expressed delusional material that [name] had been speaking to him and so forth he again is a daily cannabis user which is clearly fueling his pathology.  Some benzodiazepine use as well. No EPS or TD  Principal Problem:  Diagnosis: Active Problems:   Severe manic bipolar 1 disorder with psychotic behavior ([facility])  Total Time spent with patient: 20 minutes  Past Medical History:  Past Medical History:  Diagnosis Date  . Bipolar 1 disorder (HCC)   . Hallucinations   . Psychosis [facility])     Past Surgical History:  Procedure Laterality Date  . APPLICATION OF A-CELL OF EXTREMITY Right 01/08/2017   Procedure: APPLICATION OF A-CELL RIGHT LOWER LEG;  Surgeon: Glenna Fellows, MD;  Location: [facility] OR;  Service: Plastics;  Laterality: Right;  . APPLICATION OF WOUND VAC Right 01/01/2017   Procedure: RIGHT LEG WOUND VAC CHANGE with irrigation and DEBRIDEMENT of right leg fasciotomy site;  Surgeon: Larina Earthly, MD;  Location: [facility] OR;  Service: Vascular;  Laterality: Right;  . APPLICATION OF WOUND VAC Right 01/03/2017   Procedure: WOUND VAC CHANGE;  Surgeon: Sherren Kerns, MD;  Location: [facility] OR;  Service: Vascular;  Laterality: Right;  . FASCIOTOMY Right 12/31/2016   Procedure: FASCIOTOMY, RIGHT LOWER EXTREMITY COMPARTMENT;  Surgeon: Maeola Harman, MD;  Location: [facility] OR;  Service: Vascular;  Laterality: Right;  . I&D EXTREMITY Right 01/03/2017   Procedure: RIGHT LOWER EXTREMITY FASCIOTOMY WASH-OUT. DEBRIDEMENT;  Surgeon: Sherren Kerns, MD;  Location: [facility] OR;  Service: Vascular;  Laterality: Right;  . SKIN SPLIT GRAFT Right 01/08/2017   Procedure: SKIN GRAFT SPLIT THICKNESS from  right thigh to right leg;  Surgeon: Glenna Fellows, MD;  Location: [facility] OR;  Service: Plastics;  Laterality: Right;   Family History:  Family History  Problem Relation Age of Onset  . Mental illness Neg Hx     Social History:  Social History   Substance and Sexual Activity  Alcohol Use No     Social History   Substance and Sexual Activity  Drug Use Yes  . Types: Marijuana, Benzodiazepines    Social History   Socioeconomic History  . Marital status: Single    Spouse name: Not on file  . Number of children: Not on file  . Years of education: Not on file  . Highest education level: Not on file  Occupational History  . Not on file  Social Needs  . Financial resource strain: Not on file  . Food insecurity:    Worry: Not on file    Inability: Not on file  . Transportation needs:    Medical: Not on file    Non-medical: Not on file  Tobacco Use  . Smoking status: Current Every Day Smoker    Packs/day: 1.00    Types: Cigarettes  . Smokeless tobacco: Never Used  Substance and Sexual Activity  . Alcohol use: No  . Drug use: Yes    Types: Marijuana, Benzodiazepines  . Sexual activity: Yes  Lifestyle  . Physical activity:    Days per week: Not on file    Minutes per session: Not on file  . Stress: Not on file  Relationships  . Social connections:    Talks  on phone: Not on file    Gets together: Not on file    Attends religious service: Not on file    Active member of club or organization: Not on file    Attends meetings of clubs or organizations: Not on file    Relationship status: Not on file  Other Topics Concern  . Not on file  Social History Narrative  . Not on file   Additional Social History:                         Sleep: Good  Appetite:  Fair  Current Medications: Current Facility-Administered Medications  Medication Dose Route Frequency Provider Last Rate Last Dose  . acetaminophen (TYLENOL) tablet 650 mg  650 mg Oral Q6H PRN Laveda Abbe, NP      . alum & mag hydroxide-simeth (MAALOX/MYLANTA) 200-200-20 MG/5ML suspension 30 mL  30 mL Oral Q4H PRN Laveda Abbe, NP      . benztropine (COGENTIN) tablet 0.5 mg  0.5 mg Oral BID Laveda Abbe, NP   0.5 mg at 08/26/18 0815  . buPROPion (WELLBUTRIN XL) 24 hr tablet 450 mg  450 mg Oral Daily Laveda Abbe, NP   450 mg at 08/26/18 0815  . haloperidol (HALDOL) tablet 15 mg  15 mg Oral QHS Malvin Johns, MD       And  . Melene Muller ON 08/27/2018] haloperidol (HALDOL) tablet 5 mg  5 mg Oral Q breakfast Malvin Johns, MD      . hydrOXYzine (ATARAX/VISTARIL) tablet 25 mg  25 mg Oral TID PRN Laveda Abbe, NP      . magnesium hydroxide (MILK OF MAGNESIA) suspension 30 mL  30 mL Oral Daily PRN Laveda Abbe, NP      . traZODone (DESYREL) tablet 100 mg  100 mg Oral QHS Laveda Abbe, NP   100 mg at 08/25/18 2130  . traZODone (DESYREL) tablet 50 mg  50 mg Oral QHS PRN Laveda Abbe, NP        Lab Results:  Results for orders placed or performed during the hospital encounter of 08/24/18 (from the past 48 hour(s))  Comprehensive metabolic panel     Status: Abnormal   Collection Time: 08/24/18  6:30 PM  Result Value Ref Range   Sodium 139 135 - 145 mmol/L   Potassium 3.7 3.5 - 5.1 mmol/L   Chloride 105 98 - 111 mmol/L   CO2 23 22 - 32 mmol/L   Glucose, Bld 73 70 - 99 mg/dL   BUN 9 6 - 20 mg/dL   Creatinine, Ser 3.55 0.61 - 1.24 mg/dL   Calcium 9.1 8.9 - 73.2 mg/dL   Total Protein 6.8 6.5 - 8.1 g/dL   Albumin 4.4 3.5 - 5.0 g/dL   AST 53 (H) 15 - 41 U/L   ALT 28 0 - 44 U/L   Alkaline Phosphatase 55 38 - 126 U/L   Total Bilirubin 1.3 (H) 0.3 - 1.2 mg/dL   GFR calc non Af Amer >60 >60 mL/min   GFR calc Af Amer >60 >60 mL/min   Anion gap 11 5 - 15    Comment: Performed at [facility], [address] [place], [place] [address]  Ethanol     Status: None   Collection Time: 08/24/18  6:30 PM  Result Value Ref  Range   Alcohol, Ethyl (B) <10 <10 mg/dL    Comment: (NOTE) Lowest detectable  limit for serum alcohol is 10 mg/dL. For medical purposes only. Performed at [facility], [address] [place], [place] [address]   Urine rapid drug screen (hosp performed)     Status: Abnormal   Collection Time: 08/24/18  6:30 PM  Result Value Ref Range   Opiates NONE DETECTED NONE DETECTED   Cocaine NONE DETECTED NONE DETECTED   Benzodiazepines POSITIVE (A) NONE DETECTED   Amphetamines NONE DETECTED NONE DETECTED   Tetrahydrocannabinol POSITIVE (A) NONE DETECTED   Barbiturates NONE DETECTED NONE DETECTED    Comment: (NOTE) DRUG SCREEN FOR MEDICAL PURPOSES ONLY.  IF CONFIRMATION IS NEEDED FOR ANY PURPOSE, NOTIFY LAB WITHIN 5 DAYS. LOWEST DETECTABLE LIMITS FOR URINE DRUG SCREEN Drug Class                     Cutoff (ng/mL) Amphetamine and metabolites    1000 Barbiturate and metabolites    200 Benzodiazepine                 200 Tricyclics and metabolites     300 Opiates and metabolites        300 Cocaine and metabolites        300 THC                            50 Performed at [facility], [address] [place], [place] [address]   CBC with Diff     Status: None   Collection Time: 08/24/18  6:30 PM  Result Value Ref Range   WBC 7.6 4.0 - 10.5 K/uL   RBC 4.41 4.22 - 5.81 MIL/uL   Hemoglobin 13.5 13.0 - 17.0 g/dL   HCT 09.840.6 11.939.0 - 14.752.0 %   MCV 92.1 80.0 - 100.0 fL   MCH 30.6 26.0 - 34.0 pg   MCHC 33.3 30.0 - 36.0 g/dL   RDW 82.912.9 56.211.5 - 13.015.5 %   Platelets 240 150 - 400 K/uL   nRBC 0.0 0.0 - 0.2 %   Neutrophils Relative % 73 %   Neutro Abs 5.5 1.7 - 7.7 K/uL   Lymphocytes Relative 14 %   Lymphs Abs 1.1 0.7 - 4.0 K/uL   Monocytes Relative 11 %   Monocytes Absolute 0.8 0.1 - 1.0 K/uL   Eosinophils Relative 2 %   Eosinophils Absolute 0.2 0.0 - 0.5 K/uL   Basophils Relative 0 %   Basophils Absolute 0.0 0.0 - 0.1 K/uL   Immature Granulocytes 0 %    Abs Immature Granulocytes 0.02 0.00 - 0.07 K/uL   Reactive, Benign Lymphocytes PRESENT     Comment: Performed at [facility], [address] [place], [place] [address]  Salicylate level     Status: None   Collection Time: 08/24/18  6:30 PM  Result Value Ref Range   Salicylate Lvl <7.0 2.8 - 30.0 mg/dL    Comment: Performed at [facility], [address] [place], [place] [address]  Acetaminophen level     Status: Abnormal   Collection Time: 08/24/18  6:30 PM  Result Value Ref Range   Acetaminophen (Tylenol), Serum <10 (L) 10 - 30 ug/mL    Comment: (NOTE) Therapeutic concentrations vary significantly. A range of 10-30 ug/mL  may be an effective concentration for many patients. However, some  are best treated at concentrations outside of this range. Acetaminophen concentrations >150 ug/mL at 4 hours after ingestion  and >  50 ug/mL at 12 hours after ingestion are often associated with  toxic reactions. Performed at [facility], [address] [place], [place] [address]     Blood Alcohol level:  Lab Results  Component Value Date   [facility] <10 08/24/2018   ETH <5 11/04/2015    Metabolic Disorder Labs: Lab Results  Component Value Date   HGBA1C 5.6 11/08/2015   MPG 114 11/08/2015   MPG 111 09/13/2015   Lab Results  Component Value Date   PROLACTIN 110.3 (H) 11/08/2015   PROLACTIN 30.8 (H) 09/13/2015   Lab Results  Component Value Date   CHOL 136 11/08/2015   TRIG 86 11/08/2015   HDL 36 (L) 11/08/2015   CHOLHDL 3.8 11/08/2015   VLDL 17 11/08/2015   LDLCALC 83 11/08/2015   LDLCALC 84 09/14/2015    Physical Findings: AIMS: Facial and Oral Movements Muscles of Facial Expression: None, normal Lips and Perioral Area: None, normal Jaw: None, normal Tongue: None, normal,Extremity Movements Upper (arms, wrists, hands, fingers): None, normal Lower (legs, knees, ankles, toes): None, normal, Trunk Movements Neck,  shoulders, hips: None, normal, Overall Severity Severity of abnormal movements (highest score from questions above): None, normal Incapacitation due to abnormal movements: None, normal Patient's awareness of abnormal movements (rate only patient's report): No Awareness, Dental Status Current problems with teeth and/or dentures?: No Does patient usually wear dentures?: No  CIWA:  CIWA-Ar Total: 0 COWS:  COWS Total Score: 0  Musculoskeletal: Strength & Muscle Tone: within normal limits Gait & Station: normal Patient leans: N/A  Psychiatric Specialty Exam: Physical Exam  ROS  Blood pressure 115/72, pulse (!) 107, temperature (!) 97.5 F (36.4 C), temperature source Oral, resp. rate 18, height 5' 6.5" (1.689 m), weight 70.3 kg.Body mass index is 24.64 kg/m.  General Appearance: Casual  Eye Contact:  Good  Speech:  Pressured  Volume:  Increased  Mood: Hypomania  Affect:  Labile  Thought Process:  Descriptions of Associations: Loose  Orientation:  Full (Time, Place, and Person)  Thought Content:  Tangential  Suicidal Thoughts:  No  Homicidal Thoughts:  No  Memory:  Immediate;   Fair  Judgement:  Fair  Insight:  Shallow  Psychomotor Activity:  Increased  Concentration:  Concentration: Good  Recall:  Good  Fund of Knowledge:  Good  Language:  Good  Akathisia:  Negative  Handed:  Right  AIMS (if indicated):     Assets:  Social Support  ADL's:  Intact  Cognition:  WNL  Sleep:  Number of Hours: 5.75     Treatment Plan Summary: Daily contact with patient to assess and evaluate symptoms and progress in treatment and Medication management continue but escalate Haldol continue reality and cog based therapy no change in precautions or other meds  Korbyn Vanes, MD 08/26/2018, 11:23 AM

## 2018-08-26 NOTE — Progress Notes (Signed)
D: Pt denies SI/HI/AVH. Pt is pleasant and cooperative. Pt visible on the unit. Pt stated he felt better due to not dealing with none of the stuff that got him in here. Pt stated he remembered pt from prior admission, pt stated he was appreciative of the information provided.   A: Pt was offered support and encouragement. Pt was given scheduled medications. Pt was encourage to attend groups. Q 15 minute checks were done for safety.   R:Pt attends groups and interacts well with peers and staff. Pt is taking medication. Pt has no complaints.Pt receptive to treatment and safety maintained on unit.   Problem: Education: Goal: Emotional status will improve Outcome: Progressing   Problem: Education: Goal: Mental status will improve Outcome: Progressing   Problem: Activity: Goal: Interest or engagement in activities will improve Outcome: Progressing   Problem: Activity: Goal: Sleeping patterns will improve Outcome: Progressing

## 2018-08-26 NOTE — Progress Notes (Signed)
D:  Todd Shaw was pleasant and cooperative this evening.  He isolated to his room early in the shift but did finally come out for snack.  Minimal interaction with peers.  He denied SI/HI or A/V hallucinations.  He denied any pain or discomfort and appeared to be in no physical distress.  He took his hs medications without difficulty. A:  1:1 with RN for support and encouragement.  Medications as ordered.  Q 15 minute checks maintained for safety.  Encouraged participation in group and unit activities.   R:  Abukar remains safe on the unit.  We will continue to monitor the progress towards his goals.

## 2018-08-26 NOTE — Progress Notes (Signed)
Recreation Therapy Notes  Date: 2.17.20 Time: 1000 Location:  [address] Dayroom  Group Topic: Wellness  Goal Area(s) Addresses:  Patient will define components of whole wellness. Patient will verbalize benefit of whole wellness.  Behavioral Response: Engaged  Intervention:  Exercise  Activity: Exercise.  LRT led patients in a series of stretches to loosen them up.  Each patient was given the opportunity to lead the group in an exercise of their choice.  The group was to complete at least 30 minutes of exercise.  Patients were to take breaks and get water as needed.  Education: Wellness, Building control surveyor.   Education Outcome: Acknowledges education/In group clarification offered/Needs additional education.   Clinical Observations/Feedback: Pt was smiling and engaged during activity.  Pt completed all the exercise.  Pt got water as he needed it.  Pt was pleasant and active.    Caroll Rancher, LRT/CTRS         Caroll Rancher A 08/26/2018 11:31 AM

## 2018-08-26 NOTE — Progress Notes (Signed)
Adult Psychoeducational Group Note  Date:  08/26/2018 Time:  9:10 PM  Group Topic/Focus:  Wrap-Up Group:   The focus of this group is to help patients review their daily goal of treatment and discuss progress on daily workbooks.  Participation Level:  Active  Participation Quality:  Appropriate  Affect:  Appropriate  Cognitive:  Appropriate  Insight: Appropriate  Engagement in Group:  Engaged  Modes of Intervention:  Discussion  Additional Comments: The patient expressed that he attended groups.The patient also said that he attended obstacles group.  Todd Shaw 08/26/2018, 9:10 PM

## 2018-08-26 NOTE — Progress Notes (Signed)
Recreation Therapy Notes  INPATIENT RECREATION THERAPY ASSESSMENT  Patient Details Name: Deagon Uyehara MRN: [id number] DOB: May 02, 1986 Today's Date: 08/26/2018       Information Obtained From: Patient  Able to Participate in Assessment/Interview: Yes  Patient Presentation: Alert  Reason for Admission (Per Patient): Other (Comments)(Pt stated he was stressed out)  Patient Stressors: Family, Friends, Work, Other (Comment)(Finances)  Coping Skills:   Sports, TV, Arguments, Aggression, Music, Exercise, Meditate, Hot Bath/Shower, Impulsivity, Talk, Prayer, Avoidance, Read, Dance, Deep Breathing  Leisure Interests (2+):  Music - Play instrument, Individual - Writing  Frequency of Recreation/Participation: Weekly  Awareness of Community Resources:  Yes  Community Resources:  Park, Engineering geologist, Other (Comment)([place])  Current Use: Yes  If no, Barriers?:    Expressed Interest in [employer] Information: No  [place] of Residence:  [place]  Patient Main Form of Transportation: Public Transportation(Pt stated he also gets rides from his mother)  Patient Strengths:  Reading; History; Love people; Care  Patient Identified Areas of Improvement:  Focus level  Patient Goal for Hospitalization:  'stay focused, be positive"  Current SI (including self-harm):  No  Current HI:  No  Current AVH: No  Staff Intervention Plan: Group Attendance, Collaborate with Interdisciplinary Treatment Team  Consent to Intern Participation: N/A    Caroll Rancher, LRT/CTRS  Lillia Abed, Flois Mctague A 08/26/2018, 1:15 PM

## 2018-08-26 NOTE — [facility] Group Notes (Signed)
[facility] LCSW Group Therapy Note  Date/Time: 08/26/18, 1315  Type of Therapy and Topic:  Group Therapy:  Overcoming Obstacles  Participation Level:  active  Description of Group:    In this group patients will be encouraged to explore what they see as obstacles to their own wellness and recovery. They will be guided to discuss their thoughts, feelings, and behaviors related to these obstacles. The group will process together ways to cope with barriers, with attention given to specific choices patients can make. Each patient will be challenged to identify changes they are motivated to make in order to overcome their obstacles. This group will be process-oriented, with patients participating in exploration of their own experiences as well as giving and receiving support and challenge from other group members.  Therapeutic Goals: 1. Patient will identify personal and current obstacles as they relate to admission. 2. Patient will identify barriers that currently interfere with their wellness or overcoming obstacles.  3. Patient will identify feelings, thought process and behaviors related to these barriers. 4. Patient will identify two changes they are willing to make to overcome these obstacles:    Summary of Patient Progress : Pt came to group late but was active after he arrived.  Pt often had his hand up wanting to speak, identified his obstacle as "making people come together."   Most of his comments were not relevant to the topic or discussion and pt does not appear to be aware of this.      Therapeutic Modalities:   Cognitive Behavioral Therapy Solution Focused Therapy Motivational Interviewing Relapse Prevention Therapy  Daleen Squibb, LCSW

## 2018-08-27 MED ORDER — HALOPERIDOL 10 MG PO TABS: 20.0000 mg | ORAL_TABLET | Freq: Every day | ORAL | 2 refills | Status: DC

## 2018-08-27 MED ORDER — HALOPERIDOL 5 MG PO TABS
20.0000 mg | ORAL_TABLET | Freq: Every day | ORAL | Status: DC
  Filled 2018-08-27 (×2): qty 4

## 2018-08-27 MED ORDER — HALOPERIDOL 5 MG PO TABS: 20.0000 mg | ORAL_TABLET | Freq: Every evening | ORAL | Status: DC | PRN

## 2018-08-27 MED ORDER — BENZTROPINE MESYLATE 0.5 MG PO TABS: 0.5000 mg | ORAL_TABLET | Freq: Two times a day (BID) | ORAL | 2 refills | Status: DC

## 2018-08-27 MED ORDER — TRAZODONE HCL 50 MG PO TABS: 50.0000 mg | ORAL_TABLET | Freq: Every evening | ORAL | 1 refills | Status: DC | PRN
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
## 2018-08-27 NOTE — Progress Notes (Signed)
Patient ID: Todd Shaw, male   DOB: 05-09-1986, 33 y.o.   MRN: [id number]  D: Pt alert and oriented on the unit.   A: Education, support, and encouragement provided. Discharge summary, medications and follow up appointments reviewed with pt. Suicide prevention resources provided, including "My 3 App." Pt's belongings in locker #27 returned and belongings sheet signed.  R: Pt denies SI/HI, A/VH, pain, or any concerns at this time. Pt ambulatory on and off unit. Pt discharged to lobby.

## 2018-08-27 NOTE — Progress Notes (Signed)
Recreation Therapy Notes  INPATIENT RECREATION TR PLAN  Patient Details Name: Todd Shaw MRN: [id number] DOB: Aug 10, 1985 Today's Date: 08/27/2018  Rec Therapy Plan Is patient appropriate for Therapeutic Recreation?: Yes Treatment times per week: about 3 days Estimated Length of Stay: 5-7 days TR Treatment/Interventions: Group participation (Comment)  Discharge Criteria Pt will be discharged from therapy if:: Discharged Treatment plan/goals/alternatives discussed and agreed upon by:: Patient/family  Discharge Summary Short term goals set: See patient care plan Short term goals met: Complete Progress toward goals comments: Groups attended Which groups?: Wellness, Communication Reason goals not met: None Therapeutic equipment acquired: N/A Reason patient discharged from therapy: Discharge from hospital Pt/family agrees with progress & goals achieved: Yes Date patient discharged from therapy: 08/27/18     Victorino Sparrow, LRT/CTRS  Ria Comment, [place] 08/27/2018, 12:47 PM

## 2018-08-27 NOTE — BHH Suicide Risk Assessment (Signed)
[facility] Discharge Suicide Risk Assessment   Principal Problem: Bipolar manic Discharge Diagnoses: Active Problems:   Severe manic bipolar 1 disorder with psychotic behavior (HCC) Currently is alert oriented generally engaging and appears hypomanic but mother insist that he has baseline he is normally an outgoing personality she does not see this is pathology.  No EPS or TD no thoughts of harming self or others  Total Time spent with patient: 45 minutes Mental Status Per Nursing Assessment::   On Admission:  NA  Demographic Factors:  Male  Loss Factors: NA  Historical Factors: NA  Risk Reduction Factors:   NA  Continued Clinical Symptoms:  Bipolar Disorder:   Bipolar II Mixed State  Cognitive Features That Contribute To Risk:  None    Suicide Risk:  Minimal: No identifiable suicidal ideation.  Patients presenting with no risk factors but with morbid ruminations; may be classified as minimal risk based on the severity of the depressive symptoms    Plan Of Care/Follow-up recommendations:  Activity:  full- may return to work  Malvin Johns, MD 08/27/2018, 10:54 AM

## 2018-08-27 NOTE — Progress Notes (Signed)
Recreation Therapy Notes  Date: 2.18.20 Time: 1000 Location: 500 Hall Dayroom  Group Topic: Communication  Goal Area(s) Addresses:  Patient will effectively communicate with peers in group.  Patient will verbalize benefit of healthy communication. Patient will verbalize positive effect of healthy communication on post d/c goals.  Patient will identify communication techniques that made activity effective for group.   Behavioral Response: Engaged  Intervention: Merchandiser, retail, blank paper, pencils  Activity: Back to Back Drawings.  Patients were given the opportunity to describe a geometrical drawing to their peers.  The patient that was giving the instructions were to give the direction of the shapes, whether they were to be shaded in and the type of shape.  The patients doing the drawing could not ask any clarifying questions, they could only ask the person giving the instructions to repeat themselves.  When completed, they would match up pictures to see how accurate the drawings were to the original.  Education: Communication, Discharge Planning  Education Outcome: Acknowledges understanding/In group clarification offered/Needs additional education.   Clinical Observations/Feedback: Pt expressed he would have explained the picture better because he gave no specifics as to what to shade in or what direction the shapes needed to be drawn.  Pt explained as the listener, his peer gave good instructions he just couldn't picture it.    Caroll Rancher, LRT/CTRS    Caroll Rancher A 08/27/2018 12:25 PM

## 2018-08-27 NOTE — BHH Suicide Risk Assessment (Signed)
B[facility] INPATIENT:  Family/Significant Other Suicide Prevention Education  Suicide Prevention Education:  Education Completed; Todd Shaw, mother, [phone] has been identified by the patient as the family member/significant other with whom the patient will be residing, and identified as the person(s) who will aid the patient in the event of a mental health crisis (suicidal ideations/suicide attempt).  With written consent from the patient, the family member/significant other has been provided the following suicide prevention education, prior to the and/or following the discharge of the patient.  The suicide prevention education provided includes the following:  Suicide risk factors  Suicide prevention and interventions  National Suicide Hotline telephone number  [facility] assessment telephone number  [facility] Emergency Assistance 911  [facility] and/or Residential Mobile Crisis Unit telephone number  Request made of family/significant other to:  Remove weapons (e.g., guns, rifles, knives), all items previously/currently identified as safety concern.    Remove drugs/medications (over-the-counter, prescriptions, illicit drugs), all items previously/currently identified as a safety concern.  The family member/significant other verbalizes understanding of the suicide prevention education information provided.  The family member/significant other agrees to remove the items of safety concern listed above.  Mom has no safety concerns for the patient. Agreeable to discharge.  Todd Shaw 08/27/2018, 11:09 AM

## 2018-08-27 NOTE — Plan of Care (Signed)
  Problem: Activity: Goal: Interest or engagement in activities will improve Outcome: Progressing   Problem: Coping: Goal: Ability to verbalize frustrations and anger appropriately will improve Outcome: Progressing Goal: Ability to demonstrate self-control will improve Outcome: Progressing   Problem: Coping: Goal: Ability to demonstrate self-control will improve Outcome: Progressing   D: Pt alert and oriented on the unit. Pt engaging with RN staff and other pts. Pt denies SI/HI, A/VH. Pt also participated during unit groups and activities. Pt is pleasant and cooperative. Pt rated his depression, feelings of hopelessness, and anxiety all a 0, with 10 being the worst. Pt's goal for the day is "Going home so that I can go to work."  A: Education, support and encouragement provided, q15 minute safety checks remain in effect. Medications administered per MD orders.  R: No reactions/side effects to medicine noted. Pt denies any concerns at this time, and verbally contracts for safety. Pt ambulating on the unit with no issues. Pt remains safe on and off the unit.

## 2018-08-27 NOTE — Progress Notes (Signed)
  [facility] Adult Case Management Discharge Plan :  Will you be returning to the same living situation after discharge:  Yes,  home At discharge, do you have transportation home?: Yes,  mom will pick up after 1:30pm Do you have the ability to pay for your medications: Yes,  income from employment  Release of information consent forms completed and in the chart; work letter on chart for patient.   Patient to Follow up at: Follow-up Information    [name] Follow up.   Why:  Walk in hours are 8am-3:30pm, Monday through Friday. Please bring your photo ID and hospital discharge paperwork with you. Contact information: [address] [place] [address] [phone]           Next level of care provider has access to [facility] Link:no  Safety Planning and Suicide Prevention discussed: Yes,  with mother  Have you used any form of tobacco in the last 30 days? (Cigarettes, Smokeless Tobacco, Cigars, and/or Pipes): Yes  Has patient been referred to the Quitline?: Patient refused referral  Patient has been referred for addiction treatment: Yes  Darreld Mclean, LCSWA 08/27/2018, 12:19 PM

## 2018-08-27 NOTE — Plan of Care (Signed)
Pt was able to focus on tasks without prompts during recreation therapy sessions.    Caroll Rancher, LRT/CTRS

## 2019-01-15 ENCOUNTER — Other Ambulatory Visit: Payer: Self-pay

## 2019-01-15 ENCOUNTER — Emergency Department
Admission: EM | Admit: 2019-01-15 | Discharge: 2019-01-18 | Disposition: A | Discharge status: Home / Self Care | Payer: Self-pay | Attending: Emergency Medicine | Admitting: Emergency Medicine

## 2019-01-15 DIAGNOSIS — Z03818 Encounter for observation for suspected exposure to other biological agents ruled out: Secondary | ICD-10-CM | POA: Insufficient documentation

## 2019-01-15 DIAGNOSIS — F17210 Nicotine dependence, cigarettes, uncomplicated: Secondary | ICD-10-CM | POA: Insufficient documentation

## 2019-01-15 DIAGNOSIS — E876 Hypokalemia: Secondary | ICD-10-CM | POA: Insufficient documentation

## 2019-01-15 DIAGNOSIS — F23 Brief psychotic disorder: Secondary | ICD-10-CM

## 2019-01-15 DIAGNOSIS — F1210 Cannabis abuse, uncomplicated: Secondary | ICD-10-CM | POA: Insufficient documentation

## 2019-01-15 DIAGNOSIS — F312 Bipolar disorder, current episode manic severe with psychotic features: Secondary | ICD-10-CM | POA: Insufficient documentation

## 2019-01-15 LAB — CBC WITH DIFFERENTIAL/PLATELET
Abs Immature Granulocytes: 0.02 10*3/uL (ref 0.00–0.07)
Basophils Absolute: 0 10*3/uL (ref 0.0–0.1)
Basophils Relative: 0 %
Eosinophils Absolute: 0 10*3/uL (ref 0.0–0.5)
Eosinophils Relative: 0 %
HCT: 40.3 % (ref 39.0–52.0)
Hemoglobin: 14.1 g/dL (ref 13.0–17.0)
Immature Granulocytes: 0 %
Lymphocytes Relative: 18 %
Lymphs Abs: 1.5 10*3/uL (ref 0.7–4.0)
MCH: 31.3 pg (ref 26.0–34.0)
MCHC: 35 g/dL (ref 30.0–36.0)
MCV: 89.6 fL (ref 80.0–100.0)
Monocytes Absolute: 0.9 10*3/uL (ref 0.1–1.0)
Monocytes Relative: 11 %
Neutro Abs: 5.7 10*3/uL (ref 1.7–7.7)
Neutrophils Relative %: 71 %
Platelets: 219 10*3/uL (ref 150–400)
RBC: 4.5 MIL/uL (ref 4.22–5.81)
RDW: 12.5 % (ref 11.5–15.5)
WBC: 8.1 10*3/uL (ref 4.0–10.5)
nRBC: 0 % (ref 0.0–0.2)

## 2019-01-15 LAB — COMPREHENSIVE METABOLIC PANEL
ALT: 58 U/L — ABNORMAL HIGH (ref 0–44)
AST: 156 U/L — ABNORMAL HIGH (ref 15–41)
Albumin: 4.6 g/dL (ref 3.5–5.0)
Alkaline Phosphatase: 55 U/L (ref 38–126)
Anion gap: 15 (ref 5–15)
BUN: 19 mg/dL (ref 6–20)
CO2: 20 mmol/L — ABNORMAL LOW (ref 22–32)
Calcium: 8.9 mg/dL (ref 8.9–10.3)
Chloride: 101 mmol/L (ref 98–111)
Creatinine, Ser: 1.26 mg/dL — ABNORMAL HIGH (ref 0.61–1.24)
GFR calc Af Amer: >60 mL/min (ref >60)
GFR calc non Af Amer: >60 mL/min (ref >60)
Glucose, Bld: 123 mg/dL — ABNORMAL HIGH (ref 70–99)
Potassium: 3 mmol/L — ABNORMAL LOW (ref 3.5–5.1)
Sodium: 136 mmol/L (ref 135–145)
Total Bilirubin: 1.4 mg/dL — ABNORMAL HIGH (ref 0.3–1.2)
Total Protein: 7.7 g/dL (ref 6.5–8.1)

## 2019-01-15 LAB — URINE DRUG SCREEN, QUALITATIVE (ARMC ONLY)
Amphetamines, Ur Screen: NOT DETECTED
Barbiturates, Ur Screen: NOT DETECTED
Benzodiazepine, Ur Scrn: NOT DETECTED
Cannabinoid 50 Ng, Ur ~~LOC~~: POSITIVE — AB
Cocaine Metabolite,Ur ~~LOC~~: NOT DETECTED
MDMA (Ecstasy)Ur Screen: NOT DETECTED
Methadone Scn, Ur: NOT DETECTED
Opiate, Ur Screen: NOT DETECTED
Phencyclidine (PCP) Ur S: NOT DETECTED
Tricyclic, Ur Screen: NOT DETECTED

## 2019-01-15 LAB — SALICYLATE LEVEL: Salicylate Lvl: <7 mg/dL (ref 2.8–30.0)

## 2019-01-15 LAB — SARS CORONAVIRUS 2 BY RT PCR (HOSPITAL ORDER, PERFORMED IN ~~LOC~~ HOSPITAL LAB): SARS Coronavirus 2: NEGATIVE

## 2019-01-15 LAB — ETHANOL: Alcohol, Ethyl (B): <10 mg/dL (ref <10)

## 2019-01-15 LAB — ACETAMINOPHEN LEVEL: Acetaminophen (Tylenol), Serum: <10 ug/mL — ABNORMAL LOW (ref 10–30)

## 2019-01-15 MED ORDER — POTASSIUM CHLORIDE CRYS ER 20 MEQ PO TBCR
40.0000 meq | EXTENDED_RELEASE_TABLET | Freq: Once | ORAL | Status: AC
  Administered 2019-01-15: 06:00:00 40 meq via ORAL
  Filled 2019-01-15: qty 2

## 2019-01-15 MED ORDER — LORAZEPAM 2 MG/ML IJ SOLN: 2.0000 mg | Freq: Four times a day (QID) | INTRAMUSCULAR | Status: DC | PRN

## 2019-01-15 MED ORDER — DIPHENHYDRAMINE HCL 50 MG/ML IJ SOLN: 50.0000 mg | Freq: Four times a day (QID) | INTRAMUSCULAR | Status: DC | PRN

## 2019-01-15 MED ORDER — LORAZEPAM 2 MG PO TABS: 2.0000 mg | ORAL_TABLET | Freq: Four times a day (QID) | ORAL | Status: DC | PRN

## 2019-01-15 MED ORDER — HALOPERIDOL 5 MG PO TABS: 5.0000 mg | ORAL_TABLET | Freq: Four times a day (QID) | ORAL | Status: DC | PRN

## 2019-01-15 MED ORDER — TRAZODONE HCL 50 MG PO TABS
50.0000 mg | ORAL_TABLET | Freq: Every evening | ORAL | Status: DC | PRN
  Administered 2019-01-16 – 2019-01-17 (×2): 50 mg via ORAL
  Filled 2019-01-15 (×2): qty 1

## 2019-01-15 MED ORDER — IBUPROFEN 600 MG PO TABS
600.0000 mg | ORAL_TABLET | Freq: Three times a day (TID) | ORAL | Status: DC | PRN
  Administered 2019-01-15 – 2019-01-18 (×7): 600 mg via ORAL
  Filled 2019-01-15 (×7): qty 1

## 2019-01-15 MED ORDER — HALOPERIDOL 5 MG PO TABS
10.0000 mg | ORAL_TABLET | Freq: Two times a day (BID) | ORAL | Status: DC
  Administered 2019-01-15 – 2019-01-16 (×2): 10 mg via ORAL
  Filled 2019-01-15 (×3): qty 2

## 2019-01-15 MED ORDER — DIPHENHYDRAMINE HCL 25 MG PO CAPS
50.0000 mg | ORAL_CAPSULE | Freq: Four times a day (QID) | ORAL | Status: DC | PRN
  Administered 2019-01-16 – 2019-01-17 (×2): 50 mg via ORAL
  Filled 2019-01-15: qty 2

## 2019-01-15 MED ORDER — HALOPERIDOL LACTATE 5 MG/ML IJ SOLN: 5.0000 mg | Freq: Four times a day (QID) | INTRAMUSCULAR | Status: DC | PRN

## 2019-01-15 MED ORDER — BENZTROPINE MESYLATE 1 MG PO TABS
0.5000 mg | ORAL_TABLET | Freq: Two times a day (BID) | ORAL | Status: DC
  Administered 2019-01-15 – 2019-01-18 (×4): 0.5 mg via ORAL
  Filled 2019-01-15 (×5): qty 1

## 2019-01-15 MED ORDER — ZIPRASIDONE MESYLATE 20 MG IM SOLR
20.0000 mg | Freq: Once | INTRAMUSCULAR | Status: AC
  Administered 2019-01-15: 01:00:00 20 mg via INTRAMUSCULAR

## 2019-01-15 NOTE — ED Notes (Signed)
Pt took medication after conversation with [facility]. Pt said his foot pain level is a 10/10; patient has blisters on his feet; pt is walking without acute distress or impaired gait. Will continue to monitor for needs/safety.
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
## 2019-01-15 NOTE — ED Notes (Signed)
Pt given OJ to drink.  

## 2019-01-15 NOTE — ED Notes (Signed)
Pt was given the phone to make a phone call. 

## 2019-01-15 NOTE — ED Notes (Signed)
Pt is no longer diaphoretic,. Pt is beginning to settle down but is still irate and is blaming others for the current situation. Pt is stating that his family doesn't want to help him and his biological father won't help him. Pt states he doesn't take any medications. Pt says he does better without them.

## 2019-01-15 NOTE — ED Notes (Signed)
Report called to [employer] Pt to [place].

## 2019-01-15 NOTE — Progress Notes (Signed)
Todd Shaw is a 33 y.o. male patient who presented to the ED.  Per triage nurse notes; the patient was brought to [facility] via law enforcement under involuntary commitment status (IVC) for care of a psychotic break. It was discussed that EMS was called out to assist [employer] in bringing the patient into the hospital. Upon arrival the patient was very diaphoretic. Manic, pressured speech, tangential, spitting, biting, kicking, and yelling. The patient arrives via stretcher in handcuffs, shackles and a spit mask. Mother was present on scene and took out the IVC papers. It was discussed that the patient has presented with these episodes in the past. Due to the patient above behaviors this provider was unable to assess patient at this time.

## 2019-01-15 NOTE — ED Notes (Signed)
BEHAVIORAL HEALTH ROUNDING Patient sleeping: No. Patient alert and oriented: yes Behavior appropriate: Yes.  ; If no, describe:  Nutrition and fluids offered: yes Toileting and hygiene offered: Yes  Sitter present: q15 minute observations and security camera monitoring Law enforcement present: Yes  ODS  

## 2019-01-15 NOTE — ED Notes (Signed)
Pt is awake and did change his clothes but left his underwear on and put hospital underwear on over top. Pt is cooperative this time. Will attempt to get underwear later at shower time. Pt has strong body odor and requires a shower this morning. Pt is still confused but calm.

## 2019-01-15 NOTE — ED Notes (Signed)
Report received from [place]. Upon approach, pt's affect is elevated and pleasant. Spoke with him about the need to take medications and the potential consequences of not doing so. He maintained that does not need medication and will continue refusing. "I'll just stay here day after day." Pt asked if he could shower (he had done so this a.m.) and was understanding when advised that showers are done in the morning. Will continue to monitor for needs/safety.

## 2019-01-15 NOTE — ED Notes (Signed)
Pt given lunch tray.

## 2019-01-15 NOTE — ED Notes (Signed)
Pt. Transferred to [place] from ED to room 7 after screening for contraband. Report to include Situation, Background, Assessment and Recommendations from [facility]. Pt. Oriented to unit including Q15 minute rounds as well as the security cameras for their protection. Patient is alert and oriented, warm and dry in no acute distress. Patient denies SI, HI, and AVH. Pt. Encouraged to let me know if needs arise.

## 2019-01-15 NOTE — ED Triage Notes (Signed)
Pt to the er for psychotic break. EMS called out to assist [employer] in bringing in IVC pt. PT is very diaphoretic. Manic, pressured speech, yelling, tangental, spitting, biting, kicking, and yelling. Pt arrives via stretcher in handcuffs and shackles and a spit mask. Mother was present on scene and took out the papers. Pt has ahd these episodes before.

## 2019-01-15 NOTE — ED Notes (Signed)
He is awake - sitting in a chair in the hallway - call light is blinking NAD observed  Pt stating  "This is a big misunderstanding - I am ready to go home"  I introduced myself to him   Plan of care discussed

## 2019-01-15 NOTE — ED Notes (Signed)
TTS unable to assess patient at this time

## 2019-01-15 NOTE — ED Notes (Signed)
meds offered - pt refused

## 2019-01-15 NOTE — ED Notes (Signed)

## 2019-01-15 NOTE — ED Notes (Signed)
He is currently taking a shower  

## 2019-01-15 NOTE — ED Notes (Addendum)
Pt arrives in handcuffs and a spit mask. Pt is manic, violent, paranoid and aggressive. Unable to calm pt. Pt moved to the hospital bed from stretcher and handcuffs removed and pt placed in soft restraints for his safety and the safety of others. Per police, pt bit a family member and several officers. Pt is homeless as he chooses or family chooses for him not to live with them. Pt denies taking medications. Pt is unable to be calmed and is wanting to leave. Pt is a danger to himself stated by family and witnessed by EMS, police and hospital staff.

## 2019-01-15 NOTE — ED Notes (Addendum)
I have received back all items that I gave him for a shower  - plan of care discussed in detail   IVC discussed  Psychiatry evaluation pending  He denies pain  Assessment completed

## 2019-01-15 NOTE — ED Notes (Signed)
ED [facility] [place] Is the patient under IVC or is there intent for IVC: Yes.   Is the patient medically cleared: Yes.   Is there vacancy in the ED BHU: Yes.   Is the population mix appropriate for patient: Yes.   Is the patient awaiting placement in inpatient or outpatient setting:   Has the patient had a psychiatric consult: awaiting psychiatry consultation  Survey of unit performed for contraband, proper placement and condition of furniture, tampering with fixtures in bathroom, shower, and each patient room: Yes.  ; Findings:  APPEARANCE/BEHAVIOR Calm and cooperative NEURO ASSESSMENT Orientation: oriented x3  Denies pain Hallucinations: No.None noted (Hallucinations) denies  Speech: Normal Gait: normal RESPIRATORY ASSESSMENT Even  Unlabored respirations  CARDIOVASCULAR ASSESSMENT Pulses equal   regular rate  Skin warm and dry   GASTROINTESTINAL ASSESSMENT no GI complaint EXTREMITIES Full ROM  PLAN OF CARE Provide calm/safe environment. Vital signs assessed twice daily. ED BHU Assessment once each 12-hour shift.   Assure the ED provider has rounded once each shift. Provide and encourage hygiene. Provide redirection as needed. Assess for escalating behavior; address immediately and inform ED provider.  Assess family dynamic and appropriateness for visitation as needed: Yes.  ; If necessary, describe findings:  Educate the patient/family about BHU procedures/visitation: Yes.  ; If necessary, describe findings:

## 2019-01-15 NOTE — Consult Note (Signed)
[facility]   Reason for Consult:  Aggressive and manis behavior Referring Physician:  EDP Patient Identification: Todd Shaw MRN:  [id number] Principal Diagnosis: Severe manic bipolar 1 disorder with psychotic behavior (HCC) Diagnosis:  Principal Problem:   Severe manic bipolar 1 disorder with psychotic behavior (HCC)   Total Time spent with patient: 20 minutes  Subjective:   Todd Shaw is a 33 y.o. male patient reports that he does not feel that he did anything wrong.  Patient states that he has not been taking his medications since he left the hospital in February.  Patient denies any suicidal or homicidal ideations and denies any hallucinations.  Patient denies harming anyone or being aggressive yesterday.  Patient states that he just needs to go home and he is not going to return to his mom's because she is doing nothing to try to make him stay in the hospital.  Patient states that he is not going to take any medications.  HPI:  33 y.o. male brought to the ED from home via EMS and multiple [place] police officers under IVC.  Patient has a history of bipolar disorder who his mother states he has not been taking his medicines.  Arrives to the ED handcuffed with a spit mask over his face, yelling, tangential with pressured speech, bit to officers, kicking and yelling.  Patient is seen by me face-to-face and I consulted with Dr. Lucianne MussKumar.  Patient presents anxious, irritable, with pressured speech and continuing to deny having any type of mental health issues or needing any type of psychotropic medications.  Patient has been reported to be on the unit yelling and cursing and has been on the phone several times and has been agitated and upset today.  Nursing reports that he has been slightly redirectable.  Patient has been refusing to take medications today as well. Patient meets psychiatric inpatient criteria and CSW is seeking placement.   Past Psychiatric History: MDD,  bipolar disorder  Risk to Self:   Risk to Others:   Prior Inpatient Therapy:   Prior Outpatient Therapy:    Past Medical History:  Past Medical History:  Diagnosis Date  . Bipolar 1 disorder (HCC)   . Hallucinations   . Psychosis [facility](HCC)     Past Surgical History:  Procedure Laterality Date  . APPLICATION OF A-CELL OF EXTREMITY Right 01/08/2017   Procedure: APPLICATION OF A-CELL RIGHT LOWER LEG;  Surgeon: Glenna Fellowshimmappa, Brinda, MD;  Location: [facility] OR;  Service: Plastics;  Laterality: Right;  . APPLICATION OF WOUND VAC Right 01/01/2017   Procedure: RIGHT LEG WOUND VAC CHANGE with irrigation and DEBRIDEMENT of right leg fasciotomy site;  Surgeon: Larina EarthlyEarly, Todd F, MD;  [facility];  Service: Vascular;  Laterality: Right;  . APPLICATION OF WOUND VAC Right 01/03/2017   Procedure: WOUND VAC CHANGE;  Surgeon: Sherren KernsFields, Charles E, MD;  [facility];  Service: Vascular;  Laterality: Right;  . FASCIOTOMY Right 12/31/2016   Procedure: FASCIOTOMY, RIGHT LOWER EXTREMITY COMPARTMENT;  Surgeon: Maeola Harmanain, Brandon Christopher, MD;  [facility];  Service: Vascular;  Laterality: Right;  . I&D EXTREMITY Right 01/03/2017   Procedure: RIGHT LOWER EXTREMITY FASCIOTOMY WASH-OUT. DEBRIDEMENT;  Surgeon: Sherren KernsFields, Charles E, MD;  [facility];  Service: Vascular;  Laterality: Right;  . SKIN SPLIT GRAFT Right 01/08/2017   Procedure: SKIN GRAFT SPLIT THICKNESS from right thigh to right leg;  Surgeon: Glenna Fellowshimmappa, Brinda, MD;  Location: [facility] OR;  Service: Plastics;  Laterality: Right;   Family History:  Family History  Problem Relation Age of Onset  . Mental illness Neg Hx    Family Psychiatric  History: None reported Social History:  Social History   Substance and Sexual Activity  Alcohol Use No     Social History   Substance and Sexual Activity  Drug Use Yes  . Types: Marijuana, Benzodiazepines    Social History   Socioeconomic History  . Marital status: Single    Spouse name: Not on file  . Number of  children: Not on file  . Years of education: Not on file  . Highest education level: Not on file  Occupational History  . Not on file  Social Needs  . Financial resource strain: Not on file  . Food insecurity    Worry: Not on file    Inability: Not on file  . Transportation needs    Medical: Not on file    Non-medical: Not on file  Tobacco Use  . Smoking status: Current Every Day Smoker    Packs/day: 1.00    Types: Cigarettes  . Smokeless tobacco: Never Used  Substance and Sexual Activity  . Alcohol use: No  . Drug use: Yes    Types: Marijuana, Benzodiazepines  . Sexual activity: Yes  Lifestyle  . Physical activity    Days per week: Not on file    Minutes per session: Not on file  . Stress: Not on file  Relationships  . Social Musicianconnections    Talks on phone: Not on file    Gets together: Not on file    Attends religious service: Not on file    Active member of club or organization: Not on file    Attends meetings of clubs or organizations: Not on file    Relationship status: Not on file  Other Topics Concern  . Not on file  Social History Narrative  . Not on file   Additional Social History:    Allergies:   Allergies  Allergen Reactions  . No Known Allergies     Labs:  Results for orders placed or performed during the hospital encounter of 01/15/19 (from the past 48 hour(s))  Urine Drug Screen, Qualitative     Status: Abnormal   Collection Time: 01/15/19  1:07 AM  Result Value Ref Range   Tricyclic, Ur Screen NONE DETECTED NONE DETECTED   Amphetamines, Ur Screen NONE DETECTED NONE DETECTED   MDMA (Ecstasy)Ur Screen NONE DETECTED NONE DETECTED   Cocaine Metabolite,Ur [place] NONE DETECTED NONE DETECTED   Opiate, Ur Screen NONE DETECTED NONE DETECTED   Phencyclidine (PCP) Ur S NONE DETECTED NONE DETECTED   Cannabinoid 50 Ng, Ur  POSITIVE (A) NONE DETECTED   Barbiturates, Ur Screen NONE DETECTED NONE DETECTED   Benzodiazepine, Ur Scrn NONE DETECTED NONE  DETECTED   Methadone Scn, Ur NONE DETECTED NONE DETECTED    Comment: (NOTE) Tricyclics + metabolites, urine    Cutoff 1000 ng/mL Amphetamines + metabolites, urine  Cutoff 1000 ng/mL MDMA (Ecstasy), urine              Cutoff 500 ng/mL Cocaine Metabolite, urine          Cutoff 300 ng/mL Opiate + metabolites, urine        Cutoff 300 ng/mL Phencyclidine (PCP), urine         Cutoff 25 ng/mL Cannabinoid, urine                 Cutoff 50 ng/mL Barbiturates + metabolites, urine  Cutoff 200  ng/mL Benzodiazepine, urine              Cutoff 200 ng/mL Methadone, urine                   Cutoff 300 ng/mL The urine drug screen provides only a preliminary, unconfirmed analytical test result and should not be used for non-medical purposes. Clinical consideration and professional judgment should be applied to any positive drug screen result due to possible interfering substances. A more specific alternate chemical method must be used in order to obtain a confirmed analytical result. Gas chromatography / mass spectrometry (GC/MS) is the preferred confirmat ory method. Performed at [facility], [address] [place], [place] [address]   CBC with Differential     Status: None   Collection Time: 01/15/19  1:35 AM  Result Value Ref Range   WBC 8.1 4.0 - 10.5 K/uL   RBC 4.50 4.22 - 5.81 MIL/uL   Hemoglobin 14.1 13.0 - 17.0 g/dL   HCT 60.4 54.0 - 98.1 %   MCV 89.6 80.0 - 100.0 fL   MCH 31.3 26.0 - 34.0 pg   MCHC 35.0 30.0 - 36.0 g/dL   RDW 19.1 47.8 - 29.5 %   Platelets 219 150 - 400 K/uL   nRBC 0.0 0.0 - 0.2 %   Neutrophils Relative % 71 %   Neutro Abs 5.7 1.7 - 7.7 K/uL   Lymphocytes Relative 18 %   Lymphs Abs 1.5 0.7 - 4.0 K/uL   Monocytes Relative 11 %   Monocytes Absolute 0.9 0.1 - 1.0 K/uL   Eosinophils Relative 0 %   Eosinophils Absolute 0.0 0.0 - 0.5 K/uL   Basophils Relative 0 %   Basophils Absolute 0.0 0.0 - 0.1 K/uL   Immature Granulocytes 0 %   Abs Immature Granulocytes  0.02 0.00 - 0.07 K/uL    Comment: Performed at [facility], [address] [place], [place] [address]  Comprehensive metabolic panel     Status: Abnormal   Collection Time: 01/15/19  1:35 AM  Result Value Ref Range   Sodium 136 135 - 145 mmol/L   Potassium 3.0 (L) 3.5 - 5.1 mmol/L   Chloride 101 98 - 111 mmol/L   CO2 20 (L) 22 - 32 mmol/L   Glucose, Bld 123 (H) 70 - 99 mg/dL   BUN 19 6 - 20 mg/dL   Creatinine, Ser 8.65 (H) 0.61 - 1.24 mg/dL   Calcium 8.9 8.9 - 78.4 mg/dL   Total Protein 7.7 6.5 - 8.1 g/dL   Albumin 4.6 3.5 - 5.0 g/dL   AST 696 (H) 15 - 41 U/L   ALT 58 (H) 0 - 44 U/L   Alkaline Phosphatase 55 38 - 126 U/L   Total Bilirubin 1.4 (H) 0.3 - 1.2 mg/dL   GFR calc non Af Amer >60 >60 mL/min   GFR calc Af Amer >60 >60 mL/min   Anion gap 15 5 - 15    Comment: Performed at [facility], [address] [place], [place] [address]  Ethanol     Status: None   Collection Time: 01/15/19  1:35 AM  Result Value Ref Range   Alcohol, Ethyl (B) <10 <10 mg/dL    Comment: (NOTE) Lowest detectable limit for serum alcohol is 10 mg/dL. For medical purposes only. Performed at [facility], [address] [place], [place] [address]   Acetaminophen level     Status: Abnormal   Collection Time: 01/15/19  1:35 AM  Result Value Ref Range   Acetaminophen (Tylenol), Serum <10 (L) 10 - 30 ug/mL    Comment: (NOTE) Therapeutic concentrations vary significantly. A range of 10-30 ug/mL  may be an effective concentration for many patients. However, some  are best treated at concentrations outside of this range. Acetaminophen concentrations >150 ug/mL at 4 hours after ingestion  and >50 ug/mL at 12 hours after ingestion are often associated with  toxic reactions. Performed at [facility], [address] [place], [place] [address]   Salicylate level     Status: None   Collection Time: 01/15/19  1:35 AM  Result Value Ref Range   Salicylate Lvl <8.5  2.8 - 30.0 mg/dL    Comment: Performed at [facility], [address], [place], [place] [address]  SARS Coronavirus 2 (CEPHEID - Performed in [place] hospital lab), Hosp Order     Status: None   Collection Time: 01/15/19  6:02 AM   Specimen: Nasopharyngeal Swab  Result Value Ref Range   SARS Coronavirus 2 NEGATIVE NEGATIVE    Comment: (NOTE) If result is NEGATIVE SARS-CoV-2 target nucleic acids are NOT DETECTED. The SARS-CoV-2 RNA is generally detectable in upper and lower  respiratory specimens during the acute phase of infection. The lowest  concentration of SARS-CoV-2 viral copies this assay can detect is 250  copies / mL. A negative result does not preclude SARS-CoV-2 infection  and should not be used as the sole basis for treatment or other  patient management decisions.  A negative result may occur with  improper specimen collection / handling, submission of specimen other  than nasopharyngeal swab, presence of viral mutation(s) within the  areas targeted by this assay, and inadequate number of viral copies  (<250 copies / mL). A negative result must be combined with clinical  observations, patient history, and epidemiological information. If result is POSITIVE SARS-CoV-2 target nucleic acids are DETECTED. The SARS-CoV-2 RNA is generally detectable in upper and lower  respiratory specimens dur ing the acute phase of infection.  Positive  results are indicative of active infection with SARS-CoV-2.  Clinical  correlation with patient history and other diagnostic information is  necessary to determine patient infection status.  Positive results do  not rule out bacterial infection or co-infection with other viruses. If result is PRESUMPTIVE POSTIVE SARS-CoV-2 nucleic acids MAY BE PRESENT.   A presumptive positive result was obtained on the submitted specimen  and confirmed on repeat testing.  While 2019 novel coronavirus  (SARS-CoV-2) nucleic acids may be present  in the submitted sample  additional confirmatory testing may be necessary for epidemiological  and / or clinical management purposes  to differentiate between  SARS-CoV-2 and other Sarbecovirus currently known to infect humans.  If clinically indicated additional testing with an alternate test  methodology [phone]) is advised. The SARS-CoV-2 RNA is generally  detectable in upper and lower respiratory sp ecimens during the acute  phase of infection. The expected result is Negative. Fact Sheet for Patients:  StrictlyIdeas.no Fact Sheet for Healthcare Providers: BankingDealers.co.za This test is not yet approved or cleared by the [place] FDA and has been authorized for detection and/or diagnosis of SARS-CoV-2 by FDA under an Emergency Use Authorization (EUA).  This EUA will remain in effect (meaning this test can be used) for the duration of the COVID-19 declaration under Section 564(b)(1) of the Act, 21 U.S.C. section 360bbb-3(b)(1), unless the authorization is terminated or revoked sooner. Performed at [facility],  [address], [place], [place] [address]     Current Facility-Administered Medications  Medication Dose Route Frequency Provider Last Rate Last Dose  . benztropine (COGENTIN) tablet 0.5 mg  0.5 mg Oral BID Lalah Durango, Feliz Beam B, FNP      . diphenhydrAMINE (BENADRYL) capsule 50 mg  50 mg Oral Q6H PRN Hadlea Furuya, Gerlene Burdock, FNP       Or  . diphenhydrAMINE (BENADRYL) injection 50 mg  50 mg Intramuscular Q6H PRN Kina Shiffman, Feliz Beam B, FNP      . haloperidol (HALDOL) tablet 10 mg  10 mg Oral BID Jordie Schreur, Feliz Beam B, FNP      . haloperidol (HALDOL) tablet 5 mg  5 mg Oral Q6H PRN Carlisia Geno, Gerlene Burdock, FNP       Or  . haloperidol lactate (HALDOL) injection 5 mg  5 mg Intramuscular Q6H PRN Rodolfo Gaster, Feliz Beam B, FNP      . ibuprofen (ADVIL) tablet 600 mg  600 mg Oral Q8H PRN Delena Casebeer, Gerlene Burdock, FNP   600 mg at 01/15/19 1109  . LORazepam (ATIVAN)  tablet 2 mg  2 mg Oral Q6H PRN Ariba Lehnen, Gerlene Burdock, FNP       Or  . LORazepam (ATIVAN) injection 2 mg  2 mg Intramuscular Q6H PRN Gabriele Zwilling, Gerlene Burdock, FNP      . traZODone (DESYREL) tablet 50 mg  50 mg Oral QHS PRN Coralie Stanke, Gerlene Burdock, FNP       Current Outpatient Medications  Medication Sig Dispense Refill  . benztropine (COGENTIN) 0.5 MG tablet Take 1 tablet (0.5 mg total) by mouth 2 (two) times daily. (Patient not taking: Reported on 01/15/2019) 60 tablet 2  . haloperidol (HALDOL) 10 MG tablet Take 2 tablets (20 mg total) by mouth at bedtime. (Patient not taking: Reported on 01/15/2019) 60 tablet 2  . traZODone (DESYREL) 50 MG tablet Take 1 tablet (50 mg total) by mouth at bedtime as needed for sleep. (Patient not taking: Reported on 01/15/2019) 30 tablet 1    Musculoskeletal: Strength & Muscle Tone: within normal limits Gait & Station: normal Patient leans: N/A  Psychiatric Specialty Exam: Physical Exam  Nursing note and vitals reviewed. Constitutional: He is oriented to person, place, and time. He appears well-developed and well-nourished.  Cardiovascular: Normal rate.  Respiratory: Effort normal.  Musculoskeletal: Normal range of motion.  Neurological: He is alert and oriented to person, place, and time.  Skin: Skin is warm.    Review of Systems  Constitutional: Negative.   HENT: Negative.   Eyes: Negative.   Respiratory: Negative.   Cardiovascular: Negative.   Gastrointestinal: Negative.   Genitourinary: Negative.   Musculoskeletal: Negative.   Skin: Negative.   Neurological: Negative.   Endo/Heme/Allergies: Negative.   Psychiatric/Behavioral:       Irritable, pressured speech, tangential, pacing    Blood pressure (!) 150/86, pulse 67, temperature 98.4 F (36.9 C), temperature source Oral, resp. rate 16, height  (1.778 m), weight 77.1 kg, SpO2 99 %.Body mass index is 24.39 kg/m.  General Appearance: Guarded  Eye Contact:  Good  Speech:  Pressured  Volume:  Increased  Mood:   Irritable  Affect:  Congruent  Thought Process:  Linear and Descriptions of Associations: Tangential  Orientation:  Full (Time, Place, and Person)  Thought Content:  Illogical  Suicidal Thoughts:  No  Homicidal Thoughts:  No  Memory:  Immediate;   Good Recent;   Good Remote;   Good  Judgement:  Impaired  Insight:  Lacking  Psychomotor Activity:  Increased and Restlessness  Concentration:  Concentration: Poor  Recall:  Good  Fund of Knowledge:  Fair  Language:  Fair  Akathisia:  No  Handed:  Right  AIMS (if indicated):     Assets:  Resilience  ADL's:  Intact  Cognition:  WNL  Sleep:        Treatment Plan Summary: Daily contact with patient to assess and evaluate symptoms and progress in treatment and Medication management  Restart Haldol 10 mg PO BID Restart Cogentin 0.5 mg PO BID Restart Trazodone 50 mg   Disposition: Recommend psychiatric Inpatient admission when medically cleared.  Gerlene Burdockravis B Valissa Lyvers, FNP 01/15/2019 3:33 PM

## 2019-01-15 NOTE — ED Provider Notes (Signed)
[facility] Emergency Department Provider Note   ____________________________________________   First MD Initiated Contact with Patient 01/15/19 0105     (approximate)  I have reviewed the triage vital signs and the nursing notes.   HISTORY  Chief Complaint Psychiatric Evaluation  Level V caveat: limited by combativeness  HPI Todd Shaw is a 33 y.o. male brought to the ED from home via EMS and multiple [place] police officers under IVC.  Patient has a history of bipolar disorder who his mother states he has not been taking his medicines.  Arrives to the ED handcuffed with a spit mask over his face, yelling, tangential with pressured speech, bit to officers, kicking and yelling.       Past Medical History:  Diagnosis Date  . Bipolar 1 disorder (HCC)   . Hallucinations   . Psychosis [facility](HCC)     Patient Active Problem List   Diagnosis Date Noted  . Severe manic bipolar 1 disorder with psychotic behavior (HCC) 08/25/2018  . Compartment syndrome (HCC) 12/31/2016  . Cannabis use disorder, moderate, dependence (HCC) 11/11/2015  . Major depressive disorder, recurrent episode, severe, with psychotic behavior (HCC) 09/21/2015  . Attention deficit hyperactivity disorder (ADHD) 09/13/2015  . Tobacco use disorder 09/13/2015    Past Surgical History:  Procedure Laterality Date  . APPLICATION OF A-CELL OF EXTREMITY Right 01/08/2017   Procedure: APPLICATION OF A-CELL RIGHT LOWER LEG;  Surgeon: Glenna Fellowshimmappa, Brinda, MD;  Location: [facility] OR;  Service: Plastics;  Laterality: Right;  . APPLICATION OF WOUND VAC Right 01/01/2017   Procedure: RIGHT LEG WOUND VAC CHANGE with irrigation and DEBRIDEMENT of right leg fasciotomy site;  Surgeon: Larina EarthlyEarly, Todd F, MD;  [facility];  Service: Vascular;  Laterality: Right;  . APPLICATION OF WOUND VAC Right 01/03/2017   Procedure: WOUND VAC CHANGE;  Surgeon: Sherren KernsFields, Charles E, MD;  [facility];  Service: Vascular;  Laterality:  Right;  . FASCIOTOMY Right 12/31/2016   Procedure: FASCIOTOMY, RIGHT LOWER EXTREMITY COMPARTMENT;  Surgeon: Maeola Harmanain, Brandon Christopher, MD;  [facility];  Service: Vascular;  Laterality: Right;  . I&D EXTREMITY Right 01/03/2017   Procedure: RIGHT LOWER EXTREMITY FASCIOTOMY WASH-OUT. DEBRIDEMENT;  Surgeon: Sherren KernsFields, Charles E, MD;  [facility];  Service: Vascular;  Laterality: Right;  . SKIN SPLIT GRAFT Right 01/08/2017   Procedure: SKIN GRAFT SPLIT THICKNESS from right thigh to right leg;  Surgeon: Glenna Fellowshimmappa, Brinda, MD;  Location: [facility] OR;  Service: Plastics;  Laterality: Right;    Prior to Admission medications   Medication Sig Start Date End Date Taking? Authorizing Provider  benztropine (COGENTIN) 0.5 MG tablet Take 1 tablet (0.5 mg total) by mouth 2 (two) times daily. Patient not taking: Reported on 01/15/2019 08/27/18   Malvin JohnsFarah, Brian, MD  haloperidol (HALDOL) 10 MG tablet Take 2 tablets (20 mg total) by mouth at bedtime. Patient not taking: Reported on 01/15/2019 08/27/18   Malvin JohnsFarah, Brian, MD  traZODone (DESYREL) 50 MG tablet Take 1 tablet (50 mg total) by mouth at bedtime as needed for sleep. Patient not taking: Reported on 01/15/2019 08/27/18   Malvin JohnsFarah, Brian, MD    Allergies No known allergies  Family History  Problem Relation Age of Onset  . Mental illness Neg Hx     Social History Social History   Tobacco Use  . Smoking status: Current Every Day Smoker    Packs/day: 1.00    Types: Cigarettes  . Smokeless tobacco: Never Used  Substance Use Topics  . Alcohol use: No  .  Drug use: Yes    Types: Marijuana, Benzodiazepines    Review of Systems  Constitutional: No fever/chills Eyes: No visual changes. ENT: No sore throat. Cardiovascular: Denies chest pain. Respiratory: Denies shortness of breath. Gastrointestinal: No abdominal pain.  No nausea, no vomiting.  No diarrhea.  No constipation. Genitourinary: Negative for dysuria. Musculoskeletal: Negative for back pain. Skin:  Negative for rash. Neurological: Negative for headaches, focal weakness or numbness. Psychiatric:  Positive for agitation and combativeness.   ____________________________________________   PHYSICAL EXAM:  VITAL SIGNS: ED Triage Vitals  Enc Vitals Group     BP      Pulse      Resp      Temp      Temp src      SpO2      Weight      Height      Head Circumference      Peak Flow      Pain Score      Pain Loc      Pain Edu?      Excl. in GC?     Constitutional: In handcuffs with spit mask over face, yelling, combative, kicking. Eyes: Conjunctivae are normal. PERRL. EOMI. Head: Atraumatic. Nose: No congestion/rhinnorhea. Mouth/Throat: Mucous membranes are moist.  Oropharynx non-erythematous. Neck: No stridor.   Cardiovascular: Normal rate, regular rhythm. Grossly normal heart sounds.  Good peripheral circulation. Respiratory: Normal respiratory effort.  No retractions. Lungs CTAB. Gastrointestinal: Soft and nontender. No distention. No abdominal bruits. No CVA tenderness. Musculoskeletal: No lower extremity tenderness nor edema.  No joint effusions. Neurologic:  Normal speech and language. No gross focal neurologic deficits are appreciated.  Skin:  Skin is warm, dry and intact. No rash noted. Psychiatric: Mood and affect are agitated and combative. Speech and behavior are psychotic.  ____________________________________________   LABS (all labs ordered are listed, but only abnormal results are displayed)  Labs Reviewed  COMPREHENSIVE METABOLIC PANEL - Abnormal; Notable for the following components:      Result Value   Potassium 3.0 (*)    CO2 20 (*)    Glucose, Bld 123 (*)    Creatinine, Ser 1.26 (*)    AST 156 (*)    ALT 58 (*)    Total Bilirubin 1.4 (*)    All other components within normal limits  ACETAMINOPHEN LEVEL - Abnormal; Notable for the following components:   Acetaminophen (Tylenol), Serum <10 (*)    All other components within normal limits  URINE  DRUG SCREEN, QUALITATIVE ([facility] ONLY) - Abnormal; Notable for the following components:   Cannabinoid 50 Ng, Ur [place] POSITIVE (*)    All other components within normal limits  SARS CORONAVIRUS 2 (HOSPITAL ORDER, PERFORMED IN [facility])  CBC WITH DIFFERENTIAL/PLATELET  ETHANOL  SALICYLATE LEVEL   ____________________________________________  EKG  None ____________________________________________  RADIOLOGY  ED MD interpretation: None  Official radiology report(s): No results found.  ____________________________________________   PROCEDURES  Procedure(s) performed (including Critical Care):  Procedures  ----------------------------------------- 2:24 AM on 01/15/2019 -----------------------------------------  Behavioral Restraint Provider Note:  Behavioral Indicators: Danger to self and Danger to others   Reaction to intervention: accepting   Review of systems: No changes   History: H&P, medications and labs reviewed   Mental Status Exam: Sleeping in NAD  Restraint Continuation: Terminate  Patient asleep after IM [place].  Restraints have been terminated.  ____________________________________________   INITIAL IMPRESSION / ASSESSMENT AND PLAN / ED COURSE  As part of my medical  decision making, I reviewed the following data within the [place] notes reviewed and incorporated, Labs reviewed, Old chart reviewed, A consult was requested and obtained from this/these consultant(s) Psychiatry and Notes from prior ED visits     Todd Shaw was evaluated in Emergency Department on 01/15/2019 for the symptoms described in the history of present illness. He was evaluated in the context of the global COVID-19 pandemic, which necessitated consideration that the patient might be at risk for infection with the SARS-CoV-2 virus that causes COVID-19. Institutional protocols and algorithms that pertain to the evaluation of patients at  risk for COVID-19 are in a state of rapid change based on information released by regulatory bodies including the CDC and federal and state organizations. These policies and algorithms were followed during the patient's care in the ED.   33 year old male with bipolar disorder who is been off his medications and presents to the ED under IVC for psychosis.  Patient is extremely agitated, combative and violent.  Unable to be verbally redirected.  Requires IM calming agent.  Clinical Course as of Jan 14 553  Wed Jan 15, 2019  0553 Patient awake, cooperative, ambulating with steady gait.  Will replete potassium and patient will be transferred to the [facility] pending psychiatric evaluation and disposition.   [JS]    Clinical Course User Index [JS] Paulette Blanch, MD     ____________________________________________   FINAL CLINICAL IMPRESSION(S) / ED DIAGNOSES  Final diagnoses:  Acute psychosis ([place])  Hypokalemia  Marijuana abuse     ED Discharge Orders    None       Note:  This document was prepared using Dragon voice recognition software and may include unintentional dictation errors.   Paulette Blanch, MD 01/15/19 [phone]

## 2019-01-15 NOTE — ED Notes (Signed)
Pt has been cooperative for this shift and by report was in the prior. Another pt's behavior escalated to the extent that the pt needed isolation, so that patient was moved to room 8 and Todd Shaw was moved to room 2.

## 2019-01-15 NOTE — ED Notes (Signed)
Pt continues to sleep soundly. No aggression noted. VSS.

## 2019-01-15 NOTE — ED Notes (Signed)
Medication offered  - pt refused

## 2019-01-15 NOTE — ED Notes (Signed)
IVC/ Consult completed/ Plan to Admit

## 2019-01-16 MED ORDER — ZIPRASIDONE MESYLATE 20 MG IM SOLR: 20.0000 mg | Freq: Once | INTRAMUSCULAR | Status: DC

## 2019-01-16 NOTE — ED Notes (Signed)
Pt given meal tray.

## 2019-01-16 NOTE — Consult Note (Signed)
[facility] Face-to[facility]Face [facility] Consult   Reason for Consult:  Aggressive behavior and Bipolar I disorder Referring Physician:  EDP Patient Identification: Todd Shaw MRN:  [id number] Principal Diagnosis: Severe manic bipolar 1 disorder with psychotic behavior (HCC) Diagnosis:  Principal Problem:   Severe manic bipolar 1 disorder with psychotic behavior (HCC)   Total Time spent with patient: 30 minutes  Subjective:   Todd Finnerry Foell is a 33 y.o. male patient reports today that he is not suicidal nor homicidal and denies any hallucinations.  Patient states that he does not want take any medications and only medication he would take is what helped him sleep last night.  Patient continues to state that he is staying away from everybody and that he is around because they only send him back to the hospital.  Patient states that he stopped taking medications after his last hospitalization and he does not need them.  HPI:  32 y.o.malebrought to the ED from home via EMS and multiple [place] police officers under IVC. Patient has a history of bipolar disorder who his mother states he has not been taking his medicines. Arrives to the ED handcuffed with a spit mask over his face, yelling, tangential with pressured speech, bit to officers, kicking and yelling.  Patient is seen by me face-to-face.  Patient is tangential, pressured speech, and is intrusive during interview.  Patient is pacing around the room at the time.  It has been reported the patient was calm last night but the patient also took Haldol when he went to bed last night.  Patient is adamantly denying any agreement to take medication.  Patient's insight is extremely poor and appears to be agitated about staying at the hospital.  Based on patient's history feel the patient needs to be admitted again, however due to patient's aggression and agitation patient will probably need agitation protocol medications ordered and will need to be on a secured  unit. Patient meets inpatient criteria. I have consulted with Dr. Daleen Boavi and Geodon 20 mg IM once has been ordered.  Past Psychiatric History: MDD and bipolar disorder  Risk to Self: Suicidal Ideation: No Suicidal Intent: No Is patient at risk for suicide?: No Suicidal Plan?: No Access to Means: No What has been your use of drugs/alcohol within the last 12 months?: Cannabis How many times?: 0 Other Self Harm Risks: None Triggers for Past Attempts: None known Intentional Self Injurious Behavior: None Risk to Others: Homicidal Ideation: No Thoughts of Harm to Others: No Current Homicidal Intent: No Current Homicidal Plan: No Access to Homicidal Means: No Identified Victim: None History of harm to others?: No Assessment of Violence: On admission Violent Behavior Description: Pt was reportedly bitting, spitting, and kicking Does patient have access to weapons?: No Criminal Charges Pending?: Yes Describe Pending Criminal Charges:  Misdemeanor-FIRST DEG TRESP ENTER/REMAIN Does patient have a court date: Yes Court Date: 03/11/19 Prior Inpatient Therapy: Prior Inpatient Therapy: No Prior Outpatient Therapy: Prior Outpatient Therapy: No Does patient have an ACCT team?: No Does patient have Intensive In-House Services?  : No Does patient have [name] services? : No Does patient have P4CC services?: No  Past Medical History:  Past Medical History:  Diagnosis Date  . Bipolar 1 disorder (HCC)   . Hallucinations   . Psychosis [facility](HCC)     Past Surgical History:  Procedure Laterality Date  . APPLICATION OF A-CELL OF EXTREMITY Right 01/08/2017   Procedure: APPLICATION OF A-CELL RIGHT LOWER LEG;  Surgeon: Glenna Fellowshimmappa, Brinda, MD;  [facility];  Service: Clinical cytogeneticist;  Laterality: Right;  . APPLICATION OF WOUND VAC Right 01/01/2017   Procedure: RIGHT LEG WOUND VAC CHANGE with irrigation and DEBRIDEMENT of right leg fasciotomy site;  Surgeon: Rosetta Posner, MD;  Location: [place];  Service: Vascular;   Laterality: Right;  . APPLICATION OF WOUND VAC Right 01/03/2017   Procedure: WOUND VAC CHANGE;  Surgeon: Elam Dutch, MD;  Location: [place];  Service: Vascular;  Laterality: Right;  . FASCIOTOMY Right 12/31/2016   Procedure: FASCIOTOMY, RIGHT LOWER EXTREMITY COMPARTMENT;  Surgeon: Waynetta Sandy, MD;  Location: [place];  Service: Vascular;  Laterality: Right;  . I&D EXTREMITY Right 01/03/2017   Procedure: RIGHT LOWER EXTREMITY FASCIOTOMY WASH-OUT. DEBRIDEMENT;  Surgeon: Elam Dutch, MD;  Location: [place];  Service: Vascular;  Laterality: Right;  . SKIN SPLIT GRAFT Right 01/08/2017   Procedure: SKIN GRAFT SPLIT THICKNESS from right thigh to right leg;  Surgeon: [name], MD;  Location: [place];  Service: Plastics;  Laterality: Right;   Family History:  Family History  Problem Relation Age of Onset  . Mental illness Neg Hx    Family Psychiatric  History: Denies Social History:  Social History   Substance and Sexual Activity  Alcohol Use No     Social History   Substance and Sexual Activity  Drug Use Yes  . Types: Marijuana, Benzodiazepines    Social History   Socioeconomic History  . Marital status: Single    Spouse name: Not on file  . Number of children: Not on file  . Years of education: Not on file  . Highest education level: Not on file  Occupational History  . Not on file  Social Needs  . Financial resource strain: Not on file  . Food insecurity    Worry: Not on file    Inability: Not on file  . Transportation needs    Medical: Not on file    Non-medical: Not on file  Tobacco Use  . Smoking status: Current Every Day Smoker    Packs/day: 1.00    Types: Cigarettes  . Smokeless tobacco: Never Used  Substance and Sexual Activity  . Alcohol use: No  . Drug use: Yes    Types: Marijuana, Benzodiazepines  . Sexual activity: Yes  Lifestyle  . Physical activity    Days per week: Not on file    Minutes per session: Not on file  . Stress: Not  on file  Relationships  . Social Herbalist on phone: Not on file    Gets together: Not on file    Attends religious service: Not on file    Active member of club or organization: Not on file    Attends meetings of clubs or organizations: Not on file    Relationship status: Not on file  Other Topics Concern  . Not on file  Social History Narrative  . Not on file   Additional Social History:    Allergies:   Allergies  Allergen Reactions  . No Known Allergies     Labs:  Results for orders placed or performed during the hospital encounter of 01/15/19 (from the past 48 hour(s))  Urine Drug Screen, Qualitative     Status: Abnormal   Collection Time: 01/15/19  1:07 AM  Result Value Ref Range   Tricyclic, Ur Screen NONE DETECTED NONE DETECTED   Amphetamines, Ur Screen NONE DETECTED NONE DETECTED   MDMA (Ecstasy)Ur Screen NONE DETECTED NONE DETECTED  Cocaine Metabolite,Ur [place] NONE DETECTED NONE DETECTED   Opiate, Ur Screen NONE DETECTED NONE DETECTED   Phencyclidine (PCP) Ur S NONE DETECTED NONE DETECTED   Cannabinoid 50 Ng, Ur [place] POSITIVE (A) NONE DETECTED   Barbiturates, Ur Screen NONE DETECTED NONE DETECTED   Benzodiazepine, Ur Scrn NONE DETECTED NONE DETECTED   Methadone Scn, Ur NONE DETECTED NONE DETECTED    Comment: (NOTE) Tricyclics + metabolites, urine    Cutoff 1000 ng/mL Amphetamines + metabolites, urine  Cutoff 1000 ng/mL MDMA (Ecstasy), urine              Cutoff 500 ng/mL Cocaine Metabolite, urine          Cutoff 300 ng/mL Opiate + metabolites, urine        Cutoff 300 ng/mL Phencyclidine (PCP), urine         Cutoff 25 ng/mL Cannabinoid, urine                 Cutoff 50 ng/mL Barbiturates + metabolites, urine  Cutoff 200 ng/mL Benzodiazepine, urine              Cutoff 200 ng/mL Methadone, urine                   Cutoff 300 ng/mL The urine drug screen provides only a preliminary, unconfirmed analytical test result and should not be used for  non-medical purposes. Clinical consideration and professional judgment should be applied to any positive drug screen result due to possible interfering substances. A more specific alternate chemical method must be used in order to obtain a confirmed analytical result. Gas chromatography / mass spectrometry (GC/MS) is the preferred confirmat ory method. Performed at [facility], [address] [place], [place] [address]   CBC with Differential     Status: None   Collection Time: 01/15/19  1:35 AM  Result Value Ref Range   WBC 8.1 4.0 - 10.5 K/uL   RBC 4.50 4.22 - 5.81 MIL/uL   Hemoglobin 14.1 13.0 - 17.0 g/dL   HCT 19.1 47.8 - 29.5 %   MCV 89.6 80.0 - 100.0 fL   MCH 31.3 26.0 - 34.0 pg   MCHC 35.0 30.0 - 36.0 g/dL   RDW 62.1 30.8 - 65.7 %   Platelets 219 150 - 400 K/uL   nRBC 0.0 0.0 - 0.2 %   Neutrophils Relative % 71 %   Neutro Abs 5.7 1.7 - 7.7 K/uL   Lymphocytes Relative 18 %   Lymphs Abs 1.5 0.7 - 4.0 K/uL   Monocytes Relative 11 %   Monocytes Absolute 0.9 0.1 - 1.0 K/uL   Eosinophils Relative 0 %   Eosinophils Absolute 0.0 0.0 - 0.5 K/uL   Basophils Relative 0 %   Basophils Absolute 0.0 0.0 - 0.1 K/uL   Immature Granulocytes 0 %   Abs Immature Granulocytes 0.02 0.00 - 0.07 K/uL    Comment: Performed at [facility], [address] [place], [place] [address]  Comprehensive metabolic panel     Status: Abnormal   Collection Time: 01/15/19  1:35 AM  Result Value Ref Range   Sodium 136 135 - 145 mmol/L   Potassium 3.0 (L) 3.5 - 5.1 mmol/L   Chloride 101 98 - 111 mmol/L   CO2 20 (L) 22 - 32 mmol/L   Glucose, Bld 123 (H) 70 - 99 mg/dL   BUN 19 6 - 20 mg/dL   Creatinine, Ser 2.95 (H) 0.61 - 1.24 mg/dL   Calcium  8.9 8.9 - 10.3 mg/dL   Total Protein 7.7 6.5 - 8.1 g/dL   Albumin 4.6 3.5 - 5.0 g/dL   AST 409 (H) 15 - 41 U/L   ALT 58 (H) 0 - 44 U/L   Alkaline Phosphatase 55 38 - 126 U/L   Total Bilirubin 1.4 (H) 0.3 - 1.2 mg/dL   GFR calc non Af  Amer >60 >60 mL/min   GFR calc Af Amer >60 >60 mL/min   Anion gap 15 5 - 15    Comment: Performed at [facility], [address] [place], [place] [address]  Ethanol     Status: None   Collection Time: 01/15/19  1:35 AM  Result Value Ref Range   Alcohol, Ethyl (B) <10 <10 mg/dL    Comment: (NOTE) Lowest detectable limit for serum alcohol is 10 mg/dL. For medical purposes only. Performed at [facility], [address] [place], [place] [address]   Acetaminophen level     Status: Abnormal   Collection Time: 01/15/19  1:35 AM  Result Value Ref Range   Acetaminophen (Tylenol), Serum <10 (L) 10 - 30 ug/mL    Comment: (NOTE) Therapeutic concentrations vary significantly. A range of 10-30 ug/mL  may be an effective concentration for many patients. However, some  are best treated at concentrations outside of this range. Acetaminophen concentrations >150 ug/mL at 4 hours after ingestion  and >50 ug/mL at 12 hours after ingestion are often associated with  toxic reactions. Performed at [facility], [address] [place], [place] [address]   Salicylate level     Status: None   Collection Time: 01/15/19  1:35 AM  Result Value Ref Range   Salicylate Lvl <7.0 2.8 - 30.0 mg/dL    Comment: Performed at [facility], [address] [place], [place] [address]  SARS Coronavirus 2 (CEPHEID - Performed in [facility] hospital lab), Hosp Order     Status: None   Collection Time: 01/15/19  6:02 AM   Specimen: Nasopharyngeal Swab  Result Value Ref Range   SARS Coronavirus 2 NEGATIVE NEGATIVE    Comment: (NOTE) If result is NEGATIVE SARS-CoV-2 target nucleic acids are NOT DETECTED. The SARS-CoV-2 RNA is generally detectable in upper and lower  respiratory specimens during the acute phase of infection. The lowest  concentration of SARS-CoV-2 viral copies this assay can detect is 250  copies / mL. A negative result does not preclude SARS-CoV-2 infection   and should not be used as the sole basis for treatment or other  patient management decisions.  A negative result may occur with  improper specimen collection / handling, submission of specimen other  than nasopharyngeal swab, presence of viral mutation(s) within the  areas targeted by this assay, and inadequate number of viral copies  (<250 copies / mL). A negative result must be combined with clinical  observations, patient history, and epidemiological information. If result is POSITIVE SARS-CoV-2 target nucleic acids are DETECTED. The SARS-CoV-2 RNA is generally detectable in upper and lower  respiratory specimens dur ing the acute phase of infection.  Positive  results are indicative of active infection with SARS-CoV-2.  Clinical  correlation with patient history and other diagnostic information is  necessary to determine patient infection status.  Positive results do  not rule out bacterial infection or co-infection with other viruses. If result is PRESUMPTIVE POSTIVE SARS-CoV-2 nucleic acids MAY BE PRESENT.   A presumptive positive result was obtained on the submitted specimen  and  confirmed on repeat testing.  While 2019 novel coronavirus  (SARS-CoV-2) nucleic acids may be present in the submitted sample  additional confirmatory testing may be necessary for epidemiological  and / or clinical management purposes  to differentiate between  SARS-CoV-2 and other Sarbecovirus currently known to infect humans.  If clinically indicated additional testing with an alternate test  methodology [phone] is advised. The SARS-CoV-2 RNA is generally  detectable in upper and lower respiratory sp ecimens during the acute  phase of infection. The expected result is Negative. Fact Sheet for Patients:  BoilerBrush.com.cyhttps://www.fda.gov/media/136312/download Fact Sheet for Healthcare Providers: https://pope.com/https://www.fda.gov/media/136313/download This test is not yet approved or cleared by the [place] FDA  and has been authorized for detection and/or diagnosis of SARS-CoV-2 by FDA under an Emergency Use Authorization (EUA).  This EUA will remain in effect (meaning this test can be used) for the duration of the COVID-19 declaration under Section 564(b)(1) of the Act, 21 U.S.C. section 360bbb-3(b)(1), unless the authorization is terminated or revoked sooner. Performed at [facility], [address] [place], [place] [address]     Current Facility-Administered Medications  Medication Dose Route Frequency Provider Last Rate Last Dose  . benztropine (COGENTIN) tablet 0.5 mg  0.5 mg Oral BID Dacari Beckstrand, Feliz Beamravis B, FNP   0.5 mg at 01/15/19 2130  . diphenhydrAMINE (BENADRYL) capsule 50 mg  50 mg Oral Q6H PRN Marylene Masek, Gerlene Burdockravis B, FNP       Or  . diphenhydrAMINE (BENADRYL) injection 50 mg  50 mg Intramuscular Q6H PRN Kayden Hutmacher, Feliz Beamravis B, FNP      . haloperidol (HALDOL) tablet 10 mg  10 mg Oral BID Delsie Amador, Gerlene Burdockravis B, FNP   10 mg at 01/15/19 2130  . haloperidol (HALDOL) tablet 5 mg  5 mg Oral Q6H PRN Qusai Kem, Gerlene Burdockravis B, FNP       Or  . haloperidol lactate (HALDOL) injection 5 mg  5 mg Intramuscular Q6H PRN Jacqueline Delapena, Feliz Beamravis B, FNP      . ibuprofen (ADVIL) tablet 600 mg  600 mg Oral Q8H PRN Shabazz Mckey, Gerlene Burdockravis B, FNP   600 mg at 01/16/19 1024  . LORazepam (ATIVAN) tablet 2 mg  2 mg Oral Q6H PRN Emonnie Cannady, Gerlene Burdockravis B, FNP       Or  . LORazepam (ATIVAN) injection 2 mg  2 mg Intramuscular Q6H PRN Darlys Buis, Feliz Beamravis B, FNP      . traZODone (DESYREL) tablet 50 mg  50 mg Oral QHS PRN Alcide Memoli, Gerlene Burdockravis B, FNP      . ziprasidone (GEODON) injection 20 mg  20 mg Intramuscular Once Haneef Hallquist, Gerlene Burdockravis B, FNP       Current Outpatient Medications  Medication Sig Dispense Refill  . benztropine (COGENTIN) 0.5 MG tablet Take 1 tablet (0.5 mg total) by mouth 2 (two) times daily. (Patient not taking: Reported on 01/15/2019) 60 tablet 2  . haloperidol (HALDOL) 10 MG tablet Take 2 tablets (20 mg total) by mouth at bedtime. (Patient not taking: Reported on  01/15/2019) 60 tablet 2  . traZODone (DESYREL) 50 MG tablet Take 1 tablet (50 mg total) by mouth at bedtime as needed for sleep. (Patient not taking: Reported on 01/15/2019) 30 tablet 1    Musculoskeletal: Strength & Muscle Tone: within normal limits Gait & Station: normal Patient leans: N/A  Psychiatric Specialty Exam: Physical Exam  Nursing note and vitals reviewed. Constitutional: He is oriented to person, place, and time. He appears well-developed and well-nourished.  Cardiovascular: Normal rate.  Respiratory: Effort normal.  Musculoskeletal: Normal range  of motion.  Neurological: He is alert and oriented to person, place, and time.  Skin: Skin is warm.    Review of Systems  Constitutional: Negative.   HENT: Negative.   Eyes: Negative.   Respiratory: Negative.   Cardiovascular: Negative.   Gastrointestinal: Negative.   Genitourinary: Negative.   Musculoskeletal: Negative.   Skin: Negative.   Neurological: Negative.   Endo/Heme/Allergies: Negative.   Psychiatric/Behavioral: Negative.     Blood pressure 133/84, pulse 95, temperature 98 F (36.7 C), temperature source Oral, resp. rate 20, height 5\' 10"  (1.778 m), weight 77.1 kg, SpO2 100 %.Body mass index is 24.39 kg/m.  General Appearance: Casual  Eye Contact:  Good  Speech:  Pressured  Volume:  Decreased  Mood:  Irritable  Affect:  Congruent  Thought Process:  Goal Directed and Descriptions of Associations: Intact  Orientation:  Full (Time, Place, and Person)  Thought Content:  Illogical  Suicidal Thoughts:  No  Homicidal Thoughts:  No  Memory:  Immediate;   Good Recent;   Fair Remote;   Fair  Judgement:  Impaired  Insight:  Lacking  Psychomotor Activity:  Normal  Concentration:  Concentration: Fair and Attention Span: Fair  Recall:  Good  Fund of Knowledge:  Fair  Language:  Fair  Akathisia:  No  Handed:  Right  AIMS (if indicated):     Assets:  Resilience  ADL's:  Intact  Cognition:  WNL  Sleep:         Treatment Plan Summary: Daily contact with patient to assess and evaluate symptoms and progress in treatment and Medication management  Geodon 20 mg IM once ordered Continue Haldol 5 mg IM or PO, Ativan 2 mg IM or PO, and Diphenhydramine 50 mg IM or PO Q6H PRN for psychosis, severe agitation/anxiety Continue Trazodone 50 mg PO QHS PRN Continue Haldol 10 mg PO BID  Disposition: Recommend psychiatric Inpatient admission when medically cleared.  Gerlene Burdockravis B Caillou Minus, FNP 01/16/2019 10:49 AM

## 2019-01-16 NOTE — ED Notes (Signed)
Pt earlier went into another male pt's room. He said he was trying to help him. Pt was instructed not to go into other patient's rooms. He has been up to the nurses' station multiple times, requesting soda, and has been instructed (repetition needed) that sodas are not distributed after 2130. Pt has been provided water multiple times. He remains calm/cooperative.

## 2019-01-16 NOTE — ED Notes (Signed)
Report received from [facility]. Upon approach, pt was friendly with an elevated affect and loud/pressured speech. Pt perseverated about his perceptions of how his mother has treated him, how nothing is wrong with him, and how he is refusing medications because they make it difficult to fulfill his work obligations. Patient does not believe he is experiencing mania. Emotional support provided. Will continue to monitor for needs/safety.

## 2019-01-16 NOTE — ED Notes (Signed)
Pt using phone at this time.  

## 2019-01-16 NOTE — ED Notes (Signed)
Pt taking shower at this time and given clean scrubs.

## 2019-01-16 NOTE — ED Notes (Signed)
Psych and TTS at bedside. 

## 2019-01-16 NOTE — ED Notes (Signed)
Pt's hs meds given early, per Ysidro Evert PMHNP. Pt is yelling, irate about medications ordered. "Y'all keep listening to my mama! I don't fuck with her."

## 2019-01-16 NOTE — BH Assessment (Signed)
Referral information for Psychiatric Hospitalization faxed to;   [facility] [phone]),   . [facility] ([phone]),  . [facility] [phone], [phone], [phone] or [phone]),   . [place] [phone] or [phone])  . [facility] [phone]),   . [place] [phone]),   . [facility] [phone]).  [facility] [phone])

## 2019-01-16 NOTE — ED Notes (Signed)
IVC/Pending Placement referrals faxed out

## 2019-01-16 NOTE — ED Notes (Signed)
Pt given meal tray and a soda.  

## 2019-01-16 NOTE — BH Assessment (Signed)
Writer spoke with patient to complete updated/reassessment. Patient speech is pressured and rambling. He states he do not want to be inpatient. He spoke about his mother and it wasn't related to the conversation. Patient continues to be psychotic and refusing medications.

## 2019-01-16 NOTE — ED Notes (Signed)
Pt refused meds, TTS and psych NP at bedside when refused. PT encouraged to take meds but continues to refuse

## 2019-01-16 NOTE — ED Provider Notes (Signed)
-----------------------------------------   4:54 AM on 01/16/2019 -----------------------------------------   Blood pressure 133/84, pulse 95, temperature 98 F (36.7 C), temperature source Oral, resp. rate 20, height 5\' 10"  (1.778 m), weight 77.1 kg, SpO2 100 %.  The patient is calm and cooperative at this time.  There have been no acute events since the last update.  Awaiting disposition plan from Behavioral Medicine and/or Social Work team(s).    Harvest Dark, MD 01/16/19 [phone]

## 2019-01-17 MED ORDER — HALOPERIDOL DECANOATE 100 MG/ML IM SOLN
100.0000 mg | Freq: Once | INTRAMUSCULAR | Status: AC
  Administered 2019-01-17: 14:00:00 100 mg via INTRAMUSCULAR
  Filled 2019-01-17: qty 1

## 2019-01-17 MED ORDER — HALOPERIDOL 5 MG PO TABS
10.0000 mg | ORAL_TABLET | Freq: Two times a day (BID) | ORAL | Status: DC
  Administered 2019-01-17 – 2019-01-18 (×2): 10 mg via ORAL
  Filled 2019-01-17 (×2): qty 2

## 2019-01-17 MED ORDER — HALOPERIDOL LACTATE 5 MG/ML IJ SOLN: 10.0000 mg | Freq: Two times a day (BID) | INTRAMUSCULAR | Status: DC

## 2019-01-17 NOTE — ED Notes (Signed)
Pt provided with blanket

## 2019-01-17 NOTE — ED Notes (Signed)
Pt provided with orange juice per RN approval, pt tone consistently changing stating "I ain't with all this bullshit"

## 2019-01-17 NOTE — ED Notes (Signed)
Pt given meal tray.

## 2019-01-17 NOTE — ED Notes (Signed)
Pt was given a food tray

## 2019-01-17 NOTE — ED Notes (Signed)
BEHAVIORAL HEALTH ROUNDING Patient sleeping: No. Patient alert and oriented: yes Behavior appropriate: Yes.  ; If no, describe:  Nutrition and fluids offered: yes Toileting and hygiene offered: Yes  Sitter present: q15 minute observations and security camera monitoring Law enforcement present: Yes  ODS  

## 2019-01-17 NOTE — ED Notes (Signed)
Pt standing in front of security officer banging on the glass - report stopped so that I may address his needs  He states  "I don't need nothing - I am ready to go today - can I get a drink - what time is breakfast?  Pt reassured   Drink and snack provided

## 2019-01-17 NOTE — ED Notes (Signed)
Meal tray provided to pt with sprite, pt has no further needs or concerns at this time, will continue to monitor through security cameras and q 15 min checks  

## 2019-01-17 NOTE — ED Notes (Signed)
Pt took a shower and changed scrubs.

## 2019-01-17 NOTE — ED Notes (Signed)
Pt asking for something to drink, pt advised that he can only have water to drink, pt is becoming agitated, grunting at the door and doing push-ups, RN notified and security watching pt on camera's

## 2019-01-17 NOTE — ED Notes (Signed)
IVC/Pending Placement 

## 2019-01-17 NOTE — ED Notes (Signed)
Pt. Is up walking around in dayroom.  Pt. Appears to be in a good mood and said good bye to outgoing nurse.  Pt. Happy he was able to get his monthly IM psych. Medication.  Pt. States, "I will be able to work now".  Pt. Calm and cooperative.

## 2019-01-17 NOTE — Consult Note (Signed)
[facility]   Reason for Consult:  Aggressive behavior and Bipolar I disorder Referring Physician:  EDP Patient Identification: Todd Shaw MRN:  [id number] Principal Diagnosis: Severe manic bipolar 1 disorder with psychotic behavior (HCC) Diagnosis:  Principal Problem:   Severe manic bipolar 1 disorder with psychotic behavior (HCC)  Total Time spent with patient: 30 minutes  Subjective:   Todd Shaw is a 33 y.o. male patient reports today that he is not suicidal nor homicidal and denies any hallucinations.  Patient agreeable to take Haldol decanoate as he feels this will prevent people from sending him to the hospital.  "If I leave can you write my note to go back to work?"    HPI:  32 y.o.malebrought to the ED from home via EMS and multiple [place] police officers under IVC. Patient has a history of bipolar disorder who his mother states he has not been taking his medicines. Arrives to the ED handcuffed with a spit mask over his face, yelling, tangential with pressured speech, bit to officers, kicking and yelling.  Patient is seen by me face-to-face.  Patient appears to be at his baseline, no pressured speech or tangential thought processes.  He is agreeable to take haldol decanoate without issues.  Pleasant and engaging appropriately in the milieu.  Focused on discharging.  No suicidal/homicidal ideations, hallucinations, or substance abuse.  Will continue to assess behaviors for stability.  Past Psychiatric History: Bipolar disorder  Risk to Self: Suicidal Ideation: No Suicidal Intent: No Is patient at risk for suicide?: No Suicidal Plan?: No Access to Means: No What has been your use of drugs/alcohol within the last 12 months?: Cannabis How many times?: 0 Other Self Harm Risks: None Triggers for Past Attempts: None known Intentional Self Injurious Behavior: None Risk to Others: Homicidal Ideation: No Thoughts of Harm to Others: No Current Homicidal  Intent: No Current Homicidal Plan: No Access to Homicidal Means: No Identified Victim: None History of harm to others?: No Assessment of Violence: On admission Violent Behavior Description: Pt was reportedly bitting, spitting, and kicking Does patient have access to weapons?: No Criminal Charges Pending?: Yes Describe Pending Criminal Charges:  Misdemeanor-FIRST DEG TRESP ENTER/REMAIN Does patient have a court date: Yes Court Date: 03/11/19 Prior Inpatient Therapy: Prior Inpatient Therapy: No Prior Outpatient Therapy: Prior Outpatient Therapy: No Does patient have an ACCT team?: No Does patient have Intensive In-House Services?  : No Does patient have [name] services? : No Does patient have P4CC services?: No  Past Medical History:  Past Medical History:  Diagnosis Date  . Bipolar 1 disorder (HCC)   . Hallucinations   . Psychosis [facility](HCC)     Past Surgical History:  Procedure Laterality Date  . APPLICATION OF A-CELL OF EXTREMITY Right 01/08/2017   Procedure: APPLICATION OF A-CELL RIGHT LOWER LEG;  Surgeon: Glenna Fellowshimmappa, Brinda, MD;  Location: [facility] OR;  Service: Plastics;  Laterality: Right;  . APPLICATION OF WOUND VAC Right 01/01/2017   Procedure: RIGHT LEG WOUND VAC CHANGE with irrigation and DEBRIDEMENT of right leg fasciotomy site;  Surgeon: Larina EarthlyEarly, Todd F, MD;  [facility];  Service: Vascular;  Laterality: Right;  . APPLICATION OF WOUND VAC Right 01/03/2017   Procedure: WOUND VAC CHANGE;  Surgeon: Sherren KernsFields, Charles E, MD;  [facility];  Service: Vascular;  Laterality: Right;  . FASCIOTOMY Right 12/31/2016   Procedure: FASCIOTOMY, RIGHT LOWER EXTREMITY COMPARTMENT;  Surgeon: Maeola Harmanain, Brandon Christopher, MD;  [facility];  Service: Vascular;  Laterality: Right;  .  I&D EXTREMITY Right 01/03/2017   Procedure: RIGHT LOWER EXTREMITY FASCIOTOMY WASH-OUT. DEBRIDEMENT;  Surgeon: Sherren KernsFields, Charles E, MD;  [facility];  Service: Vascular;  Laterality: Right;  . SKIN SPLIT GRAFT Right  01/08/2017   Procedure: SKIN GRAFT SPLIT THICKNESS from right thigh to right leg;  Surgeon: Glenna Fellowshimmappa, Brinda, MD;  Location: [facility] OR;  Service: Plastics;  Laterality: Right;   Family History:  Family History  Problem Relation Age of Onset  . Mental illness Neg Hx    Family Psychiatric  History: Denies Social History:  Social History   Substance and Sexual Activity  Alcohol Use No     Social History   Substance and Sexual Activity  Drug Use Yes  . Types: Marijuana, Benzodiazepines    Social History   Socioeconomic History  . Marital status: Single    Spouse name: Not on file  . Number of children: Not on file  . Years of education: Not on file  . Highest education level: Not on file  Occupational History  . Not on file  Social Needs  . Financial resource strain: Not on file  . Food insecurity    Worry: Not on file    Inability: Not on file  . Transportation needs    Medical: Not on file    Non-medical: Not on file  Tobacco Use  . Smoking status: Current Every Day Smoker    Packs/day: 1.00    Types: Cigarettes  . Smokeless tobacco: Never Used  Substance and Sexual Activity  . Alcohol use: No  . Drug use: Yes    Types: Marijuana, Benzodiazepines  . Sexual activity: Yes  Lifestyle  . Physical activity    Days per week: Not on file    Minutes per session: Not on file  . Stress: Not on file  Relationships  . Social Musicianconnections    Talks on phone: Not on file    Gets together: Not on file    Attends religious service: Not on file    Active member of club or organization: Not on file    Attends meetings of clubs or organizations: Not on file    Relationship status: Not on file  Other Topics Concern  . Not on file  Social History Narrative  . Not on file   Additional Social History:    Allergies:   Allergies  Allergen Reactions  . No Known Allergies     Labs:  No results found for this or any previous visit (from the past 48 hour(s)).  Current  Facility-Administered Medications  Medication Dose Route Frequency Provider Last Rate Last Dose  . benztropine (COGENTIN) tablet 0.5 mg  0.5 mg Oral BID Money, Feliz Beamravis B, FNP   0.5 mg at 01/17/19 1017  . diphenhydrAMINE (BENADRYL) capsule 50 mg  50 mg Oral Q6H PRN Money, Gerlene Burdockravis B, FNP   50 mg at 01/16/19 1955   Or  . diphenhydrAMINE (BENADRYL) injection 50 mg  50 mg Intramuscular Q6H PRN Money, Feliz Beamravis B, FNP      . haloperidol (HALDOL) tablet 10 mg  10 mg Oral BID Charm RingsLord, Victoriano Campion Y, NP   10 mg at 01/17/19 1018   Or  . haloperidol lactate (HALDOL) injection 10 mg  10 mg Intramuscular BID Charm RingsLord, Mivaan Corbitt Y, NP      . haloperidol (HALDOL) tablet 5 mg  5 mg Oral Q6H PRN Money, Gerlene Burdockravis B, FNP       Or  . haloperidol lactate (HALDOL) injection 5 mg  5 mg Intramuscular Q6H PRN Money, Darnelle Maffucci B, FNP      . ibuprofen (ADVIL) tablet 600 mg  600 mg Oral Q8H PRN Money, Lowry Ram, FNP   600 mg at 01/17/19 1155  . LORazepam (ATIVAN) tablet 2 mg  2 mg Oral Q6H PRN Money, Lowry Ram, FNP       Or  . LORazepam (ATIVAN) injection 2 mg  2 mg Intramuscular Q6H PRN Money, Lowry Ram, FNP      . traZODone (DESYREL) tablet 50 mg  50 mg Oral QHS PRN Money, Lowry Ram, FNP   50 mg at 01/16/19 1955   Current Outpatient Medications  Medication Sig Dispense Refill  . benztropine (COGENTIN) 0.5 MG tablet Take 1 tablet (0.5 mg total) by mouth 2 (two) times daily. (Patient not taking: Reported on 01/15/2019) 60 tablet 2  . haloperidol (HALDOL) 10 MG tablet Take 2 tablets (20 mg total) by mouth at bedtime. (Patient not taking: Reported on 01/15/2019) 60 tablet 2  . traZODone (DESYREL) 50 MG tablet Take 1 tablet (50 mg total) by mouth at bedtime as needed for sleep. (Patient not taking: Reported on 01/15/2019) 30 tablet 1    Musculoskeletal: Strength & Muscle Tone: within normal limits Gait & Station: normal Patient leans: N/A  Psychiatric Specialty Exam: Physical Exam  Nursing note and vitals reviewed. Constitutional: He is oriented  to person, place, and time. He appears well-developed and well-nourished.  Cardiovascular: Normal rate.  Respiratory: Effort normal.  Musculoskeletal: Normal range of motion.  Neurological: He is alert and oriented to person, place, and time.  Skin: Skin is warm.  Psychiatric: His speech is normal and behavior is normal. Judgment and thought content normal. His mood appears anxious. His affect is blunt. Cognition and memory are normal.    Review of Systems  Constitutional: Negative.   HENT: Negative.   Eyes: Negative.   Respiratory: Negative.   Cardiovascular: Negative.   Gastrointestinal: Negative.   Genitourinary: Negative.   Musculoskeletal: Negative.   Skin: Negative.   Neurological: Negative.   Endo/Heme/Allergies: Negative.   Psychiatric/Behavioral: The patient is nervous/anxious.     Blood pressure (!) 150/82, pulse 72, temperature 98 F (36.7 C), temperature source Oral, resp. rate 18, height 5\' 10"  (1.778 m), weight 77.1 kg, SpO2 99 %.Body mass index is 24.39 kg/m.  General Appearance: Casual  Eye Contact:  Good  Speech:  Normal  Volume:  Normal  Mood:  Anxious at times  Affect:  Congruent  Thought Process:  Goal Directed and Descriptions of Associations: Intact  Orientation:  Full (Time, Place, and Person)  Thought Content:  Clear and coherent  Suicidal Thoughts:  No  Homicidal Thoughts:  No  Memory:  Immediate;   Good Recent;   Fair Remote;   Fair  Judgement:  Fair  Insight:  Fair  Psychomotor Activity:  Normal  Concentration:  Concentration: Fair and Attention Span: Fair  Recall:  Good  Fund of Knowledge:  Fair  Language:  Fair  Akathisia:  No  Handed:  Right  AIMS (if indicated):     Assets:  Resilience  ADL's:  Intact  Cognition:  WNL  Sleep:        Treatment Plan Summary: Daily contact with patient to assess and evaluate symptoms and progress in treatment and Medication management  Bipolar affective disorder, mania, severe with  psychosis: Continue Haldol 10  Mg BID Started Haldol deconnate 100 mg once monthly Continue Haldol 5 mg IM or PO, Ativan 2 mg IM or  PO, and Diphenhydramine 50 mg IM or PO Q6H PRN for psychosis, severe agitation/anxiety  Insomnia: Continue Trazodone 50 mg PO QHS PRN  Disposition: Recommend psychiatric Inpatient admission when medically cleared.  Reevaluate for stability tomorrow if not accepted anywhere, r/t calm and cooperative for over 24 hours and long term antipsychotic started  Nanine MeansJamison Aubry Rankin, NP 01/17/2019 5:55 PM

## 2019-01-17 NOTE — BH Assessment (Signed)
Writer spoke with patient to complete updated/reassessment. Patient states he wants to go home. He reports he's okay and "don't belong in here [facility]). I'm the best one (patient) in here. 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 But I don't mean that in a mean way..." At times, patient continues to be tangential and rambles. When answering questions, majority of what he says doesn't relate to the question. He denies SI/HI and AV/H and he's not as intrusive, in comparison to the day before.

## 2019-01-17 NOTE — ED Notes (Signed)
PT up to door to ask for a drink. This Rn fixed pt a cup of water to which pt states "I don't want no sink water". PT informed that sink water is all we have. PT states that water can be thrown out and he returns back to bed.

## 2019-01-17 NOTE — ED Notes (Signed)
PT given cup of water.

## 2019-01-17 NOTE — ED Notes (Signed)
BEHAVIORAL HEALTH ROUNDING Patient sleeping: No. Patient alert and oriented: yes Behavior appropriate: Yes  Anxious  excitable.  ; If no, describe:  Nutrition and fluids offered: yes Toileting and hygiene offered: Yes  Sitter present: q15 minute observations and security camera monitoring Law enforcement present: Yes  ODS  ENVIRONMENTAL ASSESSMENT Potentially harmful objects out of patient reach: Yes.   Personal belongings secured: Yes.   Patient dressed in hospital provided attire only: Yes.   Plastic bags out of patient reach: Yes.   Patient care equipment (cords, cables, call bells, lines, and drains) shortened, removed, or accounted for: Yes.   Equipment and supplies removed from bottom of stretcher: Yes.   Potentially toxic materials out of patient reach: Yes.   Sharps container removed or out of patient reach: Yes.

## 2019-01-17 NOTE — ED Notes (Signed)
ED BHU Datil Is the patient under IVC or is there intent for IVC: Yes.   Is the patient medically cleared: Yes.   Is there vacancy in the ED BHU: Yes.   Is the population mix appropriate for patient: Yes.   Is the patient awaiting placement in inpatient or outpatient setting: Yes.   Has the patient had a psychiatric consult: Yes.  Reassessment pending  Survey of unit performed for contraband, proper placement and condition of furniture, tampering with fixtures in bathroom, shower, and each patient room: Yes.  ; Findings:  APPEARANCE/BEHAVIOR Calm and cooperative NEURO ASSESSMENT Orientation: oriented x3  Denies pain Hallucinations: No.None noted (Hallucinations) denies  Speech: Normal Gait: normal RESPIRATORY ASSESSMENT Even  Unlabored respirations  CARDIOVASCULAR ASSESSMENT Pulses equal   regular rate  Skin warm and dry   GASTROINTESTINAL ASSESSMENT no GI complaint EXTREMITIES Full ROM  PLAN OF CARE Provide calm/safe environment. Vital signs assessed twice daily. ED BHU Assessment once each 12-hour shift. Collaborate with TTS daily or as condition indicates. Assure the ED provider has rounded once each shift. Provide and encourage hygiene. Provide redirection as needed. Assess for escalating behavior; address immediately and inform ED provider.  Assess family dynamic and appropriateness for visitation as needed: Yes.  ; If necessary, describe findings:  Educate the patient/family about BHU procedures/visitation: Yes.  ; If necessary, describe findings:

## 2019-01-17 NOTE — ED Notes (Signed)
Pt was given the phone to make a phone call. 

## 2019-01-17 NOTE — ED Notes (Signed)
BEHAVIORAL HEALTH ROUNDING Patient sleeping: Yes.   Patient alert and oriented: eyes closed  Appears asleep Behavior appropriate: Yes.  ; If no, describe:  Nutrition and fluids offered: Yes  Toileting and hygiene offered: sleeping Sitter present: q 15 minute observations and security camera monitoring Law enforcement present: yes  ODS 

## 2019-01-17 NOTE — ED Notes (Signed)
Pt was given the phone to make a phone call.

## 2019-01-18 MED ORDER — HALOPERIDOL 10 MG PO TABS: 10.0000 mg | ORAL_TABLET | Freq: Two times a day (BID) | ORAL | 0 refills | Status: DC

## 2019-01-18 MED ORDER — BENZTROPINE MESYLATE 1 MG PO TABS: 1.0000 mg | ORAL_TABLET | Freq: Two times a day (BID) | ORAL | Status: DC

## 2019-01-18 MED ORDER — BENZTROPINE MESYLATE 1 MG PO TABS: 1.0000 mg | ORAL_TABLET | Freq: Two times a day (BID) | ORAL | 0 refills | Status: DC

## 2019-01-18 MED ORDER — HALOPERIDOL 5 MG PO TABS: 10.0000 mg | ORAL_TABLET | Freq: Two times a day (BID) | ORAL | Status: DC

## 2019-01-18 NOTE — ED Notes (Signed)
Pt asking to be discharged repeatedly.  Pt informed the decision must be made by the NP. Maintained on 15 minute checks and observation by security camera for safety.

## 2019-01-18 NOTE — Discharge Instructions (Signed)
Bipolar 1 Disorder Bipolar 1 disorder is a mental health disorder in which a person has episodes of emotional highs (mania), and may also have episodes of emotional lows (depression) in addition to highs. Bipolar 1 disorder is different from other bipolar disorders because it involves extreme manic episodes. These episodes last at least one week or involve symptoms that are so severe that hospitalization is needed to keep the person safe. What increases the risk? The cause of this condition is not known. However, certain factors make you more likely to have bipolar disorder, such as:  Having a family member with the disorder.  An imbalance of certain chemicals in the brain (neurotransmitters).  Stress, such as illness, financial problems, or a death.  Certain conditions that affect the brain or spinal cord (neurologic conditions).  Brain injury (trauma).  Having another mental health disorder, such as: ? Obsessive compulsive disorder. ? Schizophrenia. What are the signs or symptoms? Symptoms of mania include:  Very high self-esteem or self-confidence.  Decreased need for sleep.  Unusual talkativeness or feeling a need to keep talking. Speech may be very fast. It may seem like you cannot stop talking.  Racing thoughts or constant talking, with quick shifts between topics that may or may not be related (flight of ideas).  Decreased ability to focus or concentrate.  Increased purposeful activity, such as work, studies, or social activity.  Increased nonproductive activity. This could be pacing, squirming and fidgeting, or finger and toe tapping.  Impulsive behavior and poor judgment. This may result in high-risk activities, such as having unprotected sex or spending a lot of money. Symptoms of depression include:  Feeling sad, hopeless, or helpless.  Frequent or uncontrollable crying.  Lack of feeling or caring about anything.  Sleeping too much.  Moving more slowly than  usual.  Not being able to enjoy things you used to enjoy.  Wanting to be alone all the time.  Feeling guilty or worthless.  Lack of energy or motivation.  Trouble concentrating or remembering.  Trouble making decisions.  Increased appetite.  Thoughts of death, or the desire to harm yourself. Sometimes, you may have a mixed mood. This means having symptoms of depression and mania. Stress can make symptoms worse. How is this diagnosed? To diagnose bipolar disorder, your health care provider may ask about your:  Emotional episodes.  Medical history.  Alcohol and drug use. This includes prescription medicines. Certain medical conditions and substances can cause symptoms that seem like bipolar disorder (secondary bipolar disorder). How is this treated? Bipolar disorder is a long-term (chronic) illness. It is best controlled with ongoing (continuous) treatment rather than treatment only when symptoms occur. Treatment may include:  Medicine. Medicine can be prescribed by a provider who specializes in treating mental disorders (psychiatrist). ? Medicines called mood stabilizers are usually prescribed. ? If symptoms occur even while taking a mood stabilizer, other medicines may be added.  Psychotherapy. Some forms of talk therapy, such as cognitive-behavioral therapy (CBT), can provide support, education, and guidance.  Coping methods, such as journaling or relaxation exercises. These may include: ? Yoga. ? Meditation. ? Deep breathing.  Lifestyle changes, such as: ? Limiting alcohol and drug use. ? Exercising regularly. ? Getting plenty of sleep. ? Making healthy eating choices.  A combination of medicine, talk therapy, and coping methods is best. A procedure in which electricity is applied to the brain through the scalp (electroconvulsive therapy) may be used in cases of severe mania when medicine and psychotherapy work too   slowly or do not work. Follow these instructions at  home: Activity   Return to your normal activities as told by your health care provider.  Find activities that you enjoy, and make time to do them.  Exercise regularly as told by your health care provider. Lifestyle  Limit alcohol intake to no more than 1 drink a day for nonpregnant women and 2 drinks a day for men. One drink equals 12 oz of beer, 5 oz of wine, or 1 oz of hard liquor.  Follow a set schedule for eating and sleeping.  Eat a balanced diet that includes fresh fruits and vegetables, whole grains, low-fat dairy, and lean meat.  Get 7-8 hours of sleep each night. General instructions  Take over-the-counter and prescription medicines only as told by your health care provider.  Think about joining a support group. Your health care provider may be able to recommend a support group.  Talk with your family and loved ones about your treatment goals and how they can help.  Keep all follow-up visits as told by your health care provider. This is important. Where to find more information For more information about bipolar disorder, visit the following websites:  National Alliance on Mental Illness: www.nami.org  U.[place] National Institute of Mental Health: www.nimh.nih.gov Contact a health care provider if:  Your symptoms get worse.  You have side effects from your medicine, and they get worse.  You have trouble sleeping.  You have trouble doing daily activities.  You feel unsafe in your surroundings.  You are dealing with substance abuse. Get help right away if:  You have new symptoms.  You have thoughts about harming yourself.  You self-harm. This information is not intended to replace advice given to you by your health care provider. Make sure you discuss any questions you have with your health care provider. Document Released: 10/02/2000 Document Revised: 06/08/2017 Document Reviewed: 02/24/2016 Elsevier Patient Education  2020 [employer]  

## 2019-01-18 NOTE — ED Notes (Signed)
Pt discharged home. Pt agitated and refused VS. All belongings returned to patient. D/C instructions and prescriptions reviewed with patient. Pt's mother to give pt a ride home. Pt denies SI/HI.
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
## 2019-01-18 NOTE — ED Notes (Signed)
Pt. Up requesting a shower, pt. Reminded showers would be done after breakfast.

## 2019-01-18 NOTE — ED Notes (Signed)
Pt insists  on going home because of his career.  Pt states he doesn't need to be here.  Pt making statements that he will leave today no matter what. "I don't want to get ugly, but I will if I have to."  Maintained on 15 minute checks and observation by security camera for safety.

## 2019-01-18 NOTE — BH Assessment (Signed)
TTS attempted to reach patient's mother, Todd Shaw [phone] to inform her of discharge plans. TTS was unable to leave a message because "the mailbox is full and cannot accept any messages at this time."

## 2019-01-18 NOTE — ED Provider Notes (Signed)
-----------------------------------------   5:39 AM on 01/18/2019 -----------------------------------------   Blood pressure 112/66, pulse 99, temperature 98.2 F (36.8 C), temperature source Oral, resp. rate 18, height 1.778 m (5\' 10" ), weight 77.1 kg, SpO2 100 %.  The patient is calm and cooperative at this time.  There have been no acute events since the last update.  Awaiting disposition plan from Behavioral Medicine and/or Social Work team(s).   Hinda Kehr, MD 01/18/19 [phone]

## 2019-06-27 ENCOUNTER — Emergency Department (HOSPITAL_COMMUNITY): Payer: Self-pay

## 2019-06-27 ENCOUNTER — Encounter (HOSPITAL_COMMUNITY): Payer: Self-pay | Admitting: Emergency Medicine

## 2019-06-27 ENCOUNTER — Emergency Department (HOSPITAL_COMMUNITY)
Admission: EM | Admit: 2019-06-27 | Discharge: 2019-06-27 | Disposition: A | Discharge status: Home / Self Care | Payer: Self-pay | Attending: Emergency Medicine | Admitting: Emergency Medicine

## 2019-06-27 DIAGNOSIS — S31030A Puncture wound without foreign body of lower back and pelvis without penetration into retroperitoneum, initial encounter: Secondary | ICD-10-CM

## 2019-06-27 DIAGNOSIS — Z20828 Contact with and (suspected) exposure to other viral communicable diseases: Secondary | ICD-10-CM | POA: Insufficient documentation

## 2019-06-27 DIAGNOSIS — S72012S Unspecified intracapsular fracture of left femur, sequela: Secondary | ICD-10-CM | POA: Insufficient documentation

## 2019-06-27 DIAGNOSIS — Y999 Unspecified external cause status: Secondary | ICD-10-CM | POA: Insufficient documentation

## 2019-06-27 DIAGNOSIS — S71141A Puncture wound with foreign body, right thigh, initial encounter: Secondary | ICD-10-CM | POA: Insufficient documentation

## 2019-06-27 DIAGNOSIS — S72102S Unspecified trochanteric fracture of left femur, sequela: Secondary | ICD-10-CM

## 2019-06-27 DIAGNOSIS — S61441A Puncture wound with foreign body of right hand, initial encounter: Secondary | ICD-10-CM | POA: Insufficient documentation

## 2019-06-27 DIAGNOSIS — W3400XA Accidental discharge from unspecified firearms or gun, initial encounter: Secondary | ICD-10-CM | POA: Insufficient documentation

## 2019-06-27 DIAGNOSIS — Y929 Unspecified place or not applicable: Secondary | ICD-10-CM | POA: Insufficient documentation

## 2019-06-27 DIAGNOSIS — Z23 Encounter for immunization: Secondary | ICD-10-CM | POA: Insufficient documentation

## 2019-06-27 DIAGNOSIS — Y939 Activity, unspecified: Secondary | ICD-10-CM | POA: Insufficient documentation

## 2019-06-27 LAB — URINALYSIS, ROUTINE W REFLEX MICROSCOPIC
Bilirubin Urine: NEGATIVE
Glucose, UA: NEGATIVE mg/dL
Hgb urine dipstick: NEGATIVE
Ketones, ur: NEGATIVE mg/dL
Leukocytes,Ua: NEGATIVE
Nitrite: NEGATIVE
Protein, ur: 30 mg/dL — AB
Specific Gravity, Urine: >1.046 — ABNORMAL HIGH (ref 1.005–1.030)
pH: 5 (ref 5.0–8.0)

## 2019-06-27 LAB — COMPREHENSIVE METABOLIC PANEL
ALT: 29 U/L (ref 0–44)
AST: 39 U/L (ref 15–41)
Albumin: 4.5 g/dL (ref 3.5–5.0)
Alkaline Phosphatase: 48 U/L (ref 38–126)
Anion gap: 17 — ABNORMAL HIGH (ref 5–15)
BUN: 9 mg/dL (ref 6–20)
CO2: 18 mmol/L — ABNORMAL LOW (ref 22–32)
Calcium: 9 mg/dL (ref 8.9–10.3)
Chloride: 101 mmol/L (ref 98–111)
Creatinine, Ser: 1 mg/dL (ref 0.61–1.24)
GFR calc Af Amer: >60 mL/min (ref >60)
GFR calc non Af Amer: >60 mL/min (ref >60)
Glucose, Bld: 191 mg/dL — ABNORMAL HIGH (ref 70–99)
Potassium: 3.3 mmol/L — ABNORMAL LOW (ref 3.5–5.1)
Sodium: 136 mmol/L (ref 135–145)
Total Bilirubin: 0.8 mg/dL (ref 0.3–1.2)
Total Protein: 7.3 g/dL (ref 6.5–8.1)

## 2019-06-27 LAB — ETHANOL: Alcohol, Ethyl (B): <10 mg/dL (ref <10)

## 2019-06-27 LAB — CBC
HCT: 43.9 % (ref 39.0–52.0)
Hemoglobin: 15.4 g/dL (ref 13.0–17.0)
MCH: 32 pg (ref 26.0–34.0)
MCHC: 35.1 g/dL (ref 30.0–36.0)
MCV: 91.3 fL (ref 80.0–100.0)
Platelets: 272 10*3/uL (ref 150–400)
RBC: 4.81 MIL/uL (ref 4.22–5.81)
RDW: 12.8 % (ref 11.5–15.5)
WBC: 7.3 10*3/uL (ref 4.0–10.5)
nRBC: 0 % (ref 0.0–0.2)

## 2019-06-27 LAB — I-STAT CHEM 8, ED
BUN: 11 mg/dL (ref 6–20)
Calcium, Ion: 1.05 mmol/L — ABNORMAL LOW (ref 1.15–1.40)
Chloride: 102 mmol/L (ref 98–111)
Creatinine, Ser: 0.8 mg/dL (ref 0.61–1.24)
Glucose, Bld: 188 mg/dL — ABNORMAL HIGH (ref 70–99)
HCT: 46 % (ref 39.0–52.0)
Hemoglobin: 15.6 g/dL (ref 13.0–17.0)
Potassium: 3.4 mmol/L — ABNORMAL LOW (ref 3.5–5.1)
Sodium: 135 mmol/L (ref 135–145)
TCO2: 22 mmol/L (ref 22–32)

## 2019-06-27 LAB — PROTIME-INR
INR: 1.1 (ref 0.8–1.2)
Prothrombin Time: 13.8 seconds (ref 11.4–15.2)

## 2019-06-27 LAB — SAMPLE TO BLOOD BANK

## 2019-06-27 LAB — SARS CORONAVIRUS 2 (TAT 6-24 HRS): SARS Coronavirus 2: NEGATIVE

## 2019-06-27 LAB — LACTIC ACID, PLASMA: Lactic Acid, Venous: 3.7 mmol/L (ref 0.5–1.9)

## 2019-06-27 LAB — POC SARS CORONAVIRUS 2 AG -  ED: SARS Coronavirus 2 Ag: NEGATIVE

## 2019-06-27 LAB — CDS SEROLOGY

## 2019-06-27 MED ORDER — HYDROMORPHONE HCL 1 MG/ML IJ SOLN
1.0000 mg | Freq: Once | INTRAMUSCULAR | Status: AC
  Administered 2019-06-27: 1 mg via INTRAVENOUS
  Filled 2019-06-27: qty 1

## 2019-06-27 MED ORDER — FENTANYL CITRATE (PF) 100 MCG/2ML IJ SOLN
INTRAMUSCULAR | Status: AC
  Filled 2019-06-27: qty 2

## 2019-06-27 MED ORDER — OXYCODONE-ACETAMINOPHEN 5-325 MG PO TABS: 1.0000 | ORAL_TABLET | Freq: Four times a day (QID) | ORAL | 0 refills | Status: DC | PRN

## 2019-06-27 MED ORDER — HYDROMORPHONE HCL 1 MG/ML IJ SOLN
1.0000 mg | Freq: Once | INTRAMUSCULAR | Status: AC
  Administered 2019-06-27: 1 mg via INTRAVENOUS

## 2019-06-27 MED ORDER — HYDROMORPHONE HCL 1 MG/ML IJ SOLN
INTRAMUSCULAR | Status: AC
  Filled 2019-06-27: qty 1

## 2019-06-27 MED ORDER — IOTHALAMATE MEGLUMINE 17.2 % UR SOLN
250.0000 mL | Freq: Once | URETHRAL | Status: AC | PRN
  Administered 2019-06-27: 16:00:00 20 mL via INTRAVESICAL

## 2019-06-27 MED ORDER — TETANUS-DIPHTH-ACELL PERTUSSIS 5-2.5-18.5 LF-MCG/0.5 IM SUSP
0.5000 mL | Freq: Once | INTRAMUSCULAR | Status: AC
  Administered 2019-06-27: 13:00:00 0.5 mL via INTRAMUSCULAR

## 2019-06-27 MED ORDER — CEPHALEXIN 500 MG PO CAPS: 500.0000 mg | ORAL_CAPSULE | Freq: Three times a day (TID) | ORAL | 0 refills | Status: DC

## 2019-06-27 MED ORDER — FENTANYL CITRATE (PF) 100 MCG/2ML IJ SOLN
INTRAMUSCULAR | Status: AC | PRN
  Administered 2019-06-27: 50 ug via INTRAVENOUS

## 2019-06-27 MED ORDER — HYDROMORPHONE HCL 1 MG/ML IJ SOLN
INTRAMUSCULAR | Status: AC | PRN
  Administered 2019-06-27: 1 mg via INTRAVENOUS

## 2019-06-27 MED ORDER — CEFAZOLIN SODIUM-DEXTROSE 2-4 GM/100ML-% IV SOLN
2.0000 g | Freq: Once | INTRAVENOUS | Status: AC
  Administered 2019-06-27: 12:00:00 2 g via INTRAVENOUS

## 2019-06-27 MED ORDER — IOHEXOL 350 MG/ML SOLN
100.0000 mL | Freq: Once | INTRAVENOUS | Status: AC | PRN
  Administered 2019-06-27: 100 mL via INTRAVENOUS
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
## 2019-06-27 NOTE — ED Notes (Signed)
Returned from CT.

## 2019-06-27 NOTE — ED Notes (Signed)
To CT
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
## 2019-06-27 NOTE — ED Notes (Signed)
Patient transported to X-ray 

## 2019-06-27 NOTE — Discharge Instructions (Signed)
Take motrin for pain   Take percocet for severe pain   Take keflex three times daily for 5 days to prevent infection   See urology and ortho doctors for follow up   Return to ER if you have severe pain, unable to urinate, urinating blood, fever.
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
## 2019-06-27 NOTE — ED Notes (Signed)
Speaking with [employer] officer at present- requesting phone--
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
## 2019-07-02 ENCOUNTER — Encounter: Payer: Self-pay | Admitting: Student

## 2019-07-02 DIAGNOSIS — S72122A Displaced fracture of lesser trochanter of left femur, initial encounter for closed fracture: Secondary | ICD-10-CM | POA: Insufficient documentation

## 2019-10-06 ENCOUNTER — Emergency Department (HOSPITAL_COMMUNITY)
Admission: EM | Admit: 2019-10-06 | Discharge: 2019-10-06 | Disposition: A | Discharge status: Home / Self Care | Payer: Self-pay | Attending: Emergency Medicine | Admitting: Emergency Medicine

## 2019-10-06 ENCOUNTER — Other Ambulatory Visit: Payer: Self-pay

## 2019-10-06 DIAGNOSIS — F29 Unspecified psychosis not due to a substance or known physiological condition: Secondary | ICD-10-CM | POA: Insufficient documentation

## 2019-10-06 DIAGNOSIS — Z5321 Procedure and treatment not carried out due to patient leaving prior to being seen by health care provider: Secondary | ICD-10-CM | POA: Insufficient documentation

## 2019-10-07 ENCOUNTER — Other Ambulatory Visit: Payer: Self-pay | Admitting: Behavioral Health

## 2019-10-07 ENCOUNTER — Inpatient Hospital Stay (HOSPITAL_COMMUNITY)
Admission: EM | Admit: 2019-10-07 | Discharge: 2019-10-09 | DRG: 885 | Disposition: A | Discharge status: Home / Self Care | Payer: Self-pay | Attending: Psychiatry | Admitting: Psychiatry

## 2019-10-07 ENCOUNTER — Encounter (HOSPITAL_COMMUNITY): Payer: Self-pay | Admitting: Psychiatry

## 2019-10-07 DIAGNOSIS — F312 Bipolar disorder, current episode manic severe with psychotic features: Principal | ICD-10-CM | POA: Diagnosis present

## 2019-10-07 DIAGNOSIS — F319 Bipolar disorder, unspecified: Secondary | ICD-10-CM | POA: Diagnosis present

## 2019-10-07 DIAGNOSIS — F17210 Nicotine dependence, cigarettes, uncomplicated: Secondary | ICD-10-CM | POA: Diagnosis present

## 2019-10-07 DIAGNOSIS — Z20822 Contact with and (suspected) exposure to covid-19: Secondary | ICD-10-CM | POA: Diagnosis present

## 2019-10-07 DIAGNOSIS — Z9114 Patient's other noncompliance with medication regimen: Secondary | ICD-10-CM

## 2019-10-07 DIAGNOSIS — G4700 Insomnia, unspecified: Secondary | ICD-10-CM | POA: Diagnosis present

## 2019-10-07 DIAGNOSIS — F1220 Cannabis dependence, uncomplicated: Secondary | ICD-10-CM | POA: Diagnosis present

## 2019-10-07 LAB — RESPIRATORY PANEL BY RT PCR (FLU A&B, COVID)
Influenza A by PCR: NEGATIVE
Influenza B by PCR: NEGATIVE
SARS Coronavirus 2 by RT PCR: NEGATIVE

## 2019-10-07 LAB — RAPID URINE DRUG SCREEN, HOSP PERFORMED
Amphetamines: NOT DETECTED
Barbiturates: NOT DETECTED
Benzodiazepines: POSITIVE — AB
Cocaine: NOT DETECTED
Opiates: NOT DETECTED
Tetrahydrocannabinol: POSITIVE — AB

## 2019-10-07 MED ORDER — CLONAZEPAM 1 MG PO TABS
1.0000 mg | ORAL_TABLET | Freq: Two times a day (BID) | ORAL | Status: AC
  Administered 2019-10-07 – 2019-10-09 (×4): 1 mg via ORAL
  Filled 2019-10-07 (×4): qty 1

## 2019-10-07 MED ORDER — HALOPERIDOL 5 MG PO TABS
10.0000 mg | ORAL_TABLET | Freq: Three times a day (TID) | ORAL | Status: DC
  Administered 2019-10-07 – 2019-10-08 (×2): 10 mg via ORAL
  Filled 2019-10-07 (×2): qty 2

## 2019-10-07 MED ORDER — CARVEDILOL 12.5 MG PO TABS
12.5000 mg | ORAL_TABLET | Freq: Two times a day (BID) | ORAL | Status: DC
  Administered 2019-10-07 – 2019-10-09 (×4): 12.5 mg via ORAL
  Filled 2019-10-07 (×8): qty 1

## 2019-10-07 MED ORDER — TEMAZEPAM 30 MG PO CAPS
30.0000 mg | ORAL_CAPSULE | Freq: Every day | ORAL | Status: DC
  Administered 2019-10-07 – 2019-10-08 (×2): 30 mg via ORAL
  Filled 2019-10-07 (×2): qty 1

## 2019-10-07 MED ORDER — LORAZEPAM 1 MG PO TABS: 1.0000 mg | ORAL_TABLET | Freq: Once | ORAL | Status: AC

## 2019-10-07 MED ORDER — LORAZEPAM 1 MG PO TABS: 1.0000 mg | ORAL_TABLET | ORAL | Status: DC | PRN

## 2019-10-07 MED ORDER — ZIPRASIDONE MESYLATE 20 MG IM SOLR
20.0000 mg | Freq: Two times a day (BID) | INTRAMUSCULAR | Status: DC | PRN
  Administered 2019-10-07: 11:00:00 20 mg via INTRAMUSCULAR

## 2019-10-07 MED ORDER — LORAZEPAM 2 MG/ML IJ SOLN
1.0000 mg | Freq: Once | INTRAMUSCULAR | Status: AC
  Administered 2019-10-07: 1 mg via INTRAMUSCULAR

## 2019-10-07 MED ORDER — BENZTROPINE MESYLATE 1 MG PO TABS
1.0000 mg | ORAL_TABLET | Freq: Two times a day (BID) | ORAL | Status: DC
  Administered 2019-10-07 – 2019-10-09 (×4): 1 mg via ORAL
  Filled 2019-10-07 (×8): qty 1

## 2019-10-07 NOTE — BH Assessment (Signed)
Assessment Note  Todd Shaw is an 34 y.o. male who presented to [facility] on IVC petitioned by his mother who indicated that patient has not been taking his medication and is becoming aggressive.  Patient admits that he has not taken his medication since he was discharged from [facility] four months ago stating that he does not know how to access services at [facility].  However, this has been an on-going pattern of behavior after each of his discharges from the hospital, he stops taking his medication.  He states that his mother says that he is acting hostile, yelling and screaming.  Patient states, "I will take my shot today so that I can go back home."  When being assessed by TTS, patient currently denies any thoughts about hurting himself or others and denies any current psychosis.  He is polite, cooperative and clean and neat. He denies any current drug usage.  Patient states that he has not been sleeping and states that he averages three hours per night.  He states that his appetite is good and states that he has not lost any weight.  He denies any history of abuse or self-mutilation.    However, after patient was seen by TTS, he had a meltdown and began yelling and he appeared to be responding to internal stimuli.  He was pacing around the room and unable to control his behavior.  Patient currently lives with his mother and is employed at [employer].  He states that he has never been married and he has no children.  Patient has a pending trespassing charge.  TTS contacted patient's mother, Shea Stakes [phone], for collateral information.  She states that patient was walking [employer] yesterday and could not remember the way home and she states that he did not recognize her yesterday.  She states that "this happens every four months, he won't take his medication and has a breakdown and has to come to the hospital, he needs longer term care."  Explained to patient's mother that longer term care is not  going to insure that he is going to take his medicine once he leaves the hospital based on his pattern of behavior and the fact he does not feel like he needs to be on medication.  Informed her that she needs to have a contingency with her son that in order to live in her house that he either takes his medication or he can stay at the shelter.  At this point she got angry and said that she was not going to do that and TTS informed her that she was enabling his behavior and there was not motivation for change.  She became irate with this Clinical research associate and could not see that her behavior is enabling his to continue and she got so upset that she hung up the phone.  Patient presents  As oriented and alert and is rational.  His behavior is currently under control and he does not appear to be a threat to himself or anyone else.  He is able to follow directions.  His judgment, insight and impulse control are partially impaired due to his mental illness, but he appears to be close to his baseline.  He does not appear to be responding to any internal stimuli.  His memory is intact and his thoughts are currently organized.   Diagnosis:  F31.12 Bipolar Disorder  Past Medical History:  Past Medical History:  Diagnosis Date  . Bipolar 1 disorder (HCC)   . Hallucinations   .  Psychosis [facility])     Past Surgical History:  Procedure Laterality Date  . APPLICATION OF A-CELL OF EXTREMITY Right 01/08/2017   Procedure: APPLICATION OF A-CELL RIGHT LOWER LEG;  Surgeon: Glenna Fellows, MD;  Location: [facility] OR;  Service: Plastics;  Laterality: Right;  . APPLICATION OF WOUND VAC Right 01/01/2017   Procedure: RIGHT LEG WOUND VAC CHANGE with irrigation and DEBRIDEMENT of right leg fasciotomy site;  Surgeon: Larina Earthly, MD;  Location: [facility] OR;  Service: Vascular;  Laterality: Right;  . APPLICATION OF WOUND VAC Right 01/03/2017   Procedure: WOUND VAC CHANGE;  Surgeon: Sherren Kerns, MD;  Location: [facility] OR;  Service: Vascular;   Laterality: Right;  . FASCIOTOMY Right 12/31/2016   Procedure: FASCIOTOMY, RIGHT LOWER EXTREMITY COMPARTMENT;  Surgeon: Maeola Harman, MD;  Location: [facility] OR;  Service: Vascular;  Laterality: Right;  . I & D EXTREMITY Right 01/03/2017   Procedure: RIGHT LOWER EXTREMITY FASCIOTOMY WASH-OUT. DEBRIDEMENT;  Surgeon: Sherren Kerns, MD;  Location: [facility] OR;  Service: Vascular;  Laterality: Right;  . SKIN SPLIT GRAFT Right 01/08/2017   Procedure: SKIN GRAFT SPLIT THICKNESS from right thigh to right leg;  Surgeon: Glenna Fellows, MD;  Location: [facility] OR;  Service: Plastics;  Laterality: Right;    Family History:  Family History  Problem Relation Age of Onset  . Mental illness Neg Hx     Social History:  reports that he has been smoking cigarettes. He has been smoking about 1.00 pack per day. He has never used smokeless tobacco. He reports current drug use. Drugs: Benzodiazepines and Marijuana. He reports that he does not drink alcohol.  Additional Social History:  Alcohol / Drug Use Pain Medications: See MAR Prescriptions: See MAR Over the Counter: See MAR History of alcohol / drug use?: No history of alcohol / drug abuse Longest period of sobriety (when/how long): States that he no longer uses drugs, drug use history unavailable  CIWA:   COWS:    Allergies:  Allergies  Allergen Reactions  . No Known Allergies     Home Medications: (Not in a hospital admission)   OB/GYN Status:  No LMP for male patient.  General Assessment Data Location of Assessment: [facility] Assessment Services TTS Assessment: In system Is this a Tele or Face-to-Face Assessment?: Face-to-Face Is this an Initial Assessment or a Re-assessment for this encounter?: Initial Assessment Patient Accompanied by:: Other(police) Language Other than English: No Living Arrangements: Other (Comment)(lives with mother) What gender do you identify as?: Male Marital status: Single Living Arrangements: Parent Can pt return  to current living arrangement?: Yes Admission Status: Involuntary Petitioner: Family member Is patient capable of signing voluntary admission?: Yes Referral Source: Self/Family/Friend Insurance type: Not on File  Medical Screening Exam [facility] Walk-in ONLY) Medical Exam completed: Yes  Crisis Care Plan Living Arrangements: Parent Name of Psychiatrist: none Name of Therapist: none  Education Status Is patient currently in school?: No Is the patient employed, unemployed or receiving disability?: Employed  Risk to self with the past 6 months Suicidal Ideation: No Has patient been a risk to self within the past 6 months prior to admission? : No Suicidal Intent: No Has patient had any suicidal intent within the past 6 months prior to admission? : No Is patient at risk for suicide?: No Suicidal Plan?: No Has patient had any suicidal plan within the past 6 months prior to admission? : No Access to Means: No What has been your use of drugs/alcohol within the last 12 months?:  none Previous Attempts/Gestures: No How many times?: 0 Other Self Harm Risks: off medications Triggers for Past Attempts: None known Intentional Self Injurious Behavior: None Family Suicide History: No Recent stressful life event(s): Other (Comment)(none reported) Persecutory voices/beliefs?: No Depression: Yes Depression Symptoms: Insomnia, Isolating Substance abuse history and/or treatment for substance abuse?: No Suicide prevention information given to non-admitted patients: Not applicable  Risk to Others within the past 6 months Homicidal Ideation: No Does patient have any lifetime risk of violence toward others beyond the six months prior to admission? : No Thoughts of Harm to Others: No Current Homicidal Intent: No Current Homicidal Plan: No Access to Homicidal Means: No Identified Victim: none History of harm to others?: No Assessment of Violence: None Noted Violent Behavior Description: none  reported Does patient have access to weapons?: No Criminal Charges Pending?: Yes Describe Pending Criminal Charges: Mathews Robinsons Is patient on probation?: No  Psychosis Hallucinations: None noted Delusions: None noted  Mental Status Report Appearance/Hygiene: Unremarkable Eye Contact: Good Motor Activity: Unremarkable Speech: Logical/coherent Level of Consciousness: Alert Mood: Ambivalent Affect: Flat Anxiety Level: Minimal Thought Processes: Coherent, Relevant Judgement: Partial Orientation: Person, Place, Time, Situation Obsessive Compulsive Thoughts/Behaviors: None  Cognitive Functioning Concentration: Normal Memory: Recent Intact, Remote Intact Is patient IDD: No Insight: Poor Impulse Control: Fair Appetite: Good Have you had any weight changes? : No Change Sleep: Decreased Total Hours of Sleep: 3 Vegetative Symptoms: None  ADLScreening [facility] Assessment Services) Patient's cognitive ability adequate to safely complete daily activities?: Yes Patient able to express need for assistance with ADLs?: Yes Independently performs ADLs?: Yes (appropriate for developmental age)  Prior Inpatient Therapy Prior Inpatient Therapy: Yes Prior Therapy Dates: last adm 4 mos ago Prior Therapy Facilty/Provider(s): [facility] Reason for Treatment: bipolar disorder  Prior Outpatient Therapy Prior Outpatient Therapy: No Does patient have an ACCT team?: No Does patient have Intensive In-House Services?  : No Does patient have [name] services? : No Does patient have P4CC services?: No  ADL Screening (condition at time of admission) Patient's cognitive ability adequate to safely complete daily activities?: Yes Is the patient deaf or have difficulty hearing?: No Does the patient have difficulty seeing, even when wearing glasses/contacts?: No Does the patient have difficulty concentrating, remembering, or making decisions?: No Patient able to express need for assistance with ADLs?:  Yes Does the patient have difficulty dressing or bathing?: No Independently performs ADLs?: Yes (appropriate for developmental age) Does the patient have difficulty walking or climbing stairs?: No Weakness of Legs: None Weakness of Arms/Hands: None  Home Assistive Devices/Equipment Home Assistive Devices/Equipment: None  Therapy Consults (therapy consults require a physician order) PT Evaluation Needed: No OT Evalulation Needed: No SLP Evaluation Needed: No Abuse/Neglect Assessment (Assessment to be complete while patient is alone) Abuse/Neglect Assessment Can Be Completed: Yes Physical Abuse: Denies Verbal Abuse: Denies Sexual Abuse: Denies Exploitation of patient/patient's resources: Denies Self-Neglect: Denies Values / Beliefs Cultural Requests During Hospitalization: None Spiritual Requests During Hospitalization: None Consults Spiritual Care Consult Needed: No Transition of Care Team Consult Needed: No Advance Directives (For Healthcare) Does Patient Have a Medical Advance Directive?: No Would patient like information on creating a medical advance directive?: No - Patient declined Nutrition Screen- [facility] Adult/[facility]/AP Has the patient recently lost weight without trying?: No Has the patient been eating poorly because of a decreased appetite?: No Malnutrition Screening Tool Score: 0        Disposition:  Per Mordecai Maes, NP, patient is recommended for inpatient treatment Disposition Initial Assessment Completed for this  Encounter: Yes Disposition of Patient: (Pending Provider Review)  On Site Evaluation by:   Reviewed with Physician:    Arnoldo Lenis Hayk Divis 10/07/2019 9:59 AM

## 2019-10-07 NOTE — H&P (Addendum)
Psychiatric Admission Assessment Adult  Patient Identification: Todd Shaw MRN:  [id number] Date of Evaluation:  10/07/2019 Chief Complaint:  Bipolar disorder with psychotic features [facility]) [F31.9] Principal Diagnosis: Bipolar manic with psychosis Diagnosis:  Active Problems:   Severe manic bipolar 1 disorder with psychotic behavior (HCC)   Bipolar disorder with psychotic features (HCC)  History of Present Illness:   This is a repeat admission for Todd Shaw, known to the service due to prior admissions, he suffers from a bipolar type condition complicated by poor compliance and an acute exacerbation prompted this current petition for involuntary commitment.  Has a history of cannabis abuse labs and drug screen are pending  The patient, on my exam simply argues for discharge, he is generally pleasant intrusive rambling and pressured but demanding to go stating he has a job to do.  Further denies auditory or visual hallucinations and insists he has been wrongfully hospitalized.  He presents on 3/30 9 AM, petition by his mother, due to noncompliance and aggressive behaviors.  He had been hospitalized at [facility] 4 months ago but again did not follow-up at [facility].  He tells me "does not need medications" patient has been treated however with haloperidol long-acting injectable.  Medication trials not only include Haldol but Seroquel, Wellbutrin, sertraline, aripiprazole   Associated Signs/Symptoms: Depression Symptoms:  insomnia, psychomotor agitation, (Hypo) Manic Symptoms:  Elevated Mood, Flight of Ideas, Anxiety Symptoms:  n/a Psychotic Symptoms:  Delusions, PTSD Symptoms: NA Total Time spent with patient: 45 minutes  Past Psychiatric History:   Patient was admitted 1 year ago with the following presentation Todd Shaw an 34 y.o.malewho presents involuntarilyaccompanied by law enforcementreporting primary symptoms ofmania, attempting to jump out of a window,  hitting his head against window, saying that Satan told him that he sold his birthright.Ptadmits that he has been feeling paranoid like people are after him, and states that he has "been on a bad trip" from using marijuana after tapering off his use for a few weeks. Ptacknowledges symptoms including irritability, decreased sleep.Pt deniescurrentSI, HI, psychosis. PT describes0past attempts, distant pasthistory of violence. Pt statesthat he feels "much better now after that medication, back to myself, and I am ready to leave, go home and start my new job at [employer] on Monday". Pt anxious to be discharged.  Collateral information: Todd Shaw Mother [phone]  Pt agrees for writer to speak with his mom, who states that pt has been off his medications for 6-7 months and that he usually goes to [facility]. She says that he has been "going on the downward spiral for about a week". She says that pt has a history of Bipolar, and she wants him to get IP treatment to stabilize. She states that the police had to block off the entire block for safety yesterday and that pt "tore up the uncle's house" as they tried to keep him from jumping out the window.   Is the patient at risk to self? Yes.    Has the patient been a risk to self in the past 6 months? Yes.    Has the patient been a risk to self within the distant past? Yes.    Is the patient a risk to others? Yes.    Has the patient been a risk to others in the past 6 months? No.  Has the patient been a risk to others within the distant past? Yes.     Prior Inpatient Therapy: Prior Inpatient Therapy: Yes Prior Therapy Dates: last  adm 4 mos ago Prior Therapy [facility] Reason for Treatment: bipolar disorder Prior Outpatient Therapy: Prior Outpatient Therapy: No Does patient have an ACCT team?: No Does patient have Intensive In-House Services?  : No Does patient have [name] services? : No Does patient have  P4CC services?: No  Alcohol Screening:   Substance Abuse History in the last 12 months:  Yes.   Consequences of Substance Abuse: NA Previous Psychotropic Medications: Yes  Psychological Evaluations: No  Past Medical History:  Past Medical History:  Diagnosis Date  . Bipolar 1 disorder (HCC)   . Hallucinations   . Psychosis [facility](HCC)     Past Surgical History:  Procedure Laterality Date  . APPLICATION OF A-CELL OF EXTREMITY Right 01/08/2017   Procedure: APPLICATION OF A-CELL RIGHT LOWER LEG;  Surgeon: Glenna Fellowshimmappa, Brinda, MD;  Location: [facility] OR;  Service: Plastics;  Laterality: Right;  . APPLICATION OF WOUND VAC Right 01/01/2017   Procedure: RIGHT LEG WOUND VAC CHANGE with irrigation and DEBRIDEMENT of right leg fasciotomy site;  Surgeon: Larina EarthlyEarly, Todd F, MD;  [facility];  Service: Vascular;  Laterality: Right;  . APPLICATION OF WOUND VAC Right 01/03/2017   Procedure: WOUND VAC CHANGE;  Surgeon: Sherren KernsFields, Charles E, MD;  [facility];  Service: Vascular;  Laterality: Right;  . FASCIOTOMY Right 12/31/2016   Procedure: FASCIOTOMY, RIGHT LOWER EXTREMITY COMPARTMENT;  Surgeon: Maeola Harmanain, Brandon Christopher, MD;  [facility];  Service: Vascular;  Laterality: Right;  . I & D EXTREMITY Right 01/03/2017   Procedure: RIGHT LOWER EXTREMITY FASCIOTOMY WASH-OUT. DEBRIDEMENT;  Surgeon: Sherren KernsFields, Charles E, MD;  [facility];  Service: Vascular;  Laterality: Right;  . SKIN SPLIT GRAFT Right 01/08/2017   Procedure: SKIN GRAFT SPLIT THICKNESS from right thigh to right leg;  Surgeon: Glenna Fellowshimmappa, Brinda, MD;  Location: [facility] OR;  Service: Plastics;  Laterality: Right;   Family History:  Family History  Problem Relation Age of Onset  . Mental illness Neg Hx    Family Psychiatric  History: ukn Tobacco Screening:   Social History:  Social History   Substance and Sexual Activity  Alcohol Use No   Comment: occas     Social History   Substance and Sexual Activity  Drug Use Yes  . Types: Benzodiazepines,  Marijuana    Additional Social History: Marital status: Single    Pain Medications: See MAR Prescriptions: See MAR Over the Counter: See MAR History of alcohol / drug use?: No history of alcohol / drug abuse Longest period of sobriety (when/how long): States that he no longer uses drugs, drug use history unavailable                    Allergies:  No Known Allergies Lab Results:  Results for orders placed or performed during the hospital encounter of 10/07/19 (from the past 48 hour(s))  Respiratory Panel by RT PCR (Flu A&B, Covid) - Nasopharyngeal Swab     Status: None   Collection Time: 10/07/19 10:32 AM   Specimen: Nasopharyngeal Swab  Result Value Ref Range   SARS Coronavirus 2 by RT PCR NEGATIVE NEGATIVE    Comment: (NOTE) SARS-CoV-2 target nucleic acids are NOT DETECTED. The SARS-CoV-2 RNA is generally detectable in upper respiratoy specimens during the acute phase of infection. The lowest concentration of SARS-CoV-2 viral copies this assay can detect is 131 copies/mL. A negative result does not preclude SARS-Cov-2 infection and should not be used as the sole basis for treatment or other patient management decisions. A  negative result may occur with  improper specimen collection/handling, submission of specimen other than nasopharyngeal swab, presence of viral mutation(s) within the areas targeted by this assay, and inadequate number of viral copies (<131 copies/mL). A negative result must be combined with clinical observations, patient history, and epidemiological information. The expected result is Negative. Fact Sheet for Patients:  PinkCheek.be Fact Sheet for Healthcare Providers:  GravelBags.it This test is not yet ap proved or cleared by the [place] FDA and  has been authorized for detection and/or diagnosis of SARS-CoV-2 by FDA under an Emergency Use Authorization (EUA). This EUA will remain  in  effect (meaning this test can be used) for the duration of the COVID-19 declaration under Section 564(b)(1) of the Act, 21 U.S.C. section 360bbb-3(b)(1), unless the authorization is terminated or revoked sooner.    Influenza A by PCR NEGATIVE NEGATIVE   Influenza B by PCR NEGATIVE NEGATIVE    Comment: (NOTE) The Xpert Xpress SARS-CoV-2/FLU/RSV assay is intended as an aid in  the diagnosis of influenza from Nasopharyngeal swab specimens and  should not be used as a sole basis for treatment. Nasal washings and  aspirates are unacceptable for Xpert Xpress SARS-CoV-2/FLU/RSV  testing. Fact Sheet for Patients: PinkCheek.be Fact Sheet for Healthcare Providers: GravelBags.it This test is not yet approved or cleared by the [place] FDA and  has been authorized for detection and/or diagnosis of SARS-CoV-2 by  FDA under an Emergency Use Authorization (EUA). This EUA will remain  in effect (meaning this test can be used) for the duration of the  Covid-19 declaration under Section 564(b)(1) of the Act, 21  U.S.C. section 360bbb-3(b)(1), unless the authorization is  terminated or revoked. Performed at [facility], [place] [address] [place], [place] [address]     Blood Alcohol level:  Lab Results  Component Value Date   [facility] <10 06/27/2019   ETH <10 15/17/6160    Metabolic Disorder Labs:  Lab Results  Component Value Date   HGBA1C 5.6 11/08/2015   MPG 114 11/08/2015   MPG 111 09/13/2015   Lab Results  Component Value Date   PROLACTIN 110.3 (H) 11/08/2015   PROLACTIN 30.8 (H) 09/13/2015   Lab Results  Component Value Date   CHOL 136 11/08/2015   TRIG 86 11/08/2015   HDL 36 (L) 11/08/2015   CHOLHDL 3.8 11/08/2015   VLDL 17 11/08/2015   LDLCALC 83 11/08/2015   LDLCALC 84 09/14/2015    Current Medications: Current Facility-Administered Medications  Medication Dose Route Frequency Provider Last  Rate Last Admin  . benztropine (COGENTIN) tablet 1 mg  1 mg Oral BID Johnn Hai, MD      . carvedilol (COREG) tablet 12.5 mg  12.5 mg Oral BID WC Johnn Hai, MD      . clonazePAM Bobbye Charleston) tablet 1 mg  1 mg Oral BID Johnn Hai, MD      . haloperidol (HALDOL) tablet 10 mg  10 mg Oral TID Johnn Hai, MD      . temazepam (RESTORIL) capsule 30 mg  30 mg Oral QHS Johnn Hai, MD      . ziprasidone (GEODON) injection 20 mg  20 mg Intramuscular Q12H PRN Mordecai Maes, NP   20 mg at 10/07/19 1045   PTA Medications: Medications Prior to Admission  Medication Sig Dispense Refill Last Dose  . benztropine (COGENTIN) 1 MG tablet Take 1 tablet (1 mg total) by mouth 2 (two) times daily. (Patient not taking: Reported on 10/07/2019) 60 tablet 0  Not Taking at Unknown time  . cephALEXin (KEFLEX) 500 MG capsule Take 1 capsule (500 mg total) by mouth 3 (three) times daily. (Patient not taking: Reported on 10/07/2019) 15 capsule 0 Completed Course at Unknown time  . haloperidol (HALDOL) 10 MG tablet Take 1 tablet (10 mg total) by mouth 2 (two) times daily. (Patient not taking: Reported on 10/07/2019) 60 tablet 0 Not Taking at Unknown time  . oxyCODONE-acetaminophen (PERCOCET) 5-325 MG tablet Take 1 tablet by mouth every 6 (six) hours as needed. (Patient not taking: Reported on 10/07/2019) 10 tablet 0 Completed Course at Unknown time  . traZODone (DESYREL) 50 MG tablet Take 1 tablet (50 mg total) by mouth at bedtime as needed for sleep. (Patient not taking: Reported on 10/07/2019) 30 tablet 1 Not Taking at Unknown time    Musculoskeletal: Strength & Muscle Tone: within normal limits Gait & Station: normal Patient leans: N/A  Psychiatric Specialty Exam: Physical Exam  Nursing note and vitals reviewed. Constitutional: He appears well-developed and well-nourished.  Cardiovascular: Normal rate and regular rhythm.    Review of Systems  Constitutional: Negative.   Eyes: Negative.   Respiratory: Negative.    Cardiovascular: Negative.   Gastrointestinal: Negative.   Endocrine: Negative.   Genitourinary: Negative.   Musculoskeletal: Negative.   Neurological: Negative.   Hematological: Negative.     Blood pressure (!) 140/101, pulse (!) 121, temperature 97.9 F (36.6 C), temperature source Oral, resp. rate 20, height 5\' 8"  (1.727 m), weight 84.6 kg, SpO2 97 %.Body mass index is 28.35 kg/m.  General Appearance: Casual  Eye Contact:  Fair  Speech:  Clear and Coherent  Volume:  Increased  Mood:  manic  Affect:  Congruent  Thought Process:  Irrelevant  Orientation:  Full (Time, Place, and Person)  Thought Content:  Illogical and Delusions  Suicidal Thoughts:  No  Homicidal Thoughts:  No  Memory:  Immediate;   Poor Recent;   Poor Remote;   Poor  Judgement:  Impaired  Insight:  Lacking  Psychomotor Activity:  Normal  Concentration:  Concentration: Poor and Attention Span: Poor  Recall:  Poor  Fund of Knowledge:  Poor  Language:  Good  Akathisia:  Negative  Handed:  Right  AIMS (if indicated):     Assets:  Leisure Time Physical Health  ADL's:  Intact  Cognition:  WNL  Sleep:       Treatment Plan Summary: Daily contact with patient to assess and evaluate symptoms and progress in treatment and Medication management  Observation Level/Precautions:  15 minute checks  Laboratory:  UDS  Psychotherapy: Reality based med and illness education  Medications: Resume haloperidol  Consultations:    Discharge Concerns: Longer-term compliance  Estimated LOS: 5-7  Other:     Physician Treatment Plan for Primary Diagnosis:   Axis I bipolar manic with psychosis Rule out continued cannabis abuse Chronic noncompliance Axis II deferred Axis III hypertensive  Plans Resume haloperidol  Long Term Goal(s): Improvement in symptoms so as ready for discharge  Short Term Goals: Ability to identify changes in lifestyle to reduce recurrence of condition will improve, Ability to verbalize  feelings will improve, Ability to disclose and discuss suicidal ideas and Ability to demonstrate self-control will improve  Physician Treatment Plan for Secondary Diagnosis: Active Problems:   Severe manic bipolar 1 disorder with psychotic behavior (HCC)   Bipolar disorder with psychotic features (HCC)  Long Term Goal(s): Improvement in symptoms so as ready for discharge  Short Term Goals: Ability to identify  changes in lifestyle to reduce recurrence of condition will improve, Ability to verbalize feelings will improve, Ability to disclose and discuss suicidal ideas, Ability to demonstrate self-control will improve and Ability to maintain clinical measurements within normal limits will improve  I certify that inpatient services furnished can reasonably be expected to improve the patient's condition.    Malvin Johns, MD 3/30/20213:16 PM

## 2019-10-07 NOTE — Tx Team (Signed)
Initial Treatment Plan 10/07/2019 1:46 PM Hulon Ferron NRW:[id number]    PATIENT STRESSORS: Health problems Medication change or noncompliance Substance abuse   PATIENT STRENGTHS: Ability for insight Average or above average intelligence Communication skills Supportive family/friends   PATIENT IDENTIFIED PROBLEMS: Hallucinations  Paranoia                   DISCHARGE CRITERIA:  Ability to meet basic life and health needs Adequate post-discharge living arrangements Motivation to continue treatment in a less acute level of care  PRELIMINARY DISCHARGE PLAN: Attend aftercare/continuing care group Outpatient therapy Return to previous living arrangement  PATIENT/FAMILY INVOLVEMENT: This treatment plan has been presented to and reviewed with the patient, Todd Shaw, and/or family member.  The patient and family have been given the opportunity to ask questions and make suggestions.  Clarene Critchley, RN 10/07/2019, 1:46 PM

## 2019-10-07 NOTE — [facility] Suicide Risk Assessment (Signed)
[facility] Admission Suicide Risk Assessment   Nursing information obtained from:  Patient, Family Demographic factors:  NA Current Mental Status:  NA Loss Factors:  NA Historical Factors:  NA Risk Reduction Factors:  Employed, Living with another person, especially a relative, Positive social support  Total Time spent with patient: 45 minutes Principal Problem: <principal problem not specified> Diagnosis:  Active Problems:   Severe manic bipolar 1 disorder with psychotic behavior (HCC)   Bipolar disorder with psychotic features (HCC)  Subjective Data: For mania/psychosis/noncompliance  Continued Clinical Symptoms:    The "Alcohol Use Disorders Identification Test", Guidelines for Use in Primary Care, Second Edition.  World Science writer [facility]). Score between 0-7:  no or low risk or alcohol related problems. Score between 8-15:  moderate risk of alcohol related problems. Score between 16-19:  high risk of alcohol related problems. Score 20 or above:  warrants further diagnostic evaluation for alcohol dependence and treatment.   CLINICAL FACTORS:  Acute mania lacking insight  Musculoskeletal: Strength & Muscle Tone: within normal limits Gait & Station: normal Patient leans: N/A  Psychiatric Specialty Exam: Physical Exam  Nursing note and vitals reviewed. Constitutional: He appears well-developed and well-nourished.  Cardiovascular: Normal rate and regular rhythm.    Review of Systems  Constitutional: Negative.   Eyes: Negative.   Respiratory: Negative.   Cardiovascular: Negative.   Gastrointestinal: Negative.   Endocrine: Negative.   Genitourinary: Negative.   Musculoskeletal: Negative.   Neurological: Negative.   Hematological: Negative.     Blood pressure (!) 140/101, pulse (!) 121, temperature 97.9 F (36.6 C), temperature source Oral, resp. rate 20, height 5\' 8"  (1.727 m), weight 84.6 kg, SpO2 97 %.Body mass index is 28.35 kg/m.  General Appearance: Casual   Eye Contact:  Fair  Speech:  Clear and Coherent  Volume:  Increased  Mood:  manic  Affect:  Congruent  Thought Process:  Irrelevant  Orientation:  Full (Time, Place, and Person)  Thought Content:  Illogical and Delusions  Suicidal Thoughts:  No  Homicidal Thoughts:  No  Memory:  Immediate;   Poor Recent;   Poor Remote;   Poor  Judgement:  Impaired  Insight:  Lacking  Psychomotor Activity:  Normal  Concentration:  Concentration: Poor and Attention Span: Poor  Recall:  Poor  Fund of Knowledge:  Poor  Language:  Good  Akathisia:  Negative  Handed:  Right  AIMS (if indicated):     Assets:  Leisure Time Physical Health  ADL's:  Intact  Cognition:  WNL  Sleep:       COGNITIVE FEATURES THAT CONTRIBUTE TO RISK:  Loss of executive function    SUICIDE RISK:   Minimal: No identifiable suicidal ideation.  Patients presenting with no risk factors but with morbid ruminations; may be classified as minimal risk based on the severity of the depressive symptoms  PLAN OF CARE: see eval  I certify that inpatient services furnished can reasonably be expected to improve the patient's condition.   , MD 10/07/2019, 3:24 PM

## 2019-10-07 NOTE — Progress Notes (Signed)
34 yo AA male admitted involuntarily to facility. Upon arrival, pt hypermanic, loud, intrusive (walking up on staff with fist balled), not easily redirected due to increased anxiety and agitation. Pt kept yelling for his mother. Staff made contact with pt's mother who calmed pt down momentarily. Pt kept yelling, "Jesus" repeatedly. Pt responding to internal stimuli, then giving blank stares. Unable to be redirected or calmed. Orders received for Geodon 20mg  IM and Ativan 1mg  IM. Pt took medication to Left Deltoid without difficulty. Allowed RN to test him for COVID without complications. Pt returned to his bed and  rested in until negative results for COVID19 obtained. RN gave report to 500 hall nurse. Pt ambulated off Observation unit independently with staff assist to 500 hall. Pt verbalized understanding of inpatient criteria. Pleasant demeanor upon leaving unit. Safety maintained.

## 2019-10-07 NOTE — Progress Notes (Signed)
[place] NOVEL CORONAVIRUS (COVID-19) DAILY CHECK-OFF SYMPTOMS - answer yes or no to each - every day NO YES  Have you had a fever in the past 24 hours?  . Fever (Temp > 37.80C / 100F) X   Have you had any of these symptoms in the past 24 hours? . New Cough .  Sore Throat  .  Shortness of Breath .  Difficulty Breathing .  Unexplained Body Aches   X   Have you had any one of these symptoms in the past 24 hours not related to allergies?   . Runny Nose .  Nasal Congestion .  Sneezing   X   If you have had runny nose, nasal congestion, sneezing in the past 24 hours, has it worsened?  X   EXPOSURES - check yes or no X   Have you traveled outside the state in the past 14 days?  X   Have you been in contact with someone with a confirmed diagnosis of COVID-19 or PUI in the past 14 days without wearing appropriate PPE?  X   Have you been living in the same home as a person with confirmed diagnosis of COVID-19 or a PUI (household contact)?    X   Have you been diagnosed with COVID-19?    X              What to do next: Answered NO to all: Answered YES to anything:   Proceed with unit schedule Follow the [facility] Inpatient Flowsheet.   

## 2019-10-07 NOTE — H&P (Signed)
Behavioral Health Medical Screening Exam  Todd Shaw is an 34 y.o. male.who presented to [facility] as a walk-in, under IVC, petitioned by his mother. Per chart review, patient psychiatric history inlcude  Severe manic bipolar 1 disorder with psychotic behavior. Per IVC," respondent is Bipolar and not taking his medication. Screams out," momma help me" when mother is standing beside him and he doesn't recognize her. Respondent tries to fight mother and sees things that are not there and will run from it.reposndent walks out into the street. Respondent was committed 4 months ago in [place].  Patient was initially calm upon admission. When writer went to evaluate patient, he was crying and yelling," momma" along with pacing back in forth. It was very difficult to elicit a response from patient.  He did state," I am having a mental breakdown" but could not provide any further information. He added," I was doing drugs but I have been off." Per chart review, he has a history of cannabis use. He again, could not provide any further information due to his current state. He admitted that he has not been compliant with his psychotropic medication. Per review of chart, patient was last on Haldol decanoate 100mg /ml. As per mother, patient has not had any psychotropic medications in 3-4 months and this is a common presentation when he is off of his medications. Patient has been IVC'd in the past after being off of his medications. Mother reports that patient has not been following up with any outpatient psychiatric services. He has recived past outpatient psychiatric services through [facility]. Patient last psychiatrical hospitalization was here at [facility] 08/2018 although per review of chart, he has had at least 3 priors. Per nurse prior to this evaluation, patient made references to Jesus and God.      Total Time spent with patient: 20 minutes  Psychiatric Specialty Exam: Physical Exam  Vitals  reviewed. Neurological: He is alert.    Review of Systems  There were no vitals taken for this visit.There is no height or weight on file to calculate BMI.  General Appearance: Casual  Eye Contact:  Minimal  Speech:  Clear and Coherent and Normal Rate  Volume:  Increased  Mood:  manic   Affect:  Congruent, Labile and Tearful  Thought Process:  Irrelevant  Orientation:  Full (Time, Place, and Person)  Thought Content:  Hallucinations: per mothers report  Suicidal Thoughts:  No  Homicidal Thoughts:  No  Memory:  Immediate;   Poor Recent;   Poor  Judgement:  Impaired  Insight:  Lacking  Psychomotor Activity:  Restlessness and pacing  Concentration: Concentration: Poor and Attention Span: Poor  Recall:  Poor  Fund of Knowledge:Fair  Language: Fair  Akathisia:  Negative  Handed:  Right  AIMS (if indicated):     Assets:  Social Support  Sleep:       Musculoskeletal: Strength & Muscle Tone: within normal limits Gait & Station: normal Patient leans: N/A  There were no vitals taken for this visit.  Recommendations: Recommending inpatient psychiatric treatment with goal of patient re-starting Haldol 100mg /ml which seems more appropriate  due to his documented history of noncompliance.   09/2018, NP 10/07/2019, 10:18 AM

## 2019-10-08 DIAGNOSIS — F319 Bipolar disorder, unspecified: Secondary | ICD-10-CM

## 2019-10-08 LAB — COMPREHENSIVE METABOLIC PANEL
ALT: 38 U/L (ref 0–44)
AST: 51 U/L — ABNORMAL HIGH (ref 15–41)
Albumin: 4.4 g/dL (ref 3.5–5.0)
Alkaline Phosphatase: 53 U/L (ref 38–126)
Anion gap: 7 (ref 5–15)
BUN: 7 mg/dL (ref 6–20)
CO2: 26 mmol/L (ref 22–32)
Calcium: 9.4 mg/dL (ref 8.9–10.3)
Chloride: 105 mmol/L (ref 98–111)
Creatinine, Ser: 0.92 mg/dL (ref 0.61–1.24)
GFR calc Af Amer: >60 mL/min (ref >60)
GFR calc non Af Amer: >60 mL/min (ref >60)
Glucose, Bld: 113 mg/dL — ABNORMAL HIGH (ref 70–99)
Potassium: 4.6 mmol/L (ref 3.5–5.1)
Sodium: 138 mmol/L (ref 135–145)
Total Bilirubin: 0.7 mg/dL (ref 0.3–1.2)
Total Protein: 7.3 g/dL (ref 6.5–8.1)

## 2019-10-08 LAB — CBC
HCT: 45.9 % (ref 39.0–52.0)
Hemoglobin: 15.1 g/dL (ref 13.0–17.0)
MCH: 31.5 pg (ref 26.0–34.0)
MCHC: 32.9 g/dL (ref 30.0–36.0)
MCV: 95.8 fL (ref 80.0–100.0)
Platelets: 348 10*3/uL (ref 150–400)
RBC: 4.79 MIL/uL (ref 4.22–5.81)
RDW: 13.6 % (ref 11.5–15.5)
WBC: 6.8 10*3/uL (ref 4.0–10.5)
nRBC: 0 % (ref 0.0–0.2)

## 2019-10-08 LAB — LIPID PANEL
Cholesterol: 144 mg/dL (ref 0–200)
HDL: 47 mg/dL (ref >40)
LDL Cholesterol: 92 mg/dL (ref 0–99)
Total CHOL/HDL Ratio: 3.1 RATIO
Triglycerides: 26 mg/dL (ref <150)
VLDL: 5 mg/dL (ref 0–40)

## 2019-10-08 LAB — HEMOGLOBIN A1C
Hgb A1c MFr Bld: 5.6 % (ref 4.8–5.6)
Mean Plasma Glucose: 114.02 mg/dL

## 2019-10-08 LAB — ETHANOL: Alcohol, Ethyl (B): <10 mg/dL (ref <10)

## 2019-10-08 LAB — TSH: TSH: 0.85 u[IU]/mL (ref 0.350–4.500)

## 2019-10-08 MED ORDER — LORAZEPAM 1 MG PO TABS: 4.0000 mg | ORAL_TABLET | Freq: Once | ORAL | Status: DC

## 2019-10-08 MED ORDER — HALOPERIDOL DECANOATE 100 MG/ML IM SOLN
150.0000 mg | INTRAMUSCULAR | Status: DC
  Administered 2019-10-08: 150 mg via INTRAMUSCULAR
  Filled 2019-10-08: qty 1.5
  Filled 2019-10-08: qty 2

## 2019-10-08 MED ORDER — LORAZEPAM 1 MG PO TABS
3.0000 mg | ORAL_TABLET | Freq: Once | ORAL | Status: AC
  Administered 2019-10-08: 3 mg via ORAL
  Filled 2019-10-08: qty 3

## 2019-10-08 MED ORDER — HALOPERIDOL 5 MG PO TABS
15.0000 mg | ORAL_TABLET | Freq: Three times a day (TID) | ORAL | Status: DC
  Administered 2019-10-08 – 2019-10-09 (×4): 15 mg via ORAL
  Filled 2019-10-08 (×9): qty 3

## 2019-10-08 NOTE — Progress Notes (Signed)
[facility] MD Progress Note  10/08/2019 8:02 AM Todd Shaw  MRN:  [id number] Subjective:   This is a repeat admission for Todd Shaw, 34 year old patient with a known bipolar type condition, complicated by noncompliance and cannabis dependency versus abuse.  Once again requiring petition for involuntary commitment  The patient remains intrusive, hypomanic, requesting discharge stating this is all "bullshit!" He also elaborates that he will not take medication at home, takes them here so he can leave, because his mother "told lies on him".  These statements are repeated to an unknown person on the phone after rounds.  Again lacking insight certainly manic Principal Problem: Psychosis cannabis dependence Diagnosis: Active Problems:   Severe manic bipolar 1 disorder with psychotic behavior (HCC)   Bipolar disorder with psychotic features (HCC)  Total Time spent with patient: 20 minutes  Past Psychiatric History: Prior similar presentations  Past Medical History:  Past Medical History:  Diagnosis Date  . Bipolar 1 disorder (HCC)   . Hallucinations   . Psychosis [facility])     Past Surgical History:  Procedure Laterality Date  . APPLICATION OF A-CELL OF EXTREMITY Right 01/08/2017   Procedure: APPLICATION OF A-CELL RIGHT LOWER LEG;  Surgeon: Glenna Fellows, MD;  Location: [facility] OR;  Service: Plastics;  Laterality: Right;  . APPLICATION OF WOUND VAC Right 01/01/2017   Procedure: RIGHT LEG WOUND VAC CHANGE with irrigation and DEBRIDEMENT of right leg fasciotomy site;  Surgeon: Larina Earthly, MD;  Location: [facility] OR;  Service: Vascular;  Laterality: Right;  . APPLICATION OF WOUND VAC Right 01/03/2017   Procedure: WOUND VAC CHANGE;  Surgeon: Sherren Kerns, MD;  Location: [facility] OR;  Service: Vascular;  Laterality: Right;  . FASCIOTOMY Right 12/31/2016   Procedure: FASCIOTOMY, RIGHT LOWER EXTREMITY COMPARTMENT;  Surgeon: Maeola Harman, MD;  Location: [facility] OR;  Service: Vascular;  Laterality: Right;   . I & D EXTREMITY Right 01/03/2017   Procedure: RIGHT LOWER EXTREMITY FASCIOTOMY WASH-OUT. DEBRIDEMENT;  Surgeon: Sherren Kerns, MD;  Location: [facility] OR;  Service: Vascular;  Laterality: Right;  . SKIN SPLIT GRAFT Right 01/08/2017   Procedure: SKIN GRAFT SPLIT THICKNESS from right thigh to right leg;  Surgeon: Glenna Fellows, MD;  Location: [facility] OR;  Service: Plastics;  Laterality: Right;   Family History:  Family History  Problem Relation Age of Onset  . Mental illness Neg Hx    Family Psychiatric  History: There is no data Social History:  Social History   Substance and Sexual Activity  Alcohol Use No   Comment: occas     Social History   Substance and Sexual Activity  Drug Use Yes  . Types: Benzodiazepines, Marijuana    Social History   Socioeconomic History  . Marital status: Single    Spouse name: Not on file  . Number of children: Not on file  . Years of education: Not on file  . Highest education level: Not on file  Occupational History  . Not on file  Tobacco Use  . Smoking status: Current Every Day Smoker    Packs/day: 1.00    Types: Cigarettes  . Smokeless tobacco: Never Used  Substance and Sexual Activity  . Alcohol use: No    Comment: occas  . Drug use: Yes    Types: Benzodiazepines, Marijuana  . Sexual activity: Yes  Other Topics Concern  . Not on file  Social History Narrative   ** Merged History Encounter **       Social Determinants of Health  Financial Resource Strain:   . Difficulty of Paying Living Expenses:   Food Insecurity:   . Worried About Programme researcher, broadcasting/film/video in the Last Year:   . Barista in the Last Year:   Transportation Needs:   . Freight forwarder (Medical):   Marland Kitchen Lack of Transportation (Non-Medical):   Physical Activity:   . Days of Exercise per Week:   . Minutes of Exercise per Session:   Stress:   . Feeling of Stress :   Social Connections:   . Frequency of Communication with Friends and Family:   .  Frequency of Social Gatherings with Friends and Family:   . Attends Religious Services:   . Active Member of Clubs or Organizations:   . Attends Banker Meetings:   Marland Kitchen Marital Status:    Additional Social History:    Pain Medications: See MAR Prescriptions: See MAR Over the Counter: See MAR History of alcohol / drug use?: No history of alcohol / drug abuse Longest period of sobriety (when/how long): States that he no longer uses drugs, drug use history unavailable                    Sleep: Fair  Appetite:  Fair  Current Medications: Current Facility-Administered Medications  Medication Dose Route Frequency Provider Last Rate Last Admin  . benztropine (COGENTIN) tablet 1 mg  1 mg Oral BID Malvin Johns, MD   1 mg at 10/08/19 0747  . carvedilol (COREG) tablet 12.5 mg  12.5 mg Oral BID WC Malvin Johns, MD   12.5 mg at 10/08/19 0748  . clonazePAM (KLONOPIN) tablet 1 mg  1 mg Oral BID Malvin Johns, MD   1 mg at 10/08/19 0747  . haloperidol (HALDOL) tablet 15 mg  15 mg Oral TID Malvin Johns, MD      . temazepam (RESTORIL) capsule 30 mg  30 mg Oral QHS Malvin Johns, MD   30 mg at 10/07/19 2030  . ziprasidone (GEODON) injection 20 mg  20 mg Intramuscular Q12H PRN Denzil Magnuson, NP   20 mg at 10/07/19 1045    Lab Results:  Results for orders placed or performed during the hospital encounter of 10/07/19 (from the past 48 hour(s))  Respiratory Panel by RT PCR (Flu A&B, Covid) - Nasopharyngeal Swab     Status: None   Collection Time: 10/07/19 10:32 AM   Specimen: Nasopharyngeal Swab  Result Value Ref Range   SARS Coronavirus 2 by RT PCR NEGATIVE NEGATIVE    Comment: (NOTE) SARS-CoV-2 target nucleic acids are NOT DETECTED. The SARS-CoV-2 RNA is generally detectable in upper respiratoy specimens during the acute phase of infection. The lowest concentration of SARS-CoV-2 viral copies this assay can detect is 131 copies/mL. A negative result does not preclude  SARS-Cov-2 infection and should not be used as the sole basis for treatment or other patient management decisions. A negative result may occur with  improper specimen collection/handling, submission of specimen other than nasopharyngeal swab, presence of viral mutation(s) within the areas targeted by this assay, and inadequate number of viral copies (<131 copies/mL). A negative result must be combined with clinical observations, patient history, and epidemiological information. The expected result is Negative. Fact Sheet for Patients:  https://www.moore.com/ Fact Sheet for Healthcare Providers:  https://www.young.biz/ This test is not yet ap proved or cleared by the [place] FDA and  has been authorized for detection and/or diagnosis of SARS-CoV-2 by FDA under an Emergency Use Authorization (EUA).  This EUA will remain  in effect (meaning this test can be used) for the duration of the COVID-19 declaration under Section 564(b)(1) of the Act, 21 U.S.C. section 360bbb-3(b)(1), unless the authorization is terminated or revoked sooner.    Influenza A by PCR NEGATIVE NEGATIVE   Influenza B by PCR NEGATIVE NEGATIVE    Comment: (NOTE) The Xpert Xpress SARS-CoV-2/FLU/RSV assay is intended as an aid in  the diagnosis of influenza from Nasopharyngeal swab specimens and  should not be used as a sole basis for treatment. Nasal washings and  aspirates are unacceptable for Xpert Xpress SARS-CoV-2/FLU/RSV  testing. Fact Sheet for Patients: PinkCheek.be Fact Sheet for Healthcare Providers: GravelBags.it This test is not yet approved or cleared by the [place] FDA and  has been authorized for detection and/or diagnosis of SARS-CoV-2 by  FDA under an Emergency Use Authorization (EUA). This EUA will remain  in effect (meaning this test can be used) for the duration of the  Covid-19 declaration  under Section 564(b)(1) of the Act, 21  U.S.C. section 360bbb-3(b)(1), unless the authorization is  terminated or revoked. Performed at [facility], [place] [address], [place], [place] [address]   Urine rapid drug screen (hosp performed)not at [facility]     Status: Abnormal   Collection Time: 10/07/19  3:15 PM  Result Value Ref Range   Opiates NONE DETECTED NONE DETECTED   Cocaine NONE DETECTED NONE DETECTED   Benzodiazepines POSITIVE (A) NONE DETECTED   Amphetamines NONE DETECTED NONE DETECTED   Tetrahydrocannabinol POSITIVE (A) NONE DETECTED   Barbiturates NONE DETECTED NONE DETECTED    Comment: (NOTE) DRUG SCREEN FOR MEDICAL PURPOSES ONLY.  IF CONFIRMATION IS NEEDED FOR ANY PURPOSE, NOTIFY LAB WITHIN 5 DAYS. LOWEST DETECTABLE LIMITS FOR URINE DRUG SCREEN Drug Class                     Cutoff (ng/mL) Amphetamine and metabolites    1000 Barbiturate and metabolites    200 Benzodiazepine                 185 Tricyclics and metabolites     300 Opiates and metabolites        300 Cocaine and metabolites        300 THC                            50 Performed at [facility], [place] [address] [place], [place] [address]   CBC     Status: None   Collection Time: 10/08/19  7:02 AM  Result Value Ref Range   WBC 6.8 4.0 - 10.5 K/uL   RBC 4.79 4.22 - 5.81 MIL/uL   Hemoglobin 15.1 13.0 - 17.0 g/dL   HCT 45.9 39.0 - 52.0 %   MCV 95.8 80.0 - 100.0 fL   MCH 31.5 26.0 - 34.0 pg   MCHC 32.9 30.0 - 36.0 g/dL   RDW 13.6 11.5 - 15.5 %   Platelets 348 150 - 400 K/uL   nRBC 0.0 0.0 - 0.2 %    Comment: Performed at [facility], [place] [address], [place], [place] [address]    Blood Alcohol level:  Lab Results  Component Value Date   [facility] <10 06/27/2019   ETH <10 78/58/8502    Metabolic Disorder Labs: Lab Results  Component Value Date   HGBA1C 5.6 11/08/2015   MPG 114 11/08/2015   MPG  111 09/13/2015   Lab Results  Component Value  Date   PROLACTIN 110.3 (H) 11/08/2015   PROLACTIN 30.8 (H) 09/13/2015   Lab Results  Component Value Date   CHOL 136 11/08/2015   TRIG 86 11/08/2015   HDL 36 (L) 11/08/2015   CHOLHDL 3.8 11/08/2015   VLDL 17 11/08/2015   LDLCALC 83 11/08/2015   LDLCALC 84 09/14/2015    Physical Findings: AIMS:  , ,  ,  ,    CIWA:  CIWA-Ar Total: 9 COWS:     Musculoskeletal: Strength & Muscle Tone: within normal limits Gait & Station: normal Patient leans: N/A  Psychiatric Specialty Exam: Physical Exam  Review of Systems  Blood pressure 135/89, pulse 95, temperature 98.6 F (37 C), temperature source Oral, resp. rate 18, height 5\' 8"  (1.727 m), weight 84.6 kg, SpO2 97 %.Body mass index is 28.35 kg/m.  General Appearance: Casual  Eye Contact:  Good  Speech:  Clear and Coherent  Volume:  Increased  Mood:  manic-hypomanic  Affect:  Congruent  Thought Process:  Irrelevant and Descriptions of Associations: Loose  Orientation:  Full (Time, Place, and Person)  Thought Content:  Illogical and Tangential  Suicidal Thoughts:  No  Homicidal Thoughts:  No  Memory:  Immediate;   Poor Recent;   Fair Remote;   Fair  Judgement:  Impaired  Insight:  Shallow  Psychomotor Activity:  Normal  Concentration:  Concentration: Fair and Attention Span: Fair  Recall:  of Knowledge:  Fair  Language:  Good  Akathisia:  Negative  Handed:  Right  AIMS (if indicated):     Assets:  Leisure Time Physical Health Resilience  ADL's:  Intact  Cognition:  WNL  Sleep:      Treatment Plan Summary: Daily contact with patient to assess and evaluate symptoms and progress in treatment and Medication management  Escalate Haldol to 15 mg 3 times daily as 10 mg 3 times daily seen today as well as curving medially causing some degree of sedation. Long-acting injectable Monitor for safety  [employer], MD 10/08/2019, 8:02 AM

## 2019-10-08 NOTE — Progress Notes (Signed)
   10/07/19 2100  Psych Admission Type (Psych Patients Only)  Admission Status Involuntary  Psychosocial Assessment  Patient Complaints Other (Comment) (sleeping too much)  Eye Contact Fair  Facial Expression Flat  Affect Flat;Blunted  Speech Soft;Slow  Interaction Cautious;Guarded  Motor Activity Slow  Appearance/Hygiene Disheveled  Behavior Characteristics Guarded;Calm;Cooperative  Mood Apprehensive  Thought Process  Coherency Disorganized  Content Blaming others;Preoccupation  Delusions None reported or observed  Perception WDL  Hallucination None reported or observed  Judgment Limited  Confusion Mild  Danger to Self  Current suicidal ideation? Denies  Danger to Others  Danger to Others None reported or observed   D: Patient in room only coming out for medications & snacks. Reports just wanting to sleep. Denies SI/HI/AVH. A: Medications administered as prescribed. Needs met as able. Support and encouragement provided.  R: Patient remains safe on the unit. Will continue to monitor for safety and stability.

## 2019-10-08 NOTE — BHH Group Notes (Signed)
LCSW Aftercare Discharge Planning Group Note  10/08/2019   Type of Group and Topic: Psychoeducational Group: Discharge Planning  Participation Level: None  Description of Group  Discharge planning group reviews patient's anticipated discharge plans and assists patients to anticipate and address any barriers to wellness/recovery in the community. Suicide prevention education is reviewed with patients in group.  Therapeutic Goals  1. Patients will state their anticipated discharge plan and mental health aftercare  2. Patients will identify potential barriers to wellness in the community setting  3. Patients will engage in problem solving, solution focused discussion of ways to anticipate and address barriers to wellness/recovery  Summary of Patient Progress Patient walked into group and left.  Plan for Discharge/Comments:  Transportation Means:  Supports:  Therapeutic Modalities:  Motivational Interviewing  Darreld Mclean, [place]  10/08/2019 1:54 PM

## 2019-10-08 NOTE — Progress Notes (Signed)
Recreation Therapy Notes  INPATIENT RECREATION THERAPY ASSESSMENT  Patient Details Name: Todd Shaw MRN: [id number] DOB: [birth year] Today's Date: 10/08/2019       Information Obtained From: Patient(Chart Review as well)  Able to Participate in Assessment/Interview: Yes  Patient Presentation: Alert(Agitated)  Reason for Admission (Per Patient): Med Non-Compliance(Pt did not answer; Per chart: manic behavior)  Patient Stressors: (Pt refused the answer.)  Coping Skills:   (Pt refused to answer.)  Leisure Interests (2+):  Music - Listen, Individual - Other (Comment)(Work)  Frequency of Recreation/Participation: Other (Comment)(Daily)  Awareness of Community Resources:  Yes  Community Resources:  Research scientist (physical sciences)  Current Use: Yes  If no, Barriers?:    Expressed Interest in [employer] Information: No  County of Residence:  [place]  Patient Main Form of Transportation: Other (Comment)(Friends also taxi)  Patient Strengths:  Paying attention; Being on point; Exercising  Patient Identified Areas of Improvement:  None identified  Patient Goal for Hospitalization:  "get out"  Current SI (including self-harm):  No  Current HI:  No  Current AVH: No  Staff Intervention Plan: Group Attendance, Collaborate with Interdisciplinary Treatment Team  Consent to Intern Participation: N/A   Caroll Rancher, LRT/CTRS  Caroll Rancher A 10/08/2019, 12:02 PM

## 2019-10-08 NOTE — BHH Suicide Risk Assessment (Signed)
BHH INPATIENT:  Family/Significant Other Suicide Prevention Education  Suicide Prevention Education:  Patient Refusal for Family/Significant Other Suicide Prevention Education: The patient Todd Shaw has refused to provide written consent for family/significant other to be provided Family/Significant Other Suicide Prevention Education during admission and/or prior to discharge.  Physician notified.  Darreld Mclean 10/08/2019, 3:24 PM

## 2019-10-08 NOTE — BHH Counselor (Signed)
Adult Comprehensive Assessment  Patient ID: Todd Shaw, male   DOB: 1985/08/15, 34 y.o.   MRN: [id number]  Information Source: Information source: Patient  Current Stressors: Patient states their primary concerns and needs for treatment are:: "I'm fine. Nothing is wrong with me." Patient states their goals for this hospitilization and ongoing recovery are:: "Discharge, get back to work. 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 He is worried about losing his current employment contract due to being hospitalized. Financial / Lack of resources (include bankruptcy): Has income from employment, no insurance Housing / Lack of housing:Unstable housing- has been homeless. Family: Reports his mother lied to have him committed.  Social: Denies stressors   Living/Environment/Situation:  Living Arrangements:alone Living conditions (as described by patient or guardian):"It's good, I'm about to get a better place." How long has patient lived in current situation?:Unkown. What is atmosphere in current home:unsure  Family History:  Marital status: Single Are you sexually active?:Yes What is your sexual orientation?: Heterosexual  Has your sexual activity been affected by drugs, alcohol, medication, or emotional stress?:No Does patient have children?: No  Childhood History:  By whom was/is the patient raised?: Mother Additional childhood history information: "Pretty good. I had a pretty good decent life honestly."Mom and dad split when pt was young, some contact with dad after that. Description of patient's relationship with caregiver when they were a child: "Pretty good. Close relationship." Patient's description of current relationship with people who raised him/her: Pt reports having little contact with his bio father. Strained relationship with mother at this time, but they have been close.  How were you disciplined when  you got in trouble as a child/adolescent?: "I used to get whoopings. No time-out or nothing like that." Does patient have siblings?: Yes Number of Siblings:6(2 brothers and 4sisters ) Description of patient's current relationship with siblings: 'It's alright. Closer to some siblings than others." Did patient suffer any verbal/emotional/physical/sexual abuse as a child?: No Did patient suffer from severe childhood neglect?: No (No childhood neglect but pt reports feeling like the outcast of his familly.) Has patient ever been sexually abused/assaulted/raped as an adolescent or adult?: No Was the patient ever a victim of a crime or a disaster?: No Witnessed domestic violence?: No Has patient been effected by domestic violence as an adult?: No No changes to trauma history 08/26/18.  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 "It's a pattern for me." Where was the patient employed at that time?: NA Has patient ever been in the [employer]?: No Has patient ever served in combat?: No Did You Receive Any Psychiatric Treatment/Services While in Equities trader?: (NA) Are There Guns or Other Weapons in Your Home?: Noguns reported. Are These Weapons Safely Secured?: (NA)  Financial Resources:  Financial resources: Income fromtemp jobs, support from mother. Does patient have a representative payee or guardian?:  No  Alcohol/Substance Abuse:  What has been your use  of drugs/alcohol within the last 12 months?:alcohol: 1x per month, marijuana: daily use, 1 blunt since age 61. If attempted suicide, did drugs/alcohol play a role in this?: No Alcohol/Substance Abuse Treatment Hx: Denies past history Has alcohol/substance abuse ever caused legal problems?: No  Social Support System:  Patient's Community Support System: Good Describe Community Support System: Mother, aunt Type of faith/religion: Darrick Meigs  How does patient's faith help to cope with current illness?: "Pretty well"  Leisure/Recreation:  Leisure and Hobbies: Play music, play pianio, keyboard, organ, reading   Strengths/Needs: What is the patient's perception of their strengths?: playing music Patient states they can use these personal strengths during their treatment to contribute to their recovery: "stay motivated and positive" Patient states these barriers may affect/interfere with their treatment: none Patient states these barriers may affect their return to the community: none Other important information patient would like considered in planning for their treatment: none  Discharge Plan: Currently receiving community mental health services: No Patient states concerns and preferences for aftercare planning are: Has been to [facility] in past and willing to return, he is agreeable to virtual appointments.  Patient states they will know when they are safe and ready for discharge when: "I'm safe now" Does patient have access to transportation?: No Does patient have financial barriers related to discharge medications?: Yes Patient description of barriers related to discharge medications: no insurance Plan for no access to transportation at discharge: [employer], Diplomatic Services operational officer Will patient be returning to same living situation after discharge?: Yes  Summary/Recommendations:   Summary and Recommendations  (to be completed by the evaluator): Todd Shaw is a 34 year old male from [place].  He presented to [facility] as a walk-in, under IVC, petitioned by his mother. Per chart review, patient psychiatric history inlcude  Severe manic bipolar 1 disorder with psychotic behavior. Per IVC, "respondent is Bipolar and not taking his medication." Recommendations for pt include crisis stabilization, therapeutic milieu, attend and participate in groups, medication management, and development of comprehensive mental wellness plan.  Joellen Jersey. 10/08/2019

## 2019-10-08 NOTE — Progress Notes (Signed)
Recreation Therapy Notes  Date: 3.31.21 Time: 1000 Location: 500 Hall Dayroom  Group Topic: Communication, Team Building, Problem Solving  Goal Area(s) Addresses:  Patient will effectively work with peer towards shared goal.  Patient will identify skill used to make activity successful.  Patient will identify how skills used during activity can be used to reach post d/c goals.   Intervention: STEM Activity   Activity: In team's, using 10 red plastic cups and a rubber band with strings attached, patients were asked to flip the cups over and stack them into a pyramid.    Education: Pharmacist, community, Building control surveyor.   Education Outcome: Acknowledges education/In group clarification offered/Needs additional education.   Clinical Observations/Feedback: Pt did not attend group.     Caroll Rancher, LRT/CTRS         Caroll Rancher A 10/08/2019 11:53 AM

## 2019-10-08 NOTE — Tx Team (Signed)
Interdisciplinary Treatment and Diagnostic Plan Update  10/08/2019 Time of Session: 9:00am Todd Shaw MRN: [id number]  Principal Diagnosis: <principal problem not specified>  Secondary Diagnoses: Active Problems:   Severe manic bipolar 1 disorder with psychotic behavior ([place])   Bipolar disorder with psychotic features ([place])   Current Medications:  Current Facility-Administered Medications  Medication Dose Route Frequency Provider Last Rate Last Admin  . benztropine (COGENTIN) tablet 1 mg  1 mg Oral BID Johnn Hai, MD   1 mg at 10/08/19 0747  . carvedilol (COREG) tablet 12.5 mg  12.5 mg Oral BID WC Johnn Hai, MD   12.5 mg at 10/08/19 0748  . clonazePAM (KLONOPIN) tablet 1 mg  1 mg Oral BID Johnn Hai, MD   1 mg at 10/08/19 0747  . haloperidol (HALDOL) tablet 15 mg  15 mg Oral TID Johnn Hai, MD      . haloperidol decanoate (HALDOL DECANOATE) 100 MG/ML injection 150 mg  150 mg Intramuscular Q30 days Johnn Hai, MD      . temazepam (RESTORIL) capsule 30 mg  30 mg Oral QHS Johnn Hai, MD   30 mg at 10/07/19 2030  . ziprasidone (GEODON) injection 20 mg  20 mg Intramuscular Q12H PRN Mordecai Maes, NP   20 mg at 10/07/19 1045   PTA Medications: Medications Prior to Admission  Medication Sig Dispense Refill Last Dose  . benztropine (COGENTIN) 1 MG tablet Take 1 tablet (1 mg total) by mouth 2 (two) times daily. (Patient not taking: Reported on 10/07/2019) 60 tablet 0 Not Taking at Unknown time  . cephALEXin (KEFLEX) 500 MG capsule Take 1 capsule (500 mg total) by mouth 3 (three) times daily. (Patient not taking: Reported on 10/07/2019) 15 capsule 0 Completed Course at Unknown time  . haloperidol (HALDOL) 10 MG tablet Take 1 tablet (10 mg total) by mouth 2 (two) times daily. (Patient not taking: Reported on 10/07/2019) 60 tablet 0 Not Taking at Unknown time  . oxyCODONE-acetaminophen (PERCOCET) 5-325 MG tablet Take 1 tablet by mouth every 6 (six) hours as needed. (Patient not  taking: Reported on 10/07/2019) 10 tablet 0 Completed Course at Unknown time  . traZODone (DESYREL) 50 MG tablet Take 1 tablet (50 mg total) by mouth at bedtime as needed for sleep. (Patient not taking: Reported on 10/07/2019) 30 tablet 1 Not Taking at Unknown time    Patient Stressors: Health problems Medication change or noncompliance Substance abuse  Patient Strengths: Ability for insight Average or above average intelligence Communication skills Supportive family/friends  Treatment Modalities: Medication Management, Group therapy, Case management,  1 to 1 session with clinician, Psychoeducation, Recreational therapy.   Physician Treatment Plan for Primary Diagnosis: <principal problem not specified> Long Term Goal(s): Improvement in symptoms so as ready for discharge Improvement in symptoms so as ready for discharge   Short Term Goals: Ability to identify changes in lifestyle to reduce recurrence of condition will improve Ability to verbalize feelings will improve Ability to disclose and discuss suicidal ideas Ability to demonstrate self-control will improve Ability to identify changes in lifestyle to reduce recurrence of condition will improve Ability to verbalize feelings will improve Ability to disclose and discuss suicidal ideas Ability to demonstrate self-control will improve Ability to maintain clinical measurements within normal limits will improve  Medication Management: Evaluate patient's response, side effects, and tolerance of medication regimen.  Therapeutic Interventions: 1 to 1 sessions, Unit Group sessions and Medication administration.  Evaluation of Outcomes: Not Met  Physician Treatment Plan for Secondary Diagnosis: Active  Problems:   Severe manic bipolar 1 disorder with psychotic behavior ([place])   Bipolar disorder with psychotic features ([place])  Long Term Goal(s): Improvement in symptoms so as ready for discharge Improvement in symptoms so as ready for  discharge   Short Term Goals: Ability to identify changes in lifestyle to reduce recurrence of condition will improve Ability to verbalize feelings will improve Ability to disclose and discuss suicidal ideas Ability to demonstrate self-control will improve Ability to identify changes in lifestyle to reduce recurrence of condition will improve Ability to verbalize feelings will improve Ability to disclose and discuss suicidal ideas Ability to demonstrate self-control will improve Ability to maintain clinical measurements within normal limits will improve     Medication Management: Evaluate patient's response, side effects, and tolerance of medication regimen.  Therapeutic Interventions: 1 to 1 sessions, Unit Group sessions and Medication administration.  Evaluation of Outcomes: Not Met   RN Treatment Plan for Primary Diagnosis: <principal problem not specified> Long Term Goal(s): Knowledge of disease and therapeutic regimen to maintain health will improve  Short Term Goals: Ability to verbalize frustration and anger appropriately will improve, Ability to identify and develop effective coping behaviors will improve and Compliance with prescribed medications will improve  Medication Management: RN will administer medications as ordered by provider, will assess and evaluate patient's response and provide education to patient for prescribed medication. RN will report any adverse and/or side effects to prescribing provider.  Therapeutic Interventions: 1 on 1 counseling sessions, Psychoeducation, Medication administration, Evaluate responses to treatment, Monitor vital signs and CBGs as ordered, Perform/monitor CIWA, COWS, AIMS and Fall Risk screenings as ordered, Perform wound care treatments as ordered.  Evaluation of Outcomes: Not Met   LCSW Treatment Plan for Primary Diagnosis: <principal problem not specified> Long Term Goal(s): Safe transition to appropriate next level of care at  discharge, Engage patient in therapeutic group addressing interpersonal concerns.  Short Term Goals: Engage patient in aftercare planning with referrals and resources, Increase social support, Increase ability to appropriately verbalize feelings, Facilitate patient progression through stages of change regarding substance use diagnoses and concerns, Identify triggers associated with mental health/substance abuse issues and Increase skills for wellness and recovery  Therapeutic Interventions: Assess for all discharge needs, 1 to 1 time with Social worker, Explore available resources and support systems, Assess for adequacy in community support network, Educate family and significant other(s) on suicide prevention, Complete Psychosocial Assessment, Interpersonal group therapy.  Evaluation of Outcomes: Not Met  Progress in Treatment: Attending groups: No. New to unit.  Participating in groups: No. Taking medication as prescribed: Yes. Toleration medication: Yes. Family/Significant other contact made: No, will contact:  supports if consents are granted. Patient understands diagnosis: No. Discussing patient identified problems/goals with staff: Yes. Medical problems stabilized or resolved: Yes. Denies suicidal/homicidal ideation: Yes. Issues/concerns per patient self-inventory: No.  New problem(s) identified: No, Describe:  CSW continuing to assess  New Short Term/Long Term Goal(s): medication management for mood stabilization; elimination of SI thoughts; development of comprehensive mental wellness/sobriety plan.  Patient Goals:  "I'm fine. Nothing is wrong with me."  Discharge Plan or Barriers: Patient recently admitted to unit, CSW assessing for appropriate referrals.   Reason for Continuation of Hospitalization: Anxiety Delusions  Medication stabilization  Estimated Length of Stay: 3-5 days  Attendees: Patient: Todd Shaw 10/08/2019 9:45 AM  Physician: Dr.Farah 10/08/2019 9:45 AM   Nursing:  10/08/2019 9:45 AM  RN Care Manager: 10/08/2019 9:45 AM  Social Worker: Stephanie Acre, LCSWA 10/08/2019 9:45 AM  Recreational Therapist:  10/08/2019 9:45 AM  Other:  10/08/2019 9:45 AM  Other:  10/08/2019 9:45 AM  Other: 10/08/2019 9:45 AM    Scribe for Treatment Team: Joellen Jersey, LCSWA 10/08/2019 9:45 AM

## 2019-10-09 LAB — PROLACTIN: Prolactin: 50.6 ng/mL — ABNORMAL HIGH (ref 4.0–15.2)

## 2019-10-09 MED ORDER — HALOPERIDOL DECANOATE 100 MG/ML IM SOLN: 150.0000 mg | INTRAMUSCULAR | 11 refills | Status: DC

## 2019-10-09 MED ORDER — BENZTROPINE MESYLATE 1 MG PO TABS: 1.0000 mg | ORAL_TABLET | Freq: Two times a day (BID) | ORAL | 2 refills | Status: DC

## 2019-10-09 MED ORDER — HALOPERIDOL 5 MG PO TABS
10.0000 mg | ORAL_TABLET | Freq: Every day | ORAL | Status: DC
  Filled 2019-10-09: qty 14

## 2019-10-09 MED ORDER — HALOPERIDOL 10 MG PO TABS: ORAL_TABLET | ORAL | 2 refills | Status: DC

## 2019-10-09 MED ORDER — HALOPERIDOL 5 MG PO TABS: 10.0000 mg | ORAL_TABLET | Freq: Every day | ORAL | Status: DC

## 2019-10-09 MED ORDER — CARVEDILOL 12.5 MG PO TABS: 12.5000 mg | ORAL_TABLET | Freq: Two times a day (BID) | ORAL | 2 refills | Status: DC

## 2019-10-09 MED ORDER — HALOPERIDOL 5 MG PO TABS
20.0000 mg | ORAL_TABLET | Freq: Every day | ORAL | Status: DC
  Filled 2019-10-09: qty 8

## 2019-10-09 NOTE — B[facility] Counselor (Signed)
Patient has a [employer] date this morning, patient is aware and granted CSW permission to fax a letter and call. CSW faxed a letter to [phone] with patient's admission date to [facility].  CSW spoke with Ms.Sessoms [phone] to confirm receipt of documentation.  Enid Cutter, MSW, LCSW-A Clinical Social Worker [facility] Adult Unit

## 2019-10-09 NOTE — Progress Notes (Signed)
Recreation Therapy Notes  INPATIENT RECREATION TR PLAN  Patient Details Name: Todd Shaw MRN: [id number] DOB: 17-Jan-1986 Today's Date: 10/09/2019  Rec Therapy Plan Is patient appropriate for Therapeutic Recreation?: Yes Treatment times per week: about 3 days Estimated Length of Stay: 5-7 days TR Treatment/Interventions: Group participation (Comment)  Discharge Criteria Pt will be discharged from therapy if:: Discharged Treatment plan/goals/alternatives discussed and agreed upon by:: Patient/family  Discharge Summary Short term goals set: See patient care plan Short term goals met: Not met Progress toward goals comments: Groups attended Which groups?: Self-esteem Reason goals not met: Pt did not participate in group session. Therapeutic equipment acquired: N/A Reason patient discharged from therapy: Discharge from hospital Pt/family agrees with progress & goals achieved: Yes Date patient discharged from therapy: 10/09/19    Victorino Sparrow, LRT/CTRS  Ria Comment, Jessaca Philippi A 10/09/2019, 11:37 AM

## 2019-10-09 NOTE — Progress Notes (Signed)
Pt discharged to lobby. Pt was stable and appreciative at that time. All papers, samples and prescriptions were given and valuables returned. Verbal understanding expressed. Denies SI/HI and A/VH. Pt given opportunity to express concerns and ask questions.  

## 2019-10-09 NOTE — [facility] Suicide Risk Assessment (Signed)
[facility] Discharge Suicide Risk Assessment   Principal Problem: Bipolar manic with psychosis Discharge Diagnoses: Active Problems:   Severe manic bipolar 1 disorder with psychotic behavior ([facility])   Bipolar disorder with psychotic features ([facility])   Total Time spent with patient: 45 minutes  Musculoskeletal: Strength & Muscle Tone: within normal limits Gait & Station: normal Patient leans: N/A  Psychiatric Specialty Exam: Review of Systems  Blood pressure 127/75, pulse 80, temperature 98.1 F (36.7 C), temperature source Oral, resp. rate 18, height 5\' 8"  (1.727 m), weight 84.6 kg, SpO2 97 %.Body mass index is 28.35 kg/m.  General Appearance: Casual  Eye Contact::  Good  Speech:  Pressured409  Volume:  Normal  Mood:  Hypomanic versus personality  Affect:  Congruent  Thought Process:  Goal Directed  Orientation:  Full (Time, Place, and Person)  Thought Content:  Tangential  Suicidal Thoughts:  No  Homicidal Thoughts:  No  Memory:  Immediate;   Fair Recent;   Fair Remote;   Fair  Judgement:  Other:  Patient has been repeatedly overheard stating he will not take medications at home, necessitating moderately high doses of haloperidol injection  Insight:  Shallow  Psychomotor Activity:  Normal  Concentration:  Fair  Recall:  [phone] of Knowledge:Fair  Language: Good  Akathisia:  Negative  Handed:  Right  AIMS (if indicated):     Assets:  Communication Skills Desire for Improvement Leisure Time Physical Health Resilience Social Support  Sleep:  Number of Hours: 3.75  Cognition: WNL  ADL's:  Intact   Mental Status Per Nursing Assessment::   On Admission:  NA  Demographic Factors:  Male  Loss Factors: NA  Historical Factors: Impulsivity  Risk Reduction Factors:   Sense of responsibility to family, Religious beliefs about death and Employed  Continued Clinical Symptoms:  Previous Psychiatric Diagnoses and Treatments  Cognitive Features That Contribute To Risk:   Loss of executive function    Suicide Risk:  Minimal: No identifiable suicidal ideation.  Patients presenting with no risk factors but with morbid ruminations; may be classified as minimal risk based on the severity of the depressive symptoms  Follow-up Information    [name] Follow up on 10/14/2019.   Why: You are scheduled for an appointment on 10/14/19 at 10:30 am.  This will be a virtual tele-health appointment. Contact information: [address] [place] [phone]           Plan Of Care/Follow-up recommendations:  Activity:  full  Glorianna Gott, MD 10/09/2019, 11:12 AM

## 2019-10-09 NOTE — Discharge Summary (Signed)
Physician Discharge Summary Note  Patient:  Todd Shaw is an 34 y.o., male  MRN:  [id number]  DOB:  12-Dec-1985  Patient phone:  [phone] (home)   Patient address:   [address] [place] [address],   Total Time spent with patient: Greater than 30 minutes  Date of Admission:  10/07/2019  Date of Discharge: 10-09-19  Reason for Admission: Worsening symptoms of Bipolar disorder.  Principal Problem: Bipolar disorder with psychotic features [facility])  Discharge Diagnoses: Principal Problem:   Bipolar disorder with psychotic features (HCC) Active Problems:   Severe manic bipolar 1 disorder with psychotic behavior (HCC)  Past Psychiatric History: Bipolar disorder with psychotic behavior  Past Medical History:  Past Medical History:  Diagnosis Date  . Bipolar 1 disorder (HCC)   . Hallucinations   . Psychosis [facility])     Past Surgical History:  Procedure Laterality Date  . APPLICATION OF A-CELL OF EXTREMITY Right 01/08/2017   Procedure: APPLICATION OF A-CELL RIGHT LOWER LEG;  Surgeon: Glenna Fellows, MD;  Location: [facility] OR;  Service: Plastics;  Laterality: Right;  . APPLICATION OF WOUND VAC Right 01/01/2017   Procedure: RIGHT LEG WOUND VAC CHANGE with irrigation and DEBRIDEMENT of right leg fasciotomy site;  Surgeon: Larina Earthly, MD;  Location: [facility] OR;  Service: Vascular;  Laterality: Right;  . APPLICATION OF WOUND VAC Right 01/03/2017   Procedure: WOUND VAC CHANGE;  Surgeon: Sherren Kerns, MD;  Location: [facility] OR;  Service: Vascular;  Laterality: Right;  . FASCIOTOMY Right 12/31/2016   Procedure: FASCIOTOMY, RIGHT LOWER EXTREMITY COMPARTMENT;  Surgeon: Maeola Harman, MD;  Location: [facility] OR;  Service: Vascular;  Laterality: Right;  . I & D EXTREMITY Right 01/03/2017   Procedure: RIGHT LOWER EXTREMITY FASCIOTOMY WASH-OUT. DEBRIDEMENT;  Surgeon: Sherren Kerns, MD;  Location: [facility] OR;  Service: Vascular;  Laterality: Right;  . SKIN SPLIT GRAFT Right  01/08/2017   Procedure: SKIN GRAFT SPLIT THICKNESS from right thigh to right leg;  Surgeon: Glenna Fellows, MD;  Location: [facility] OR;  Service: Plastics;  Laterality: Right;   Family History:  Family History  Problem Relation Age of Onset  . Mental illness Neg Hx    Family Psychiatric  History: see H&P  Social History:  Social History   Substance and Sexual Activity  Alcohol Use No   Comment: occas     Social History   Substance and Sexual Activity  Drug Use Yes  . Types: Benzodiazepines, Marijuana    Social History   Socioeconomic History  . Marital status: Single    Spouse name: Not on file  . Number of children: Not on file  . Years of education: Not on file  . Highest education level: Not on file  Occupational History  . Not on file  Tobacco Use  . Smoking status: Current Every Day Smoker    Packs/day: 1.00    Types: Cigarettes  . Smokeless tobacco: Never Used  Substance and Sexual Activity  . Alcohol use: No    Comment: occas  . Drug use: Yes    Types: Benzodiazepines, Marijuana  . Sexual activity: Yes  Other Topics Concern  . Not on file  Social History Narrative   ** Merged History Encounter **       Social Determinants of Health   Financial Resource Strain:   . Difficulty of Paying Living Expenses:   Food Insecurity:   . Worried About Programme researcher, broadcasting/film/video in the Last Year:   .  Ran Out of Food in the Last Year:   Transportation Needs:   . Freight forwarder (Medical):   Marland Kitchen Lack of Transportation (Non-Medical):   Physical Activity:   . Days of Exercise per Week:   . Minutes of Exercise per Session:   Stress:   . Feeling of Stress :   Social Connections:   . Frequency of Communication with Friends and Family:   . Frequency of Social Gatherings with Friends and Family:   . Attends Religious Services:   . Active Member of Clubs or Organizations:   . Attends Banker Meetings:   Marland Kitchen Marital Status:    Hospital Course: (Per Md's  admission evaluation notes): This is a repeat admission for Todd Shaw, known to the service due to prior admissions, he suffers from a bipolar type condition complicated by poor compliance and an acute exacerbation prompted this current petition for involuntary commitment.  Has a history of cannabis abuse labs and drug screen are pending. The patient, on my exam simply argues for discharge, he is generally pleasant intrusive rambling and pressured but demanding to go stating he has a job to do.  Further denies auditory or visual hallucinations and insists he has been wrongfully hospitalized. He presents on 3/30 9 AM, petition by his mother, due to noncompliance and aggressive behaviors.  He had been hospitalized at [facility] 4 months ago but again did not follow-up at [facility].  He tells me "does not need medications" patient has been treated however with haloperidol long-acting injectable. Medication trials not only include Haldol but Seroquel, Wellbutrin, sertraline, aripiprazole.  This is one of several psychiatric discharge summaries from this [facility] for this 34 year old male. He is with hx of chronic mental illness,  substance use disorders, multiple psychiatric admissions & non-compliance to his treatment regimen. He has been tried on multiple psychotropic medications for his symptoms & it appeared he has not been compliant with his recommended treatment regimen. He was brought to the hospital this time around for evaluation under IVC for worsening symptoms of his mental illness, only this time, the current hospitalization was a brief one.  After evaluation of his presenting symptoms, Todd Shaw was recommended for mood stabilization treatments. The medication regimen for his presenting symptoms were discussed & with his consent initiated. However, he did not want to stay in the hospital this time. He demanded to be discharged as he has a job to do at home. He also denied any hallucinations & expressed that  his IVC was wrong. He did however receive an antipsychotic injectable. He also received other medications as listed below on his discharge medication lists. He was enrolled & participated in the group counseling sessions being offered & held on this unit. He learned coping skills. He presented on this admission, other chronic medical conditions that required treatment & monitoring. He was resumed, treated & discharged on all his pertinent home medications for those pre-existing health issues. He tolerated his treatment regimen without any adverse effects or reactions reported.  During the course of this his brief hospitalization, the 15-minute checks were adequate to ensure Ralf's safety.  Patient did not display any dangerous, violent or suicidal behavior on the unit. He interacted with patients & staff appropriately, participated appropriately in the group sessions/therapies. His medications were addressed & adjusted to meet his needs. He was recommended for outpatient follow-up care & medication management upon discharge to assure his continuity of care.  At the time of discharge, patient is  not reporting any acute suicidal/homicidal ideations. He feels more confident about his self-care & in managing his symptoms. He currently denies any new issues or concerns. Education and supportive counseling provided throughout her hospital stay & upon discharge.  Today upon his discharge evaluation with the attending psychiatrist, Ryott shares he is doing well. He denies any other specific concerns. He is sleeping well. His appetite is good. He denies other physical complaints. He denies AH/VH. He was able to engage in safety planning including plan to return to [place] or contact emergency services if he feels unable to maintain his own safety or the safety of others. Pt had no further questions, comments, or concerns. He left [facility] with all personal belongings in no apparent distress. Transportation per the city bus. [facility]  assisted with bus pass.  Physical Findings: AIMS:  , ,  ,  ,    CIWA:  CIWA-Ar Total: 9 COWS:     Musculoskeletal: Strength & Muscle Tone: within normal limits Gait & Station: normal Patient leans: N/A  Psychiatric Specialty Exam: Physical Exam  Nursing note and vitals reviewed. Constitutional: He is oriented to person, place, and time. He appears well-developed.  Eyes: Pupils are equal, round, and reactive to light.  Cardiovascular: Normal rate.  Respiratory: Effort normal.  Genitourinary:    Genitourinary Comments: Deferred   Musculoskeletal:        General: Normal range of motion.     Cervical back: Normal range of motion.  Neurological: He is alert and oriented to person, place, and time.  Skin: Skin is warm and dry.    Review of Systems  Constitutional: Negative for chills, diaphoresis and fever.  HENT: Negative for congestion, rhinorrhea, sneezing and sore throat.   Eyes: Negative for discharge.  Respiratory: Negative for cough, chest tightness, shortness of breath and wheezing.   Cardiovascular: Negative for chest pain and palpitations.  Gastrointestinal: Negative for diarrhea, nausea and vomiting.  Endocrine: Negative.   Genitourinary: Negative for difficulty urinating.  Musculoskeletal: Negative.   Skin: Negative.   Allergic/Immunologic: Negative for environmental allergies and food allergies.       NKDA  Neurological: Negative for dizziness, tremors, syncope, light-headedness, numbness and headaches.  Psychiatric/Behavioral: Positive for dysphoric mood (Stabilized with medication prior to discharge), hallucinations (Hx of (Stabilized with medication upon discharge)) and sleep disturbance (Stabilized with medication prior to discharge). Negative for agitation, behavioral problems, confusion, decreased concentration, self-injury and suicidal ideas. The patient is not nervous/anxious (Stable) and is not hyperactive.     Blood pressure 127/75, pulse 80, temperature  98.1 F (36.7 C), temperature source Oral, resp. rate 18, height 5\' 8"  (1.727 m), weight 84.6 kg, SpO2 97 %.Body mass index is 28.35 kg/m.  See Md's discharge SRA  Sleep:  Number of Hours: 3.75   Has this patient used any form of tobacco in the last 30 days? (Cigarettes, Smokeless Tobacco, Cigars, and/or Pipes): N/A  Blood Alcohol level:  Lab Results  Component Value Date   ETH <10 10/08/2019   ETH <10 06/27/2019   Metabolic Disorder Labs:  Lab Results  Component Value Date   HGBA1C 5.6 10/08/2019   MPG 114.02 10/08/2019   MPG 114 11/08/2015   Lab Results  Component Value Date   PROLACTIN 50.6 (H) 10/08/2019   PROLACTIN 110.3 (H) 11/08/2015   Lab Results  Component Value Date   CHOL 144 10/08/2019   TRIG 26 10/08/2019   HDL 47 10/08/2019   CHOLHDL 3.1 10/08/2019   VLDL 5 10/08/2019  [place] 92 10/08/2019   LDLCALC 83 11/08/2015   See Psychiatric Specialty Exam and Suicide Risk Assessment completed by Attending Physician prior to discharge.  Discharge destination:  Home  Is patient on multiple antipsychotic therapies at discharge:  No   Has Patient had three or more failed trials of antipsychotic monotherapy by history:  No  Recommended Plan for Multiple Antipsychotic Therapies: NA  Allergies as of 10/09/2019   No Known Allergies     Medication List    STOP taking these medications   cephALEXin 500 MG capsule Commonly known as: KEFLEX   oxyCODONE-acetaminophen 5-325 MG tablet Commonly known as: Percocet     TAKE these medications     Indication  benztropine 1 MG tablet Commonly known as: COGENTIN Take 1 tablet (1 mg total) by mouth 2 (two) times daily.  Indication: Extrapyramidal Reaction caused by Medications   carvedilol 12.5 MG tablet Commonly known as: COREG Take 1 tablet (12.5 mg total) by mouth 2 (two) times daily with a meal.  Indication: High Blood Pressure Disorder   haloperidol 10 MG tablet Commonly known as: HALDOL 1 in am 2 at hs x 2  days  Then 1 at hs only What changed:   how much to take  how to take this  when to take this  additional instructions  Indication: Manic Phase of Manic-Depression   haloperidol decanoate 100 MG/ML injection Commonly known as: HALDOL DECANOATE Inject 1.5 mLs (150 mg total) into the muscle every 30 (thirty) days. Due 4/26 Start taking on: November 07, 2019  Indication: Manic Phase of Manic-Depression   traZODone 50 MG tablet Commonly known as: DESYREL Take 1 tablet (50 mg total) by mouth at bedtime as needed for sleep.  Indication: Trouble Sleeping      Follow-up Information    [name] Follow up on 10/14/2019.   Why: You are scheduled for an appointment on 10/14/19 at 10:30 am.  This will be a virtual tele-health appointment. Contact information: [address] [place] [address] [phone]          Follow-up recommendations:  Activity:  As tolerated Diet: As recommended by your primary care doctor. Keep all scheduled follow-up appointments as recommended.   Comments: Prescriptions given at discharge.  Patient agreeable to plan.  Given opportunity to ask questions.  Appears to feel comfortable with discharge denies any current suicidal or homicidal thought. Patient is also instructed prior to discharge to: Take all medications as prescribed by his/her mental healthcare provider. Report any adverse effects and or reactions from the medicines to his/her outpatient provider promptly. Patient has been instructed & cautioned: To not engage in alcohol and or illegal drug use while on prescription medicines. In the event of worsening symptoms, patient is instructed to call the crisis hotline, 911 and or go to the nearest ED for appropriate evaluation and treatment of symptoms. To follow-up with his/her primary care provider for your other medical issues, concerns and or health care needs.  Signed: Lindell Spar, NP 10/09/2019, 2:06 PM

## 2019-10-09 NOTE — Progress Notes (Signed)
  [facility] Adult Case Management Discharge Plan :  Will you be returning to the same living situation after discharge:  Yes,  home. At discharge, do you have transportation home?: Yes,  taking the bus. Do you have the ability to pay for your medications: No. Provided samples.  Release of information consent forms completed and in the chart.  Patient to Follow up at: Follow-up Information    [name] Follow up on 10/14/2019.   Why: You are scheduled for an appointment on 10/14/19 at 10:30 am.  This will be a virtual tele-health appointment. Contact information: [address] [place] [address] [phone]           Next level of care provider has access to [facility] Link:no  Safety Planning and Suicide Prevention discussed: Yes,  with patient. Patient declined consents.  Has patient been referred to the Quitline?: N/A patient is not a smoker  Patient has been referred for addiction treatment: Yes  Darreld Mclean, LCSWA 10/09/2019, 11:38 AM

## 2019-10-09 NOTE — Progress Notes (Signed)
   10/08/19 2226  Psych Admission Type (Psych Patients Only)  Admission Status Involuntary  Psychosocial Assessment  Patient Complaints Anxiety  Eye Contact Fair  Facial Expression Animated  Affect Appropriate to circumstance  Speech Logical/coherent  Interaction Assertive  Motor Activity Other (Comment) (WNL)  Appearance/Hygiene Unremarkable  Behavior Characteristics Cooperative;Calm  Mood Pleasant  Thought Process  Coherency WDL  Content Blaming others  Delusions None reported or observed  Perception WDL  Hallucination None reported or observed  Judgment Limited  Confusion None  Danger to Self  Current suicidal ideation? Denies  Danger to Others  Danger to Others None reported or observed

## 2019-10-09 NOTE — Progress Notes (Signed)
Recreation Therapy Notes  Date: 4.1.21 Time: 1000 Location: 500 Hall Dayroom  Group Topic: Self-Esteem  Goal Area(s) Addresses:  Patient will successfully identify positive attributes about themselves.  Patient will successfully identify benefit of improved self-esteem.   Behavioral Response: None  Intervention: Blank license plate template, construction paper, glue sticks, scissors, music  Activity: Personalized Plates.  Patients were to create personalized license plates that highlight unique things about them such as accomplishments, important dates, special talents, etc.  Education:  Self-Esteem, Building control surveyor.   Education Outcome: Acknowledges education/In group clarification offered/Needs additional education  Clinical Observations/Feedback: Pt came in and sat down a few times but did not stay or participate.    Caroll Rancher, LRT/CTRS    Caroll Rancher A 10/09/2019 11:26 AM

## 2019-10-09 NOTE — Plan of Care (Signed)
Pt came to one group session but did not participate in recreation therapy group session.   Caroll Rancher, LRT/CTRS

## 2020-04-28 ENCOUNTER — Ambulatory Visit (HOSPITAL_COMMUNITY)
Admission: EM | Admit: 2020-04-28 | Discharge: 2020-04-29 | Disposition: A | Discharge status: Home / Self Care | Payer: No Payment, Other | Attending: Psychiatry | Admitting: Psychiatry

## 2020-04-28 ENCOUNTER — Encounter (HOSPITAL_COMMUNITY): Payer: Self-pay | Admitting: Physician Assistant

## 2020-04-28 ENCOUNTER — Other Ambulatory Visit: Payer: Self-pay

## 2020-04-28 ENCOUNTER — Encounter (HOSPITAL_COMMUNITY): Payer: Self-pay

## 2020-04-28 ENCOUNTER — Ambulatory Visit (HOSPITAL_COMMUNITY): Payer: No Payment, Other | Admitting: Physician Assistant

## 2020-04-28 VITALS — BP 148/90 | HR 95 | Ht 68.0 in | Wt 148.0 lb

## 2020-04-28 DIAGNOSIS — Z01818 Encounter for other preprocedural examination: Secondary | ICD-10-CM | POA: Insufficient documentation

## 2020-04-28 DIAGNOSIS — Z20822 Contact with and (suspected) exposure to covid-19: Secondary | ICD-10-CM | POA: Insufficient documentation

## 2020-04-28 DIAGNOSIS — F312 Bipolar disorder, current episode manic severe with psychotic features: Secondary | ICD-10-CM

## 2020-04-28 LAB — CBC WITH DIFFERENTIAL/PLATELET
Abs Immature Granulocytes: 0.03 10*3/uL (ref 0.00–0.07)
Basophils Absolute: 0 10*3/uL (ref 0.0–0.1)
Basophils Relative: 0 %
Eosinophils Absolute: 0.1 10*3/uL (ref 0.0–0.5)
Eosinophils Relative: 1 %
HCT: 46.9 % (ref 39.0–52.0)
Hemoglobin: 15.7 g/dL (ref 13.0–17.0)
Immature Granulocytes: 0 %
Lymphocytes Relative: 17 %
Lymphs Abs: 1.5 10*3/uL (ref 0.7–4.0)
MCH: 30.6 pg (ref 26.0–34.0)
MCHC: 33.5 g/dL (ref 30.0–36.0)
MCV: 91.4 fL (ref 80.0–100.0)
Monocytes Absolute: 0.8 10*3/uL (ref 0.1–1.0)
Monocytes Relative: 9 %
Neutro Abs: 6.4 10*3/uL (ref 1.7–7.7)
Neutrophils Relative %: 73 %
Platelets: 255 10*3/uL (ref 150–400)
RBC: 5.13 MIL/uL (ref 4.22–5.81)
RDW: 13 % (ref 11.5–15.5)
WBC: 8.7 10*3/uL (ref 4.0–10.5)
nRBC: 0 % (ref 0.0–0.2)

## 2020-04-28 LAB — POCT URINE DRUG SCREEN - MANUAL ENTRY (I-SCREEN)
POC Amphetamine UR: NOT DETECTED
POC Buprenorphine (BUP): NOT DETECTED
POC Cocaine UR: NOT DETECTED
POC Marijuana UR: NOT DETECTED
POC Methadone UR: NOT DETECTED
POC Methamphetamine UR: NOT DETECTED
POC Morphine: NOT DETECTED
POC Oxazepam (BZO): NOT DETECTED
POC Oxycodone UR: NOT DETECTED
POC Secobarbital (BAR): NOT DETECTED

## 2020-04-28 LAB — COMPREHENSIVE METABOLIC PANEL
ALT: 32 U/L (ref 0–44)
AST: 45 U/L — ABNORMAL HIGH (ref 15–41)
Albumin: 4.4 g/dL (ref 3.5–5.0)
Alkaline Phosphatase: 49 U/L (ref 38–126)
Anion gap: 13 (ref 5–15)
BUN: 5 mg/dL — ABNORMAL LOW (ref 6–20)
CO2: 23 mmol/L (ref 22–32)
Calcium: 9.5 mg/dL (ref 8.9–10.3)
Chloride: 97 mmol/L — ABNORMAL LOW (ref 98–111)
Creatinine, Ser: 0.93 mg/dL (ref 0.61–1.24)
GFR, Estimated: >60 mL/min (ref >60)
Glucose, Bld: 114 mg/dL — ABNORMAL HIGH (ref 70–99)
Potassium: 3.6 mmol/L (ref 3.5–5.1)
Sodium: 133 mmol/L — ABNORMAL LOW (ref 135–145)
Total Bilirubin: 0.6 mg/dL (ref 0.3–1.2)
Total Protein: 7.6 g/dL (ref 6.5–8.1)

## 2020-04-28 LAB — LIPID PANEL
Cholesterol: 160 mg/dL (ref 0–200)
HDL: 50 mg/dL (ref >40)
LDL Cholesterol: 101 mg/dL — ABNORMAL HIGH (ref 0–99)
Total CHOL/HDL Ratio: 3.2 RATIO
Triglycerides: 44 mg/dL (ref <150)
VLDL: 9 mg/dL (ref 0–40)

## 2020-04-28 LAB — RESPIRATORY PANEL BY RT PCR (FLU A&B, COVID)
Influenza A by PCR: NEGATIVE
Influenza B by PCR: NEGATIVE
SARS Coronavirus 2 by RT PCR: NEGATIVE

## 2020-04-28 LAB — TSH: TSH: 0.406 u[IU]/mL (ref 0.350–4.500)

## 2020-04-28 LAB — POC SARS CORONAVIRUS 2 AG: SARS Coronavirus 2 Ag: NEGATIVE

## 2020-04-28 LAB — ETHANOL: Alcohol, Ethyl (B): <10 mg/dL (ref <10)

## 2020-04-28 MED ORDER — BENZTROPINE MESYLATE 1 MG PO TABS
1.0000 mg | ORAL_TABLET | Freq: Two times a day (BID) | ORAL | Status: DC
  Administered 2020-04-28 – 2020-04-29 (×3): 1 mg via ORAL
  Filled 2020-04-28: qty 14
  Filled 2020-04-28 (×3): qty 1

## 2020-04-28 MED ORDER — ACETAMINOPHEN 325 MG PO TABS: 650.0000 mg | ORAL_TABLET | Freq: Four times a day (QID) | ORAL | Status: DC | PRN

## 2020-04-28 MED ORDER — ALUM & MAG HYDROXIDE-SIMETH 200-200-20 MG/5ML PO SUSP: 30.0000 mL | ORAL | Status: DC | PRN

## 2020-04-28 MED ORDER — TRAZODONE HCL 50 MG PO TABS
50.0000 mg | ORAL_TABLET | Freq: Every day | ORAL | Status: DC
  Administered 2020-04-28: 50 mg via ORAL
  Filled 2020-04-28: qty 7
  Filled 2020-04-28: qty 1

## 2020-04-28 MED ORDER — MAGNESIUM HYDROXIDE 400 MG/5ML PO SUSP: 30.0000 mL | Freq: Every day | ORAL | Status: DC | PRN

## 2020-04-28 MED ORDER — LORAZEPAM 1 MG PO TABS
2.0000 mg | ORAL_TABLET | Freq: Four times a day (QID) | ORAL | Status: DC | PRN
  Administered 2020-04-28: 2 mg via ORAL
  Filled 2020-04-28: qty 2

## 2020-04-28 MED ORDER — HALOPERIDOL 5 MG PO TABS
10.0000 mg | ORAL_TABLET | Freq: Two times a day (BID) | ORAL | Status: DC
  Administered 2020-04-28 – 2020-04-29 (×3): 10 mg via ORAL
  Filled 2020-04-28 (×2): qty 2
  Filled 2020-04-28: qty 28
  Filled 2020-04-28: qty 2

## 2020-04-28 NOTE — BH Assessment (Signed)
Comprehensive Clinical Assessment (CCA) Note  04/28/2020 Todd Shaw [id number]   Patient is a 34 year old male presenting voluntarily to [facility] for assessment. Patient BIB mother, [name], for open access at [facility] outpatient. Per mother and staff report patient became agitated and paranoid while waiting. When mother stated possible IVC patient ran out of the building. His mother was able to convince him to return. Upon this counselor's exam patient appears anxious and renders limited history. He is cooperative during assessment, however appears to be thought blocking. Patient states he has not taken his medication in "months" and that he has had little to no sleep for several days. He denies current SI/HI. Patient does report AVH but does not expand on this. Patient gives verbal consent for TTS to speak with his mother, [name], for collateral information.  Per collateral: Patient has not been taking his psychiatric medication. Patient does not sleep, talking nonsensically, and reports VH. She states patient became agitated today while waiting for assessment.  Visit Diagnosis:   F31.2 Bipolar I, current episode manic, severe, with psychotic features  Reola Calkins, PMHNP recommends patient be admitted to continuous assessment and medications restarted.   ICD-10-CM   1. Severe manic bipolar 1 disorder with psychotic behavior (HCC)  F31.2       CCA Screening, Triage and Referral (STR)  Patient Reported Information How did you hear about [place]? No data recorded Referral name: No data recorded Referral phone number: No data recorded  Whom do you see for routine medical problems? No data recorded Practice/Facility Name: No data recorded Practice/Facility Phone Number: No data recorded Name of Contact: No data recorded Contact Number: No data recorded Contact Fax Number: No data recorded Prescriber Name: No data recorded Prescriber Address (if known): No data recorded  What Is the Reason  for Your Visit/Call Today? Need meds, have not had them  How Long Has This Been Causing You Problems? No data recorded What Do You Feel Would Help You the Most Today? No data recorded  Have You Recently Been in Any Inpatient Treatment (Hospital/Detox/Crisis Center/28-Day Program)? No data recorded Name/Location of Program/Hospital:No data recorded How Long Were You There? No data recorded When Were You Discharged? No data recorded  Have You Ever Received Services From [facility] Before? No data recorded Who Do You See at [facility]? No data recorded  Have You Recently Had Any Thoughts About Hurting Yourself? No data recorded Are You Planning to Commit Suicide/Harm Yourself At This time? No data recorded  Have you Recently Had Thoughts About Hurting Someone [name]? No data recorded Explanation: No data recorded  Have You Used Any Alcohol or Drugs in the Past 24 Hours? No data recorded How Long Ago Did You Use Drugs or Alcohol? No data recorded What Did You Use and How Much? No data recorded  Do You Currently Have a Therapist/Psychiatrist? No data recorded Name of Therapist/Psychiatrist: No data recorded  Have You Been Recently Discharged From Any Office Practice or Programs? No data recorded Explanation of Discharge From Practice/Program: No data recorded    CCA Screening Triage Referral Assessment Type of Contact: Face-to-Face  Is this Initial or Reassessment? No data recorded Date Telepsych consult ordered in [facility]:  No data recorded Time Telepsych consult ordered in [facility]:  No data recorded  Patient Reported Information Reviewed? Yes  Patient Left Without Being Seen? No data recorded Reason for Not Completing Assessment: No data recorded  Collateral Involvement: motherLadonna Snide   Does Patient Have a Court  Appointed Legal Guardian? No data recorded Name and Contact of Legal Guardian: No data recorded If Minor and Not Living with Parent(s), Who has Custody? No data  recorded Is CPS involved or ever been involved? Never  Is APS involved or ever been involved? Never   Patient Determined To Be At Risk for Harm To Self or Others Based on Review of Patient Reported Information or Presenting Complaint? Yes, for Self-Harm  Method: No data recorded Availability of Means: No data recorded Intent: No data recorded Notification Required: No data recorded Additional Information for Danger to Others Potential: No data recorded Additional Comments for Danger to Others Potential: No data recorded Are There Guns or Other Weapons in Your Home? No data recorded Types of Guns/Weapons: No data recorded Are These Weapons Safely Secured?                            No data recorded Who Could Verify You Are Able To Have These Secured: No data recorded Do You Have any Outstanding Charges, Pending Court Dates, Parole/Probation? No data recorded Contacted To Inform of Risk of Harm To Self or Others: Family/Significant Other:   Location of Assessment: [facility]   Does Patient Present under Involuntary Commitment? No  IVC Papers Initial File Date: No data recorded  [place] of Residence: [place]   Patient Currently Receiving the Following Services: Not Receiving Services   Determination of Need: Emergent (2 hours)   Options For Referral: Inpatient Hospitalization     CCA Biopsychosocial  Intake/Chief Complaint:  CCA Intake With Chief Complaint CCA Part Two Date: 04/28/20 CCA Part Two Time: 1245 Chief Complaint/Presenting Problem: NA Patient's Currently Reported Symptoms/Problems: NA Individual's Strengths: NA Individual's Preferences: NA Individual's Abilities: NA Type of Services Patient Feels Are Needed: NA Initial Clinical Notes/Concerns: NA  Mental Health Symptoms Depression:  Depression: Change in energy/activity, Difficulty Concentrating, Duration of symptoms greater than two weeks  Mania:  Mania: Change in energy/activity,  Increased Energy, Irritability, Racing thoughts, Recklessness  Anxiety:   Anxiety: None  Psychosis:  Psychosis: Hallucinations, Delusions  Trauma:  Trauma: None  Obsessions:  Obsessions: None  Compulsions:  Compulsions: None  Inattention:  Inattention: None  Hyperactivity/Impulsivity:  Hyperactivity/Impulsivity: N/A  Oppositional/Defiant Behaviors:  Oppositional/Defiant Behaviors: N/A  Emotional Irregularity:  Emotional Irregularity: N/A  Other Mood/Personality Symptoms:      Mental Status Exam Appearance and self-care  Stature:  Stature: Average  Weight:  Weight: Average weight  Clothing:  Clothing: Neat/clean  Grooming:  Grooming: Normal  Cosmetic use:  Cosmetic Use: None  Posture/gait:  Posture/Gait: Bizarre  Motor activity:  Motor Activity: Not Remarkable  Sensorium  Attention:  Attention: Distractible  Concentration:  Concentration: Preoccupied, Scattered  Orientation:  Orientation: X5  Recall/memory:  Recall/Memory: Normal  Affect and Mood  Affect:  Affect: Labile  Mood:  Mood: Anxious  Relating  Eye contact:  Eye Contact: Fleeting  Facial expression:  Facial Expression: Constricted  Attitude toward examiner:  Attitude Toward Examiner: Cooperative, Guarded  Thought and Language  Speech flow: Speech Flow: Blocked  Thought content:  Thought Content: Suspicious  Preoccupation:  Preoccupations: None  Hallucinations:  Hallucinations: Auditory, Visual  Organization:     Company secretary of Knowledge:  Fund of Knowledge: Good  Intelligence:  Intelligence: Average  Abstraction:  Abstraction: Normal  Judgement:  Judgement: Impaired  Reality Testing:  Reality Testing: Distorted  Insight:  Insight: Poor  Decision Making:  Decision Making: Impulsive  Social Functioning  Social Maturity:  Social Maturity: Impulsive  Social Judgement:  Social Judgement: Heedless  Stress  Stressors:  Stressors: Illness, Family conflict  Coping Ability:  Coping Ability: Deficient  supports  Skill Deficits:  Skill Deficits: Communication, Interpersonal, Responsibility  Supports:  Supports: Family     Religion: Religion/Spirituality Are You A Religious Person?: No  Leisure/Recreation: Leisure / Recreation Do You Have Hobbies?: No  Exercise/Diet: Exercise/Diet Do You Exercise?: No Have You Gained or Lost A Significant Amount of Weight in the Past Six Months?: No Do You Follow a Special Diet?: No Do You Have Any Trouble Sleeping?: Yes Explanation of Sleeping Difficulties: reports little to no sleep for several days   CCA Employment/Education  Employment/Work Situation: Employment / Work Situation Employment situation: Unemployed Patient's job has been impacted by current illness: Yes What is the longest time patient has a held a job?: Pt cannot recall the longest that he's ever held a job however he does report that every time he has a job he ends up losing it because of mental health concerns. "It's a pattern for me." Where was the patient employed at that time?: NA Has patient ever been in the [employer] and Companymilitary?: No  Education: Education Is Patient Currently Attending School?: No Last Grade Completed: 12 Did You Graduate From [employer]?: Yes Did You Attend College?: No Did You Attend Graduate School?: No Did You Have An Individualized Education Program (IIEP): No Did You Have Any Difficulty At School?: No Patient's Education Has Been Impacted by Current Illness: No   CCA Family/Childhood History  Family and Relationship History: Family history Are you sexually active?: No What is your sexual orientation?: Heterosexual  Has your sexual activity been affected by drugs, alcohol, medication, or emotional stress?: "Not that I know of" Does patient have children?: No  Childhood History:  Childhood History By whom was/is the patient raised?: Mother Additional childhood history information: "Pretty good. I have a pretty good decent life  honestly." Description of patient's relationship with caregiver when they were a child: "Pretty good. Close relationship." How were you disciplined when you got in trouble as a child/adolescent?: 'I used to get whoopings. No time-out or nothing like that." Does patient have siblings?: Yes Number of Siblings: 1 Description of patient's current relationship with siblings: sister Did patient suffer any verbal/emotional/physical/sexual abuse as a child?: No Did patient suffer from severe childhood neglect?: No Has patient ever been sexually abused/assaulted/raped as an adolescent or adult?: No Was the patient ever a victim of a crime or a disaster?: No Witnessed domestic violence?: No Has patient been affected by domestic violence as an adult?: No  Child/Adolescent Assessment:     CCA Substance Use  Alcohol/Drug Use: Alcohol / Drug Use Pain Medications: See MAR Prescriptions: See MAR Over the Counter: See MAR History of alcohol / drug use?: No history of alcohol / drug abuse Longest period of sobriety (when/how long): States that he no longer uses drugs, drug use history unavailable Negative Consequences of Use: Personal relationships Withdrawal Symptoms:  (None Reported)                         ASAM's:  Six Dimensions of Multidimensional Assessment  Dimension 1:  Acute Intoxication and/or Withdrawal Potential:      Dimension 2:  Biomedical Conditions and Complications:      Dimension 3:  Emotional, Behavioral, or Cognitive Conditions and Complications:     Dimension 4:  Readiness to Change:  Dimension 5:  Relapse, Continued use, or Continued Problem Potential:     Dimension 6:  Recovery/Living Environment:     ASAM Severity Score:    ASAM Recommended Level of Treatment:     Substance use Disorder (SUD)    Recommendations for Services/Supports/Treatments:    DSM5 Diagnoses: Patient Active Problem List   Diagnosis Date Noted  . Bipolar disorder with  psychotic features (HCC) 10/07/2019  . Closed avulsion fracture of lesser trochanter of femur, left, initial encounter (HCC) 07/02/2019  . Severe manic bipolar 1 disorder with psychotic behavior (HCC) 08/25/2018  . Compartment syndrome (HCC) 12/31/2016  . Cannabis use disorder, moderate, dependence (HCC) 11/11/2015  . Attention deficit hyperactivity disorder (ADHD) 09/13/2015  . Tobacco use disorder 09/13/2015    Patient Centered Plan: Patient is on the following Treatment Plan(s):    Referrals to Alternative Service(s): Referred to Alternative Service(s):   Place:   Date:   Time:    Referred to Alternative Service(s):   Place:   Date:   Time:    Referred to Alternative Service(s):   Place:   Date:   Time:    Referred to Alternative Service(s):   Place:   Date:   Time:     Celedonio Miyamoto

## 2020-04-28 NOTE — Progress Notes (Signed)
Received Todd Shaw to the OBS unit after Covid and Blood work was completed. He was cooperative with the admission process. He was medicated per order and compliant. He endorsed feeling depressed, anxious and SI  Without a plan. He immediately laid down on his chair bed and drifted off to sleep.

## 2020-04-28 NOTE — ED Notes (Signed)
Pt sleeping at present, no distress noted, monitoring for safety. 
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
## 2020-04-29 LAB — HEMOGLOBIN A1C
Hgb A1c MFr Bld: 5.6 % (ref 4.8–5.6)
Mean Plasma Glucose: 114 mg/dL

## 2020-04-29 MED ORDER — TRAZODONE HCL 50 MG PO TABS: 50.0000 mg | ORAL_TABLET | Freq: Every day | ORAL | 1 refills | Status: DC

## 2020-04-29 MED ORDER — HALOPERIDOL 10 MG PO TABS: 10.0000 mg | ORAL_TABLET | Freq: Two times a day (BID) | ORAL | 1 refills | Status: DC

## 2020-04-29 MED ORDER — BENZTROPINE MESYLATE 1 MG PO TABS: 1.0000 mg | ORAL_TABLET | Freq: Two times a day (BID) | ORAL | 1 refills | Status: DC

## 2020-04-29 NOTE — ED Notes (Signed)
Pt sleeping at present, no distress noted, monitoring for safety. 

## 2020-04-29 NOTE — ED Notes (Signed)
Pt sitting up in no acute distress. Calm, cooperative with staff. Denies SI/HI. Denies A/VH. Informed pt to notify staff with any needs or concerns. Safety maintained.

## 2020-04-29 NOTE — Discharge Instructions (Signed)
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
## 2020-04-29 NOTE — ED Notes (Signed)
Pt sleeping in no acute distress. Will continue to monitor for safety. 

## 2020-04-29 NOTE — ED Notes (Signed)
Pt took am medication without difficulty. Pleasant and cooperative with staff. Informed pt to notify staff with any needs or concerns. Safety maintained.

## 2020-04-29 NOTE — ED Notes (Addendum)
Patient A&O x 4, ambulatory. Patient discharged in no acute distress. Patient denied SI/HI, A/VH upon discharge. Patient verbalized understanding of all discharge instructions explained by staff, to include follow up appointments, RX's and safety plan. Patient reported mood 10/10.  Pt belongings returned to patient from locker #31 intact. Patient escorted to lobby via staff (to pt's mother) for transport to destination. Safety maintained.

## 2020-06-10 ENCOUNTER — Encounter (HOSPITAL_COMMUNITY): Payer: Self-pay

## 2020-06-10 ENCOUNTER — Other Ambulatory Visit: Payer: Self-pay

## 2020-06-10 ENCOUNTER — Ambulatory Visit (HOSPITAL_COMMUNITY)
Admission: EM | Admit: 2020-06-10 | Discharge: 2020-06-10 | Disposition: A | Discharge status: Home / Self Care | Payer: Self-pay | Attending: Family Medicine | Admitting: Family Medicine

## 2020-06-10 DIAGNOSIS — F17210 Nicotine dependence, cigarettes, uncomplicated: Secondary | ICD-10-CM | POA: Insufficient documentation

## 2020-06-10 DIAGNOSIS — Z20822 Contact with and (suspected) exposure to covid-19: Secondary | ICD-10-CM | POA: Insufficient documentation

## 2020-06-10 DIAGNOSIS — R0602 Shortness of breath: Secondary | ICD-10-CM | POA: Insufficient documentation

## 2020-06-10 DIAGNOSIS — J209 Acute bronchitis, unspecified: Secondary | ICD-10-CM | POA: Insufficient documentation

## 2020-06-10 DIAGNOSIS — R059 Cough, unspecified: Secondary | ICD-10-CM | POA: Insufficient documentation

## 2020-06-10 LAB — RESP PANEL BY RT-PCR (FLU A&B, COVID) ARPGX2
Influenza A by PCR: NEGATIVE
Influenza B by PCR: NEGATIVE
SARS Coronavirus 2 by RT PCR: NEGATIVE

## 2020-06-10 MED ORDER — PREDNISONE 20 MG PO TABS: 20.0000 mg | ORAL_TABLET | Freq: Every day | ORAL | 0 refills | Status: AC

## 2020-06-10 MED ORDER — ALBUTEROL SULFATE HFA 108 (90 BASE) MCG/ACT IN AERS
INHALATION_SPRAY | RESPIRATORY_TRACT | Status: AC
  Filled 2020-06-10: qty 6.7

## 2020-06-10 MED ORDER — ALBUTEROL SULFATE HFA 108 (90 BASE) MCG/ACT IN AERS: 2.0000 | INHALATION_SPRAY | Freq: Once | RESPIRATORY_TRACT | Status: DC

## 2020-06-10 MED ORDER — PROMETHAZINE-DM 6.25-15 MG/5ML PO SYRP: 5.0000 mL | ORAL_SOLUTION | Freq: Four times a day (QID) | ORAL | 0 refills | Status: AC | PRN

## 2020-06-10 NOTE — ED Triage Notes (Signed)
Pt presents with non productive cough and nasal drainage X 2 days. 

## 2020-06-10 NOTE — ED Provider Notes (Signed)
[facility]    CSN: [id number] Arrival date & time: 06/10/20  2774      History   Chief Complaint Chief Complaint  Patient presents with  . Cough    HPI Todd Shaw is a 34 y.o. male.   HPI  Patient presents today with 2 to 3 days of nasal congestion, severe coughing and shortness of breath.  Patient is a daily smoker.  He is unvaccinated against COVID-19 and influenza.  Denies any known sick contacts.  He is not experiencing any chest pain but endorses just increased work of breathing.  He denies any formal diagnosis of COPD or asthma.  He has remained afebrile and denies any body aches.   Past Medical History:  Diagnosis Date  . Bipolar 1 disorder (HCC)   . Hallucinations   . Psychosis [facility])     Patient Active Problem List   Diagnosis Date Noted  . Bipolar disorder with psychotic features (HCC) 10/07/2019  . Closed avulsion fracture of lesser trochanter of femur, left, initial encounter (HCC) 07/02/2019  . Severe manic bipolar 1 disorder with psychotic behavior (HCC) 08/25/2018  . Compartment syndrome (HCC) 12/31/2016  . Cannabis use disorder, moderate, dependence (HCC) 11/11/2015  . Attention deficit hyperactivity disorder (ADHD) 09/13/2015  . Tobacco use disorder 09/13/2015    Past Surgical History:  Procedure Laterality Date  . APPLICATION OF A-CELL OF EXTREMITY Right 01/08/2017   Procedure: APPLICATION OF A-CELL RIGHT LOWER LEG;  Surgeon: Glenna Fellows, MD;  Location: [facility] OR;  Service: Plastics;  Laterality: Right;  . APPLICATION OF WOUND VAC Right 01/01/2017   Procedure: RIGHT LEG WOUND VAC CHANGE with irrigation and DEBRIDEMENT of right leg fasciotomy site;  Surgeon: Larina Earthly, MD;  Location: [facility] OR;  Service: Vascular;  Laterality: Right;  . APPLICATION OF WOUND VAC Right 01/03/2017   Procedure: WOUND VAC CHANGE;  Surgeon: Sherren Kerns, MD;  Location[facility] OR;  Service: Vascular;  Laterality: Right;  . FASCIOTOMY Right 12/31/2016    Procedure: FASCIOTOMY, RIGHT LOWER EXTREMITY COMPARTMENT;  Surgeon: Maeola Harman, MD;  Location: [facility] OR;  Service: Vascular;  Laterality: Right;  . I & D EXTREMITY Right 01/03/2017   Procedure: RIGHT LOWER EXTREMITY FASCIOTOMY WASH-OUT. DEBRIDEMENT;  Surgeon: Sherren Kerns, MD;  Location: [facility] OR;  Service: Vascular;  Laterality: Right;  . SKIN SPLIT GRAFT Right 01/08/2017   Procedure: SKIN GRAFT SPLIT THICKNESS from right thigh to right leg;  Surgeon: Glenna Fellows, MD;  Location: [facility] OR;  Service: Plastics;  Laterality: Right;       Home Medications    Prior to Admission medications   Medication Sig Start Date End Date Taking? Authorizing Provider  benztropine (COGENTIN) 1 MG tablet Take 1 tablet (1 mg total) by mouth 2 (two) times daily. 04/29/20   Money, Gerlene Burdock, FNP  haloperidol (HALDOL) 10 MG tablet Take 1 tablet (10 mg total) by mouth 2 (two) times daily. 04/29/20   Money, Gerlene Burdock, FNP  traZODone (DESYREL) 50 MG tablet Take 1 tablet (50 mg total) by mouth at bedtime. 04/29/20   Money, Gerlene Burdock, FNP    Family History Family History  Problem Relation Age of Onset  . Mental illness Neg Hx     Social History Social History   Tobacco Use  . Smoking status: Current Every Day Smoker    Packs/day: 1.00    Types: Cigarettes  . Smokeless tobacco: Never Used  Vaping Use  . Vaping Use: Never used  Substance  Use Topics  . Alcohol use: No    Comment: occas  . Drug use: Yes    Types: Benzodiazepines, Marijuana     Allergies   Patient has no known allergies.   Review of Systems Review of Systems Pertinent negatives listed in HPI  Physical Exam Triage Vital Signs ED Triage Vitals  Enc Vitals Group     BP 06/10/20 0946 (!) 147/82     Pulse Rate 06/10/20 0946 96     Resp 06/10/20 0946 17     Temp 06/10/20 0946 98.6 F (37 C)     Temp Source 06/10/20 0946 Oral     SpO2 06/10/20 0946 99 %     Weight --      Height --      Head Circumference --       Peak Flow --      Pain Score 06/10/20 0947 1     Pain Loc --      Pain Edu? --      Excl. in GC? --    No data found.  Updated Vital Signs BP (!) 147/82 (BP Location: Right Arm)   Pulse 96   Temp 98.6 F (37 C) (Oral)   Resp 17   SpO2 99%   Visual Acuity Right Eye Distance:   Left Eye Distance:   Bilateral Distance:    Right Eye Near:   Left Eye Near:    Bilateral Near:     Physical Exam General appearance: alert, well developed, well nourished, cooperative and in no distress Head: Normocephalic, without obvious abnormality, atraumatic Respiratory: Respirations even and unlabored, normal respiratory rate Heart: rate and rhythm normal. No gallop or murmurs noted on exam  Abdomen: BS +, no distention, no rebound tenderness, or no mass Extremities: No gross deformities Skin: Skin color, texture, turgor normal. No rashes seen  Psych: Appropriate mood and affect. Neurologic: Mental status: Alert, oriented to person, place, and time, thought content appropriate.  UC Treatments / Results  Labs (all labs ordered are listed, but only abnormal results are displayed) Labs Reviewed - No data to display  EKG   Radiology No results found.  Procedures Procedures (including critical care time)  Medications Ordered in UC Medications - No data to display  Initial Impression / Assessment and Plan / UC Course  I have reviewed the triage vital signs and the nursing notes.  Pertinent labs & imaging results that were available during my care of the patient were reviewed by me and considered in my medical decision making (see chart for details).    Treating for acute bronchitis with shortness of breath. Prednisone 20 mg daily for 5 days.  Albuterol inhaler administered here in clinic 2 puffs every 4-6 hours as needed for shortness of breath.  For cough Promethazine DM.  Given current pandemic and patient is unvaccinated respiratory panel obtained to rule out Covid or flu. Final  Clinical Impressions(s) / UC Diagnoses   Final diagnoses:  Acute bronchitis, unspecified organism  SOB (shortness of breath)   Discharge Instructions   None    ED Prescriptions    Medication Sig Dispense Auth. Provider   predniSONE (DELTASONE) 20 MG tablet Take 1 tablet (20 mg total) by mouth daily with breakfast for 5 days. 5 tablet Bing Neighbors, FNP   promethazine-dextromethorphan (PROMETHAZINE-DM) 6.25-15 MG/5ML syrup Take 5 mLs by mouth 4 (four) times daily as needed for cough. 140 mL Bing Neighbors, FNP     PDMP not reviewed this  encounter.   Bing Neighbors, FNP 06/10/20 1103

## 2020-07-01 ENCOUNTER — Other Ambulatory Visit: Payer: Self-pay

## 2020-07-01 ENCOUNTER — Encounter (HOSPITAL_COMMUNITY): Payer: Self-pay | Admitting: Physician Assistant

## 2020-07-01 ENCOUNTER — Ambulatory Visit (INDEPENDENT_AMBULATORY_CARE_PROVIDER_SITE_OTHER): Payer: No Payment, Other | Admitting: Physician Assistant

## 2020-07-01 VITALS — BP 133/79 | HR 93 | Ht 68.0 in | Wt 208.0 lb

## 2020-07-01 DIAGNOSIS — F3160 Bipolar disorder, current episode mixed, unspecified: Secondary | ICD-10-CM

## 2020-07-01 DIAGNOSIS — F5105 Insomnia due to other mental disorder: Secondary | ICD-10-CM | POA: Diagnosis not present

## 2020-07-01 DIAGNOSIS — F99 Mental disorder, not otherwise specified: Secondary | ICD-10-CM

## 2020-07-01 MED ORDER — BENZTROPINE MESYLATE 1 MG PO TABS: 1.0000 mg | ORAL_TABLET | Freq: Two times a day (BID) | ORAL | 2 refills | Status: DC

## 2020-07-01 MED ORDER — HALOPERIDOL 10 MG PO TABS: 10.0000 mg | ORAL_TABLET | Freq: Two times a day (BID) | ORAL | 2 refills | Status: DC

## 2020-07-01 MED ORDER — TRAZODONE HCL 50 MG PO TABS: 50.0000 mg | ORAL_TABLET | Freq: Every day | ORAL | 2 refills | Status: DC

## 2020-07-01 NOTE — Progress Notes (Signed)
Psychiatric Initial Adult Assessment   Patient Identification: Todd Shaw MRN:  [id number] Date of Evaluation:  07/01/2020 Referral Source: [facility] Chief Complaint:  Pick up medications/Medication refill Visit Diagnosis:    ICD-10-CM   1. Bipolar affective disorder, current episode mixed, current episode severity unspecified (HCC)  F31.60 benztropine (COGENTIN) 1 MG tablet    haloperidol (HALDOL) 10 MG tablet  2. Insomnia due to other mental disorder  F51.05 traZODone (DESYREL) 50 MG tablet   F99     History of Present Illness:  [name] is a 34 year old male with a past psychiatric history significant for bipolar disorder who presents to [facility] for medication refill.  Patient is currently being managed on the following medications:  Benztropine 1 mg 2 times daily Haloperidol 10 mg 2 times daily Trazodone 50 mg at bedtime  Patient reports that he is doing pretty good and has no concerns or issues with his current medication regimen.  Patient endorses no adverse side effects from his medications.  Patient denies stressors, anxiety or depressed mood at this time.  Patient denies suicidal ideations and has no past history of suicide attempt.  Patient denies homicidal ideations.  He further denies auditory or visual hallucinations.  Patient endorses good sleep and receives on average 8 hours of restful sleep each night.  Patient endorses good appetite and eats on average 2 meals per day.  Patient endorses alcohol consumption sparingly.  Patient endorses tobacco use and smokes roughly a pack per day.  Patient denies illicit drug use.  Associated Signs/Symptoms: Depression Symptoms:  psychomotor agitation, difficulty concentrating, loss of energy/fatigue, weight gain, (Hypo) Manic Symptoms:  Elevated Mood, Patient reports that in the past, he would excessively use marijuana and experience sleep  deficits Anxiety Symptoms:  N/A Psychotic Symptoms:  None PTSD Symptoms: Had a traumatic exposure:  Patient did not report an traumatic exposure Had a traumatic exposure in the last month:  N/A Re-experiencing:  None Hypervigilance:  No Hyperarousal:  None Avoidance:  None  Past Psychiatric History:  Bipolar Disorder  Previous Psychotropic Medications: Yes   Substance Abuse History in the last 12 months:  No.  Consequences of Substance Abuse: NA  Past Medical History:  Past Medical History:  Diagnosis Date  . Bipolar 1 disorder (HCC)   . Hallucinations   . Psychosis [facility])     Past Surgical History:  Procedure Laterality Date  . APPLICATION OF A-CELL OF EXTREMITY Right 01/08/2017   Procedure: APPLICATION OF A-CELL RIGHT LOWER LEG;  Surgeon: Glenna Fellows, MD;  [facility] OR;  Service: Plastics;  Laterality: Right;  . APPLICATION OF WOUND VAC Right 01/01/2017   Procedure: RIGHT LEG WOUND VAC CHANGE with irrigation and DEBRIDEMENT of right leg fasciotomy site;  Surgeon: Larina Earthly, MD;  Location[facility] OR;  Service: Vascular;  Laterality: Right;  . APPLICATION OF WOUND VAC Right 01/03/2017   Procedure: WOUND VAC CHANGE;  Surgeon: Sherren Kerns, MD;  Location: [facility] OR;  Service: Vascular;  Laterality: Right;  . FASCIOTOMY Right 12/31/2016   Procedure: FASCIOTOMY, RIGHT LOWER EXTREMITY COMPARTMENT;  Surgeon: Maeola Harman, MD;  Location: [facility] OR;  Service: Vascular;  Laterality: Right;  . I & D EXTREMITY Right 01/03/2017   Procedure: RIGHT LOWER EXTREMITY FASCIOTOMY WASH-OUT. DEBRIDEMENT;  Surgeon: Sherren Kerns, MD;  Location: [facility] OR;  Service: Vascular;  Laterality: Right;  . SKIN SPLIT GRAFT Right 01/08/2017   Procedure: SKIN GRAFT SPLIT THICKNESS from right  thigh to right leg;  Surgeon: Glenna Fellows, MD;  Location: [facility] OR;  Service: Plastics;  Laterality: Right;    Family Psychiatric History:  No past family history of psychiatric illness  Family  History:  Family History  Problem Relation Age of Onset  . Mental illness Neg Hx     Social History:   Social History   Socioeconomic History  . Marital status: Single    Spouse name: Not on file  . Number of children: Not on file  . Years of education: Not on file  . Highest education level: Not on file  Occupational History  . Not on file  Tobacco Use  . Smoking status: Current Every Day Smoker    Packs/day: 1.00    Types: Cigarettes  . Smokeless tobacco: Never Used  Vaping Use  . Vaping Use: Never used  Substance and Sexual Activity  . Alcohol use: No    Comment: occas  . Drug use: Yes    Types: Benzodiazepines, Marijuana  . Sexual activity: Yes  Other Topics Concern  . Not on file  Social History Narrative   ** Merged History Encounter **       Social Determinants of Health   Financial Resource Strain: Not on file  Food Insecurity: Not on file  Transportation Needs: Not on file  Physical Activity: Not on file  Stress: Not on file  Social Connections: Not on file    Additional Social History:  Patient is currently working at [employer] as a Nature conservation officer   Allergies:  No Known Allergies  Metabolic Disorder Labs: Lab Results  Component Value Date   HGBA1C 5.6 04/28/2020   MPG 114 04/28/2020   MPG 114.02 10/08/2019   Lab Results  Component Value Date   PROLACTIN 50.6 (H) 10/08/2019   PROLACTIN 110.3 (H) 11/08/2015   Lab Results  Component Value Date   CHOL 160 04/28/2020   TRIG 44 04/28/2020   HDL 50 04/28/2020   CHOLHDL 3.2 04/28/2020   VLDL 9 04/28/2020   LDLCALC 101 (H) 04/28/2020   LDLCALC 92 10/08/2019   Lab Results  Component Value Date   TSH 0.406 04/28/2020    Therapeutic Level Labs: No results found for: LITHIUM No results found for: CBMZ No results found for: VALPROATE  Current Medications: Current Outpatient Medications  Medication Sig Dispense Refill  . benztropine (COGENTIN) 1 MG tablet Take 1 tablet (1 mg total) by  mouth 2 (two) times daily. 60 tablet 2  . haloperidol (HALDOL) 10 MG tablet Take 1 tablet (10 mg total) by mouth 2 (two) times daily. 60 tablet 2  . promethazine-dextromethorphan (PROMETHAZINE-DM) 6.25-15 MG/5ML syrup Take 5 mLs by mouth 4 (four) times daily as needed for cough. 140 mL 0  . traZODone (DESYREL) 50 MG tablet Take 1 tablet (50 mg total) by mouth at bedtime. 30 tablet 2   No current facility-administered medications for this visit.    Musculoskeletal: Strength & Muscle Tone: within normal limits Gait & Station: normal Patient leans: N/A  Psychiatric Specialty Exam: Review of Systems  Psychiatric/Behavioral: Negative for decreased concentration, dysphoric mood, hallucinations, self-injury, sleep disturbance and suicidal ideas. The patient is not nervous/anxious and is not hyperactive.     Blood pressure 133/79, pulse 93, height 5\' 8"  (1.727 m), weight 208 lb (94.3 kg), SpO2 100 %.Body mass index is 31.63 kg/m.  General Appearance: Well Groomed  Eye Contact:  Good  Speech:  Clear and Coherent and Normal Rate  Volume:  Normal  Mood:  Euthymic  Affect:  Appropriate  Thought Process:  Coherent, Goal Directed and Descriptions of Associations: Intact  Orientation:  Full (Time, Place, and Person)  Thought Content:  WDL and Logical  Suicidal Thoughts:  No  Homicidal Thoughts:  No  Memory:  Immediate;   Good Recent;   Good Remote;   Good  Judgement:  Good  Insight:  Good  Psychomotor Activity:  Normal  Concentration:  Concentration: Good and Attention Span: Good  Recall:  Good  Fund of Knowledge:Good  Language: Good  Akathisia:  NA  Handed:  Right  AIMS (if indicated):  not done  Assets:  Communication Skills Desire for Improvement Housing Vocational/Educational  ADL's:  Intact  Cognition: WNL  Sleep:  Good   Screenings: AIMS   Flowsheet Row [facility] Ad[facility] (Discharged[facility] from  [facility] (Discharged[facility] Admission (Discharged) from OP [facility]  AIMS Total Score 0 0 0 0    AUDIT   Flowsheet Row [facility]) [facility] Ad[facility] (Discharged) [facility] Ad[facility] (Discharged) [facility] Admission (Discharged[facility] from [facility]  Alcohol Use Disorder Identification Test Final Score (AUDIT) 0 0 0 0      Assessment and Plan:  Aurther Loft C. Espinola is a 34 year old male with a past psychiatric history significant for bipolar disorder who presents to [facility] for medication refill. Patient reports that he is doing pretty good and has no concerns or issues with his current medication regimen.  Patient endorses no adverse side effects from his medications.  Patient's medications will be e-prescribed to pharmacy of choice.  1. Bipolar affective disorder, current episode mixed, current episode severity unspecified (HCC)  - benztropine (COGENTIN) 1 MG tablet; Take 1 tablet (1 mg total) by mouth 2 (two) times daily.  Dispense: 60 tablet; Refill: 2 - haloperidol (HALDOL) 10 MG tablet; Take 1 tablet (10 mg total) by mouth 2 (two) times daily.  Dispense: 60 tablet; Refill: 2  2. Insomnia due to other mental disorder  - traZODone (DESYREL) 50 MG tablet; Take 1 tablet (50 mg total) by mouth at bedtime.  Dispense: 30 tablet; Refill: 2  Patient to follow up in 6 weeks  Meta Hatchet, PA 1/31/20227:24 PM

## 2020-08-08 ENCOUNTER — Encounter (HOSPITAL_COMMUNITY): Payer: Self-pay | Admitting: Physician Assistant
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
## 2020-08-09 ENCOUNTER — Encounter (HOSPITAL_COMMUNITY): Payer: Self-pay | Admitting: Physician Assistant

## 2020-08-12 ENCOUNTER — Ambulatory Visit (HOSPITAL_COMMUNITY): Payer: Self-pay | Admitting: Physician Assistant

## 2020-08-30 ENCOUNTER — Telehealth (HOSPITAL_COMMUNITY): Payer: Self-pay | Admitting: *Deleted

## 2020-08-30 ENCOUNTER — Other Ambulatory Visit (HOSPITAL_COMMUNITY): Payer: Self-pay | Admitting: Physician Assistant

## 2020-08-30 DIAGNOSIS — F3160 Bipolar disorder, current episode mixed, unspecified: Secondary | ICD-10-CM

## 2020-08-30 MED ORDER — BENZTROPINE MESYLATE 1 MG PO TABS: 1.0000 mg | ORAL_TABLET | Freq: Two times a day (BID) | ORAL | 2 refills | Status: DC

## 2020-08-30 NOTE — Telephone Encounter (Signed)
Provider was contacted by Rushie Chestnut, RMA regarding patient's refill request. Patient's medication will be sent to pharmacy of choice.

## 2020-08-30 NOTE — Progress Notes (Signed)
Provider was contacted by Direce E McIntyre, RMA regarding patient's refill request. Patient's medication will be sent to pharmacy of choice.

## 2020-08-30 NOTE — Telephone Encounter (Signed)
Refill Request benztropine (COGENTIN) 1 MG tablet 60 tablet 2 07/01/2020    Sig - Route: Take 1 tablet (1 mg total) by mouth 2 (two) times daily

## 2020-09-01 ENCOUNTER — Telehealth (HOSPITAL_COMMUNITY): Payer: Self-pay

## 2020-09-01 NOTE — Telephone Encounter (Signed)
Received a fax from patient's pharmacy ([facility] on [address]) requesting a 90 day supply of his Trazodone 50mg . Medication was sent in on 07/01/20 #30 w/2 refills. Spoke with patient to let him know he has another refill to get him through until his appointment at the end of March. Patient has followup scheduled for 3/24. Patient will let dr know he would like a 90 day supply of his Trazodone at that time

## 2020-09-23 ENCOUNTER — Ambulatory Visit (HOSPITAL_COMMUNITY): Payer: Self-pay | Admitting: Physician Assistant

## 2020-09-27 ENCOUNTER — Telehealth (HOSPITAL_COMMUNITY): Payer: Self-pay | Admitting: *Deleted

## 2020-09-27 NOTE — Telephone Encounter (Signed)
Pharmacy request received for patients Haldol and Trazodone. He should be out of them both and his next appt with provider is on 3/24. Provider out of the office today but will flag him to call in Rx tomorrow when he returns.

## 2020-09-29 ENCOUNTER — Other Ambulatory Visit (HOSPITAL_COMMUNITY): Payer: Self-pay | Admitting: Physician Assistant

## 2020-09-29 DIAGNOSIS — F3160 Bipolar disorder, current episode mixed, unspecified: Secondary | ICD-10-CM

## 2020-09-29 DIAGNOSIS — F5105 Insomnia due to other mental disorder: Secondary | ICD-10-CM

## 2020-09-29 MED ORDER — HALOPERIDOL 10 MG PO TABS: 10.0000 mg | ORAL_TABLET | Freq: Two times a day (BID) | ORAL | 2 refills | Status: DC

## 2020-09-29 MED ORDER — BENZTROPINE MESYLATE 1 MG PO TABS
1.0000 mg | ORAL_TABLET | Freq: Two times a day (BID) | ORAL | 2 refills | Status: DC
Start: 2020-09-29 — End: 2020-12-02

## 2020-09-29 MED ORDER — TRAZODONE HCL 50 MG PO TABS: 50.0000 mg | ORAL_TABLET | Freq: Every day | ORAL | 2 refills | Status: DC

## 2020-09-29 NOTE — Progress Notes (Signed)
Provider was contacted by Todd Luna, RN regarding patient's medication. Patient's medication will be e-prescribed to pharmacy of choice.

## 2020-09-29 NOTE — Telephone Encounter (Signed)
Provider was contacted by Suzanne K Beck, RN regarding patient's medication. Patient's medication will be e-prescribed to pharmacy of choice. 

## 2020-09-30 ENCOUNTER — Ambulatory Visit (INDEPENDENT_AMBULATORY_CARE_PROVIDER_SITE_OTHER): Payer: No Payment, Other | Admitting: Physician Assistant

## 2020-09-30 ENCOUNTER — Encounter (HOSPITAL_COMMUNITY): Payer: Self-pay | Admitting: Physician Assistant

## 2020-09-30 ENCOUNTER — Other Ambulatory Visit: Payer: Self-pay

## 2020-09-30 VITALS — BP 133/84 | HR 81 | Ht 67.0 in | Wt 207.0 lb

## 2020-09-30 DIAGNOSIS — F5105 Insomnia due to other mental disorder: Secondary | ICD-10-CM | POA: Diagnosis not present

## 2020-09-30 DIAGNOSIS — F3160 Bipolar disorder, current episode mixed, unspecified: Secondary | ICD-10-CM | POA: Insufficient documentation

## 2020-09-30 DIAGNOSIS — F99 Mental disorder, not otherwise specified: Secondary | ICD-10-CM | POA: Diagnosis not present

## 2020-09-30 NOTE — Progress Notes (Signed)
[facility] MD/PA/NP OP Progress Note  09/30/2020 5:47 PM Todd Shaw  MRN:  [id number]  Chief Complaint:  Chief Complaint    Medication Management     HPI:   Todd Shaw is a 35 year old male with a past psychiatric history significant for bipolar disorder and insomnia who presents to [facility] for follow-up and medication management.  Patient is currently being managed on the following medications:  Benztropine 1 mg 2 times a day Haloperidol 10 mg 2 times daily Trazodone 50 mg at bedtime  Patient expresses no issues or concerns with his current medication regimen.  Patient stresses that he has been experiencing some anxiety and is unsure of his medications need to be adjusted. Patient also expresses that he wakes up from time to time even when taking trazodone before bedtime.  Patient is interested in adjusting his dosage of trazodone.  Patient has no other issues or concerns at this time.  Patient is pleasant, calm, cooperative, and fully engaged in conversation during the encounter.  Patient reports his mood is a 10 out of 10 and that he is feeling fine.  Patient denies suicidal or homicidal ideations.  He further denies auditory or visual hallucinations and does not appear to be responding to internal/external stimuli.  Patient endorses intermittent sleep and states that he receives on average 6 to 7 hours of sleep each night.  Patient endorses good appetite and eats on average 1-2 meals per day.  Patient states that he consumes alcohol from time to time (roughly 1 drink per week).  Patient endorses tobacco use and states that he smokes every day, a lot.  Patient denies illicit drug use.   Visit Diagnosis:    ICD-10-CM   1. Bipolar affective disorder, current episode mixed, current episode severity unspecified (HCC)  F31.60   2. Insomnia due to other mental disorder  F51.05    F99     Past Psychiatric History:  Bipolar  Disorder Insomnia  Past Medical History:  Past Medical History:  Diagnosis Date  . Bipolar 1 disorder (HCC)   . Hallucinations   . Psychosis [facility])     Past Surgical History:  Procedure Laterality Date  . APPLICATION OF A-CELL OF EXTREMITY Right 01/08/2017   Procedure: APPLICATION OF A-CELL RIGHT LOWER LEG;  Surgeon: Glenna Fellows, MD;  Location: [facility] OR;  Service: Plastics;  Laterality: Right;  . APPLICATION OF WOUND VAC Right 01/01/2017   Procedure: RIGHT LEG WOUND VAC CHANGE with irrigation and DEBRIDEMENT of right leg fasciotomy site;  Surgeon: Larina Earthly, MD;  Location: [facility] OR;  Service: Vascular;  Laterality: Right;  . APPLICATION OF WOUND VAC Right 01/03/2017   Procedure: WOUND VAC CHANGE;  Surgeon: Sherren Kerns, MD;  Location[facility] OR;  Service: Vascular;  Laterality: Right;  . FASCIOTOMY Right 12/31/2016   Procedure: FASCIOTOMY, RIGHT LOWER EXTREMITY COMPARTMENT;  Surgeon: Maeola Harman, MD;  Location: [facility] OR;  Service: Vascular;  Laterality: Right;  . I & D EXTREMITY Right 01/03/2017   Procedure: RIGHT LOWER EXTREMITY FASCIOTOMY WASH-OUT. DEBRIDEMENT;  Surgeon: Sherren Kerns, MD;  Location[facility] OR;  Service: Vascular;  Laterality: Right;  . SKIN SPLIT GRAFT Right 01/08/2017   Procedure: SKIN GRAFT SPLIT THICKNESS from right thigh to right leg;  Surgeon: Glenna Fellows, MD;  Location: [facility] OR;  Service: Plastics;  Laterality: Right;    Family Psychiatric History: No past family history of psychiatric illness  Family History:  Family History  Problem  Relation Age of Onset  . Mental illness Neg Hx     Social History:  Social History   Socioeconomic History  . Marital status: Single    Spouse name: Not on file  . Number of children: Not on file  . Years of education: Not on file  . Highest education level: Not on file  Occupational History  . Not on file  Tobacco Use  . Smoking status: Current Every Day Smoker    Packs/day: 1.00    Types: Cigarettes   . Smokeless tobacco: Never Used  Vaping Use  . Vaping Use: Never used  Substance and Sexual Activity  . Alcohol use: No    Comment: occas  . Drug use: Yes    Types: Benzodiazepines, Marijuana  . Sexual activity: Yes  Other Topics Concern  . Not on file  Social History Narrative   ** Merged History Encounter **       Social Determinants of Health   Financial Resource Strain: Not on file  Food Insecurity: Not on file  Transportation Needs: Not on file  Physical Activity: Not on file  Stress: Not on file  Social Connections: Not on file    Allergies: No Known Allergies  Metabolic Disorder Labs: Lab Results  Component Value Date   HGBA1C 5.6 04/28/2020   MPG 114 04/28/2020   MPG 114.02 10/08/2019   Lab Results  Component Value Date   PROLACTIN 50.6 (H) 10/08/2019   PROLACTIN 110.3 (H) 11/08/2015   Lab Results  Component Value Date   CHOL 160 04/28/2020   TRIG 44 04/28/2020   HDL 50 04/28/2020   CHOLHDL 3.2 04/28/2020   VLDL 9 04/28/2020   LDLCALC 101 (H) 04/28/2020   LDLCALC 92 10/08/2019   Lab Results  Component Value Date   TSH 0.406 04/28/2020   TSH 0.850 10/08/2019    Therapeutic Level Labs: No results found for: LITHIUM No results found for: VALPROATE No components found for:  CBMZ  Current Medications: Current Outpatient Medications  Medication Sig Dispense Refill  . benztropine (COGENTIN) 1 MG tablet Take 1 tablet (1 mg total) by mouth 2 (two) times daily. 60 tablet 2  . haloperidol (HALDOL) 10 MG tablet Take 1 tablet (10 mg total) by mouth 2 (two) times daily. 60 tablet 2  . promethazine-dextromethorphan (PROMETHAZINE-DM) 6.25-15 MG/5ML syrup Take 5 mLs by mouth 4 (four) times daily as needed for cough. 140 mL 0  . traZODone (DESYREL) 50 MG tablet Take 1 tablet (50 mg total) by mouth at bedtime. 30 tablet 2   No current facility-administered medications for this visit.     Musculoskeletal: Strength & Muscle Tone: within normal  limits Gait & Station: normal Patient leans: N/A  Psychiatric Specialty Exam: Review of Systems  Psychiatric/Behavioral: Negative for decreased concentration, dysphoric mood, hallucinations, self-injury, sleep disturbance and suicidal ideas. The patient is not nervous/anxious and is not hyperactive.     Blood pressure 133/84, pulse 81, height 5\' 7"  (1.702 m), weight 207 lb (93.9 kg), SpO2 99 %.Body mass index is 32.42 kg/m.  General Appearance: Fairly Groomed  Eye Contact:  Good  Speech:  Clear and Coherent  Volume:  Normal  Mood:  Euthymic  Affect:  Appropriate  Thought Process:  Coherent, Goal Directed and Descriptions of Associations: Intact  Orientation:  Full (Time, Place, and Person)  Thought Content: WDL and Logical   Suicidal Thoughts:  No  Homicidal Thoughts:  No  Memory:  Immediate;   Good Recent;   Fair  Remote;   Fair  Judgement:  Good  Insight:  Fair  Psychomotor Activity:  Normal  Concentration:  Concentration: Good and Attention Span: Good  Recall:  Good  Fund of Knowledge: Fair  Language: Good  Akathisia:  No  Handed:  Right  AIMS (if indicated): not done  Assets:  Communication Skills Desire for Improvement Housing Vocational/Educational  ADL's:  Intact  Cognition: WNL  Sleep:  Good   Screenings: AIMS   Flowsheet Row [facility] Ad[facility] (Discharged) [facility] Ad[facility] (Discharged) [facility] Admission (Discharged) from OP [facility]  AIMS Total Score 0 0 0 0    AUDIT   Flowsheet Row [facility]) [facility] Ad[facility] (Discharged[facility] Ad[facility] (Discharged) from OP [facility]  Alcohol Use Disorder Identification Test Final Score (AUDIT) 0 0 0 0    GAD-7   Flowsheet Row [facility] Visit [facility] 09/30/2020 in [facility]  Total GAD-7 Score 2    PHQ2-9   Flowsheet Row Office Visit from 09/30/2020 in [facility]  PHQ-2 Total Score 0    Flowsheet Row Office Visit from 09/30/2020 in [facility] Admission (Discharged) from [facility] Ad[facility] (Discharged) from [facility]  C-SSRS RISK CATEGORY No Risk No Risk No Risk       Assessment and Plan:   Shiquan Mathieu. Stonehocker is a 35 year old male with a past psychiatric history significant for bipolar disorder and insomnia who presents to [facility] for follow-up and medication management.  Patient reports no issues or concerns with his current regimen of medications but states that he still is experiencing disruptions in his sleep even when taking trazodone before bedtime.  Patient is interested in adjusting his dose of trazodone.  Patient was recommended adjusting his trazodone dose from 50 mg to 100 mg at bedtime.  Patient was agreeable to recommendation.  Patient was informed that his medications were e-prescribed to pharmacy of choice.  Patient was advised to take 2 tablets of his trazodone (50 mg) to fulfill the 100 mg requirement before bedtime.  Patient was receptive to instruction.  1. Bipolar affective disorder, current episode mixed, current episode severity unspecified (HCC) Patient to continue taking benztropine 1 mg 2 times daily as scheduled for the management of side effects related to Haldol use. Patient to continue taking Haldol 10 mg 2 times daily for the management of  his bipolar disorder  2. Insomnia due to other mental disorder Patient to take trazodone 100 mg at bedtime for the management of his insomnia  Patient to follow up in 2 months  Meta Hatchet, PA 09/30/2020, 5:47 PM
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
## 2020-12-01 ENCOUNTER — Telehealth (HOSPITAL_COMMUNITY): Payer: Self-pay | Admitting: Physician Assistant

## 2020-12-01 NOTE — Telephone Encounter (Signed)
Pt requesting COGENTIN & HALDOL & TRAZODONE refills sent to [id number] - [place], [place] - [address] . Appointment tomorrow 12/02/20 with Karel Jarvis.

## 2020-12-02 ENCOUNTER — Other Ambulatory Visit (HOSPITAL_COMMUNITY): Payer: Self-pay | Admitting: Physician Assistant

## 2020-12-02 ENCOUNTER — Ambulatory Visit (INDEPENDENT_AMBULATORY_CARE_PROVIDER_SITE_OTHER): Payer: No Payment, Other | Admitting: Physician Assistant

## 2020-12-02 ENCOUNTER — Other Ambulatory Visit: Payer: Self-pay

## 2020-12-02 ENCOUNTER — Encounter (HOSPITAL_COMMUNITY): Payer: Self-pay | Admitting: Physician Assistant

## 2020-12-02 VITALS — BP 117/80 | HR 97 | Ht 67.0 in | Wt 210.0 lb

## 2020-12-02 DIAGNOSIS — F5105 Insomnia due to other mental disorder: Secondary | ICD-10-CM | POA: Diagnosis not present

## 2020-12-02 DIAGNOSIS — F3160 Bipolar disorder, current episode mixed, unspecified: Secondary | ICD-10-CM

## 2020-12-02 DIAGNOSIS — F99 Mental disorder, not otherwise specified: Secondary | ICD-10-CM | POA: Diagnosis not present

## 2020-12-02 MED ORDER — BENZTROPINE MESYLATE 1 MG PO TABS: 1.0000 mg | ORAL_TABLET | Freq: Two times a day (BID) | ORAL | 2 refills | Status: DC

## 2020-12-02 MED ORDER — TRAZODONE HCL 50 MG PO TABS
50.0000 mg | ORAL_TABLET | Freq: Every day | ORAL | 2 refills | Status: DC
Start: 2020-12-02 — End: 2021-02-21

## 2020-12-02 MED ORDER — HALOPERIDOL 10 MG PO TABS: 10.0000 mg | ORAL_TABLET | Freq: Two times a day (BID) | ORAL | 2 refills | Status: DC

## 2020-12-02 NOTE — Telephone Encounter (Signed)
Provider was contacted by Carla Bracken regarding patient's medication refill. Patient's medication to be e-prescribed to pharmacy of choice. 

## 2020-12-02 NOTE — Progress Notes (Signed)
Provider was contacted by Lauralee Evener regarding patient's medication refill. Patient's medication to be e-prescribed to pharmacy of choice.

## 2020-12-05 ENCOUNTER — Encounter (HOSPITAL_COMMUNITY): Payer: Self-pay | Admitting: Physician Assistant

## 2020-12-05 NOTE — Progress Notes (Signed)
[facility] MD/PA/NP OP Progress Note  12/02/2020 4:35 PM Todd Shaw  MRN:  [id number]  Chief Complaint:  Chief Complaint    Medication Management     HPI:   Todd Shaw is a 35 year old male with a past psychiatric history significant for bipolar disorder and insomnia who presents to [facility] for follow-up and medication management.  Patient is currently being managed on the following medications:  Benztropine 1 mg 2 times daily Haloperidol 10 mg 2 times daily Trazodone 50 mg at bedtime  Patient reports no issues or concerns with his current medication regimen.  Patient denies a need for dosage adjustments at this time and is requesting no refills during the encounter.  Provider informed patient that refills for his medications were sent earlier today and that he should have no issues with running out of his medications between now and his future appointment.  Patient denies any issues regarding his mental health.  Patient denies new stressors or life changing events.  Patient is pleasant, calm, cooperative, and fully engaged in conversation during the encounter.  Patient reports that his mood is good and that he plans on playing video games after the encounter.  Patient denies suicidal or homicidal ideations.  Patient further denies auditory or visual hallucinations and does not appear to be responding to internal/external stimuli.  Patient endorses good sleep and receives on average 8 hours of sleep each night.  Patient endorses good appetite and eats on average 3 meals per day.  Patient denies excessive alcohol consumption.  Patient endorses tobacco use.  Patient denies illicit drug use.  Visit Diagnosis:    ICD-10-CM   1. Bipolar affective disorder, current episode mixed, current episode severity unspecified (HCC)  F31.60   2. Insomnia due to other mental disorder  F51.05    F99     Past Psychiatric History:  Bipolar  Disorder Insomnia  Past Medical History:  Past Medical History:  Diagnosis Date  . Bipolar 1 disorder (HCC)   . Hallucinations   . Psychosis [facility])     Past Surgical History:  Procedure Laterality Date  . APPLICATION OF A-CELL OF EXTREMITY Right 01/08/2017   Procedure: APPLICATION OF A-CELL RIGHT LOWER LEG;  Surgeon: Glenna Fellows, MD;  Location: [facility] OR;  Service: Plastics;  Laterality: Right;  . APPLICATION OF WOUND VAC Right 01/01/2017   Procedure: RIGHT LEG WOUND VAC CHANGE with irrigation and DEBRIDEMENT of right leg fasciotomy site;  Surgeon: Larina Earthly, MD;  Location: [facility] OR;  Service: Vascular;  Laterality: Right;  . APPLICATION OF WOUND VAC Right 01/03/2017   Procedure: WOUND VAC CHANGE;  Surgeon: Sherren Kerns, MD;  Location: [facility] OR;  Service: Vascular;  Laterality: Right;  . FASCIOTOMY Right 12/31/2016   Procedure: FASCIOTOMY, RIGHT LOWER EXTREMITY COMPARTMENT;  Surgeon: Maeola Harman, MD;  Location: [facility] OR;  Service: Vascular;  Laterality: Right;  . I & D EXTREMITY Right 01/03/2017   Procedure: RIGHT LOWER EXTREMITY FASCIOTOMY WASH-OUT. DEBRIDEMENT;  Surgeon: Sherren Kerns, MD;  Location: [facility] OR;  Service: Vascular;  Laterality: Right;  . SKIN SPLIT GRAFT Right 01/08/2017   Procedure: SKIN GRAFT SPLIT THICKNESS from right thigh to right leg;  Surgeon: Glenna Fellows, MD;  Location: [facility] OR;  Service: Plastics;  Laterality: Right;    Family Psychiatric History:  No past family history of psychiatric illness  Family History:  Family History  Problem Relation Age of Onset  . Mental illness Neg Hx  Social History:  Social History   Socioeconomic History  . Marital status: Single    Spouse name: Not on file  . Number of children: Not on file  . Years of education: Not on file  . Highest education level: Not on file  Occupational History  . Not on file  Tobacco Use  . Smoking status: Current Every Day Smoker    Packs/day: 1.00    Types:  Cigarettes  . Smokeless tobacco: Never Used  Vaping Use  . Vaping Use: Never used  Substance and Sexual Activity  . Alcohol use: No    Comment: occas  . Drug use: Yes    Types: Benzodiazepines, Marijuana  . Sexual activity: Yes  Other Topics Concern  . Not on file  Social History Narrative   ** Merged History Encounter **       Social Determinants of Health   Financial Resource Strain: Not on file  Food Insecurity: Not on file  Transportation Needs: Not on file  Physical Activity: Not on file  Stress: Not on file  Social Connections: Not on file    Allergies: No Known Allergies  Metabolic Disorder Labs: Lab Results  Component Value Date   HGBA1C 5.6 04/28/2020   MPG 114 04/28/2020   MPG 114.02 10/08/2019   Lab Results  Component Value Date   PROLACTIN 50.6 (H) 10/08/2019   PROLACTIN 110.3 (H) 11/08/2015   Lab Results  Component Value Date   CHOL 160 04/28/2020   TRIG 44 04/28/2020   HDL 50 04/28/2020   CHOLHDL 3.2 04/28/2020   VLDL 9 04/28/2020   LDLCALC 101 (H) 04/28/2020   LDLCALC 92 10/08/2019   Lab Results  Component Value Date   TSH 0.406 04/28/2020   TSH 0.850 10/08/2019    Therapeutic Level Labs: No results found for: LITHIUM No results found for: VALPROATE No components found for:  CBMZ  Current Medications: Current Outpatient Medications  Medication Sig Dispense Refill  . benztropine (COGENTIN) 1 MG tablet Take 1 tablet (1 mg total) by mouth 2 (two) times daily. 60 tablet 2  . haloperidol (HALDOL) 10 MG tablet Take 1 tablet (10 mg total) by mouth 2 (two) times daily. 60 tablet 2  . promethazine-dextromethorphan (PROMETHAZINE-DM) 6.25-15 MG/5ML syrup Take 5 mLs by mouth 4 (four) times daily as needed for cough. 140 mL 0  . traZODone (DESYREL) 50 MG tablet Take 1 tablet (50 mg total) by mouth at bedtime. 30 tablet 2   No current facility-administered medications for this visit.     Musculoskeletal: Strength & Muscle Tone: within normal  limits Gait & Station: normal Patient leans: N/A  Psychiatric Specialty Exam: Review of Systems  Psychiatric/Behavioral: Negative for decreased concentration, dysphoric mood, hallucinations, self-injury, sleep disturbance and suicidal ideas. The patient is not nervous/anxious and is not hyperactive.     Blood pressure 117/80, pulse 97, height 5\' 7"  (1.702 m), weight 210 lb (95.3 kg), SpO2 99 %.Body mass index is 32.89 kg/m.  General Appearance: Well Groomed  Eye Contact:  Good  Speech:  Clear and Coherent and Normal Rate  Volume:  Normal  Mood:  Euthymic  Affect:  Appropriate  Thought Process:  Coherent and Descriptions of Associations: Intact  Orientation:  Full (Time, Place, and Person)  Thought Content: WDL   Suicidal Thoughts:  No  Homicidal Thoughts:  No  Memory:  Immediate;   Good Recent;   Fair Remote;   Fair  Judgement:  Good  Insight:  Fair  Psychomotor Activity:  Normal  Concentration:  Concentration: Good and Attention Span: Good  Recall:  Good  Fund of Knowledge: Fair  Language: Good  Akathisia:  No  Handed:  Right  AIMS (if indicated): not done  Assets:  Communication Skills Desire for Improvement Housing Vocational/Educational  ADL's:  Intact  Cognition: WNL  Sleep:  Good   Screenings: AIMS   Flowsheet Row [facility] Ad[facility] (Discharged) from [facility]  AIMS Total Score 0 0 0 0    AUDIT   Flowsheet Row [facility]) [facility] ([facility]) [facility] (Discharged) from [facility]  Alcohol Use Disorder Identification Test Final Score (AUDIT) 0 0 0 0    GAD-7   Flowsheet Row Office Visit from 12/02/2020 in [facility] Office Visit from 09/30/2020 in [facility]  Total GAD-7 Score 0 2    PHQ2-9   Flowsheet Row Office Visit from 12/02/2020 in [facility] Office Visit from 09/30/2020 in [facility]  PHQ-2 Total Score 0 0    Flowsheet Row Office Visit from 12/02/2020 in [facility] Office Visit from 09/30/2020 in [facility] Admission (Discharged) from [facility]  C-SSRS RISK CATEGORY No Risk No Risk No Risk       Assessment and Plan:   Todd Shaw is a 35 year old male with a past psychiatric history significant for bipolar disorder and insomnia who presents to [facility] for follow-up and medication management.  Patient reports no issues or concerns regarding his current medication regimen.  Patient denies the need for dosage adjustments at this time and is requesting no refills on his medications.  Patient to continue taking his medications as prescribed.  1. Bipolar affective disorder, current episode mixed, current episode severity unspecified (HCC) Patient to continue taking benztropine 1 mg 2 times daily as scheduled for the management of side effects related to Haldol use Patient to continue taking Haldol 10 mg 2 times daily for the management of his bipolar disorder  2. Insomnia due to other mental disorder Patient to continue taking trazodone 100 mg at bedtime for the management of insomnia  Patient to follow-up in 2 months  Meta Hatchet, PA 12/02/2020, 4:35 PM

## 2021-02-03 ENCOUNTER — Telehealth (HOSPITAL_COMMUNITY): Payer: Self-pay | Admitting: Physician Assistant

## 2021-02-21 ENCOUNTER — Other Ambulatory Visit (HOSPITAL_COMMUNITY): Payer: Self-pay | Admitting: Physician Assistant

## 2021-02-21 ENCOUNTER — Telehealth (HOSPITAL_COMMUNITY): Payer: Self-pay | Admitting: *Deleted

## 2021-02-21 DIAGNOSIS — F99 Mental disorder, not otherwise specified: Secondary | ICD-10-CM

## 2021-02-21 DIAGNOSIS — F5105 Insomnia due to other mental disorder: Secondary | ICD-10-CM

## 2021-02-21 DIAGNOSIS — F3160 Bipolar disorder, current episode mixed, unspecified: Secondary | ICD-10-CM

## 2021-02-21 MED ORDER — BENZTROPINE MESYLATE 1 MG PO TABS: 1.0000 mg | ORAL_TABLET | Freq: Two times a day (BID) | ORAL | 2 refills | Status: DC

## 2021-02-21 MED ORDER — TRAZODONE HCL 50 MG PO TABS: 50.0000 mg | ORAL_TABLET | Freq: Every day | ORAL | 2 refills | Status: DC

## 2021-02-21 MED ORDER — HALOPERIDOL 10 MG PO TABS: 10.0000 mg | ORAL_TABLET | Freq: Two times a day (BID) | ORAL | 2 refills | Status: DC

## 2021-02-21 NOTE — Telephone Encounter (Signed)
Patient called LVM requesting refill on med's next appt 04/2021

## 2021-02-21 NOTE — Progress Notes (Signed)
Provider was contacted by Direce E McIntyre, RMA regarding medication refill. Patient's medication to be e-prescribed to pharmacy of choice.

## 2021-02-21 NOTE — Telephone Encounter (Signed)
Provider was contacted by Direce E McIntyre, RMA regarding medication refill. Patient's medication to be e-prescribed to pharmacy of choice.

## 2021-04-13 ENCOUNTER — Other Ambulatory Visit: Payer: Self-pay

## 2021-04-13 ENCOUNTER — Telehealth (HOSPITAL_COMMUNITY): Payer: Self-pay | Admitting: Physician Assistant

## 2021-07-06 ENCOUNTER — Encounter (HOSPITAL_COMMUNITY): Payer: Self-pay | Admitting: Physician Assistant

## 2021-07-06 ENCOUNTER — Telehealth (INDEPENDENT_AMBULATORY_CARE_PROVIDER_SITE_OTHER): Payer: No Payment, Other | Admitting: Physician Assistant

## 2021-07-06 DIAGNOSIS — F3160 Bipolar disorder, current episode mixed, unspecified: Secondary | ICD-10-CM | POA: Diagnosis not present

## 2021-07-06 DIAGNOSIS — F99 Mental disorder, not otherwise specified: Secondary | ICD-10-CM | POA: Diagnosis not present

## 2021-07-06 DIAGNOSIS — F5105 Insomnia due to other mental disorder: Secondary | ICD-10-CM | POA: Diagnosis not present

## 2021-07-06 MED ORDER — TRAZODONE HCL 50 MG PO TABS: 50.0000 mg | ORAL_TABLET | Freq: Every day | ORAL | 2 refills | Status: DC

## 2021-07-06 MED ORDER — BENZTROPINE MESYLATE 1 MG PO TABS: 1.0000 mg | ORAL_TABLET | Freq: Two times a day (BID) | ORAL | 2 refills | Status: DC

## 2021-07-06 MED ORDER — HALOPERIDOL 10 MG PO TABS: 10.0000 mg | ORAL_TABLET | Freq: Two times a day (BID) | ORAL | 2 refills | Status: DC

## 2021-07-06 NOTE — Progress Notes (Signed)
[place] MD/PA/NP OP Progress Note  Virtual Visit via Telephone Note  I connected with Todd Shaw on 07/06/21 at  4:30 PM EST by telephone and verified that I am speaking with the correct person using two identifiers.  Location: Patient: Home Provider: Clinic   I discussed the limitations, risks, security and privacy concerns of performing an evaluation and management service by telephone and the availability of in person appointments. I also discussed with the patient that there may be a patient responsible charge related to this service. The patient expressed understanding and agreed to proceed.  Follow Up Instructions:  I discussed the assessment and treatment plan with the patient. The patient was provided an opportunity to ask questions and all were answered. The patient agreed with the plan and demonstrated an understanding of the instructions.   The patient was advised to call back or seek an in-person evaluation if the symptoms worsen or if the condition fails to improve as anticipated.  I provided 10 minutes of non-face-to-face time during this encounter.  Malachy Mood, PA    07/06/2021 7:49 PM Todd Shaw  MRN:  [id number]  Chief Complaint: Follow up and medication management  HPI:   Todd Shaw is a 35 year old male with a past psychiatric history significant for bipolar disorder and insomnia who presents to [facility] via virtual telephone visit for follow-up and medication management.  Patient is currently being managed on the following medications:  Benztropine 1 mg 2 times daily Haldol 10 mg 2 times daily Trazodone 100 mg at bedtime  Patient reports no issues or concerns regarding his current medication regimen.  Patient denies need for dosage adjustments at this time and is requesting refills on all his medications following the conclusion of the encounter.  Patient denies depressive symptoms or anxiety.   Patient further denies any new stressors at this time.  Patient is alert and oriented x4, calm, cooperative, and fully engaged in conversation during the encounter.  Patient endorses good mood.  Patient denies suicidal or homicidal ideations.  He further denies auditory or visual hallucinations and does not appear to be responding to internal/external stimuli.  Patient endorses fair appetite and eats on average 2 meals per day.  Patient denies alcohol consumption and illicit drug use.  Patient endorses tobacco use.  Visit Diagnosis:    ICD-10-CM   1. Bipolar affective disorder, current episode mixed, current episode severity unspecified ([place])  F31.60 benztropine (COGENTIN) 1 MG tablet    haloperidol (HALDOL) 10 MG tablet    2. Insomnia due to other mental disorder  F51.05 traZODone (DESYREL) 50 MG tablet   F99       Past Psychiatric History:  Bipolar Disorder Insomnia  Past Medical History:  Past Medical History:  Diagnosis Date   Bipolar 1 disorder ([place])    Hallucinations    Psychosis ([place])     Past Surgical History:  Procedure Laterality Date   APPLICATION OF A-CELL OF EXTREMITY Right 01/08/2017   Procedure: APPLICATION OF A-CELL RIGHT LOWER LEG;  Surgeon: [name], MD;  Location: [place];  Service: Plastics;  Laterality: Right;   APPLICATION OF WOUND VAC Right 01/01/2017   Procedure: RIGHT LEG WOUND VAC CHANGE with irrigation and DEBRIDEMENT of right leg fasciotomy site;  Surgeon: Rosetta Posner, MD;  Location: [place];  Service: Vascular;  Laterality: Right;   APPLICATION OF WOUND VAC Right 01/03/2017   Procedure: WOUND VAC CHANGE;  Surgeon: Elam Dutch, MD;  [facility];  Service: Vascular;  Laterality: Right;   FASCIOTOMY Right 12/31/2016   Procedure: FASCIOTOMY, RIGHT LOWER EXTREMITY COMPARTMENT;  Surgeon: Maeola Harman, MD;  Location: [facility] OR;  Service: Vascular;  Laterality: Right;   I & D EXTREMITY Right 01/03/2017   Procedure: RIGHT LOWER EXTREMITY  FASCIOTOMY WASH-OUT. DEBRIDEMENT;  Surgeon: Sherren Kerns, MD;  Location: [facility] OR;  Service: Vascular;  Laterality: Right;   SKIN SPLIT GRAFT Right 01/08/2017   Procedure: SKIN GRAFT SPLIT THICKNESS from right thigh to right leg;  Surgeon: Glenna Fellows, MD;  [facility];  Service: Plastics;  Laterality: Right;    Family Psychiatric History:  No past family history of psychiatric illness  Family History:  Family History  Problem Relation Age of Onset   Mental illness Neg Hx     Social History:  Social History   Socioeconomic History   Marital status: Single    Spouse name: Not on file   Number of children: Not on file   Years of education: Not on file   Highest education level: Not on file  Occupational History   Not on file  Tobacco Use   Smoking status: Every Day    Packs/day: 1.00    Types: Cigarettes   Smokeless tobacco: Never  Vaping Use   Vaping Use: Never used  Substance and Sexual Activity   Alcohol use: No    Comment: occas   Drug use: Yes    Types: Benzodiazepines, Marijuana   Sexual activity: Yes  Other Topics Concern   Not on file  Social History Narrative   ** Merged History Encounter **       Social Determinants of Health   Financial Resource Strain: Not on file  Food Insecurity: Not on file  Transportation Needs: Not on file  Physical Activity: Not on file  Stress: Not on file  Social Connections: Not on file    Allergies: No Known Allergies  Metabolic Disorder Labs: Lab Results  Component Value Date   HGBA1C 5.6 04/28/2020   MPG 114 04/28/2020   MPG 114.02 10/08/2019   Lab Results  Component Value Date   PROLACTIN 50.6 (H) 10/08/2019   PROLACTIN 110.3 (H) 11/08/2015   Lab Results  Component Value Date   CHOL 160 04/28/2020   TRIG 44 04/28/2020   HDL 50 04/28/2020   CHOLHDL 3.2 04/28/2020   VLDL 9 04/28/2020   LDLCALC 101 (H) 04/28/2020   LDLCALC 92 10/08/2019   Lab Results  Component Value Date   TSH 0.406  04/28/2020   TSH 0.850 10/08/2019    Therapeutic Level Labs: No results found for: LITHIUM No results found for: VALPROATE No components found for:  CBMZ  Current Medications: Current Outpatient Medications  Medication Sig Dispense Refill   benztropine (COGENTIN) 1 MG tablet Take 1 tablet (1 mg total) by mouth 2 (two) times daily. 60 tablet 2   haloperidol (HALDOL) 10 MG tablet Take 1 tablet (10 mg total) by mouth 2 (two) times daily. 60 tablet 2   promethazine-dextromethorphan (PROMETHAZINE-DM) 6.25-15 MG/5ML syrup Take 5 mLs by mouth 4 (four) times daily as needed for cough. 140 mL 0   traZODone (DESYREL) 50 MG tablet Take 1 tablet (50 mg total) by mouth at bedtime. 30 tablet 2   No current facility-administered medications for this visit.     Musculoskeletal: Strength & Muscle Tone: Unable to assess due to telemedicine visit Gait & Station: Unable to assess due to telemedicine visit Patient leans:  Unable to assess due to telemedicine visit  Psychiatric Specialty Exam: Review of Systems  Psychiatric/Behavioral:  Negative for decreased concentration, dysphoric mood, hallucinations, self-injury, sleep disturbance and suicidal ideas. The patient is not nervous/anxious and is not hyperactive.    There were no vitals taken for this visit.There is no height or weight on file to calculate BMI.  General Appearance: Unable to assess due to telemedicine visit  Eye Contact:  Unable to assess due to telemedicine visit  Speech:  Clear and Coherent and Normal Rate  Volume:  Normal  Mood:  Euthymic  Affect:  Appropriate  Thought Process:  Coherent, Goal Directed, and Descriptions of Associations: Intact  Orientation:  Full (Time, Place, and Person)  Thought Content: WDL   Suicidal Thoughts:  No  Homicidal Thoughts:  No  Memory:  Immediate;   Good Recent;   Good Remote;   Good  Judgement:  Good  Insight:  Good  Psychomotor Activity:  Normal  Concentration:  Concentration: Good and  Attention Span: Good  Recall:  Good  Fund of Knowledge: Fair  Language: Good  Akathisia:  No  Handed:  Right  AIMS (if indicated): not done  Assets:  Communication Skills Desire for Improvement Housing Vocational/Educational  ADL's:  Intact  Cognition: WNL  Sleep:  Good   Screenings: AIMS    Flowsheet Row Admission (Discharged) from 08/25/2018 in [place] [facility] Admission (Discharged) from 11/06/2015 in [place] [facility] Admission (Discharged) from 09/20/2015 in [place] [facility] Admission (Discharged) from OP Visit from 09/13/2015 in [place] [facility]  AIMS Total Score 0 0 0 0      AUDIT    Flowsheet Row Admission (Discharged) from 08/25/2018 in [place] [facility] Admission (Discharged) from 11/06/2015 in [place] [facility] Admission (Discharged) from 09/20/2015 in [place] [facility] Admission (Discharged) from OP Visit from 09/13/2015 in [place] [facility]  Alcohol Use Disorder Identification Test Final Score (AUDIT) 0 0 0 0      GAD-7    Flowsheet Row Video Visit from 07/06/2021 in [facility] Office Visit from 12/02/2020 in [facility] Office Visit from 09/30/2020 in [facility]  Total GAD-7 Score 0 0 2      PHQ2-9    Flowsheet Row Video Visit from 07/06/2021 in [facility] Office Visit from 12/02/2020 in [facility] Office Visit from 09/30/2020 in [facility]  PHQ-2 Total Score 0 0 0      Flowsheet Row Video Visit from 07/06/2021 in [facility] Office Visit from 12/02/2020 in [facility] Office Visit from 09/30/2020 in [place] No Risk No Risk No Risk        Assessment and Plan: \  Todd Shaw is a 35 year old male with a past psychiatric history significant for bipolar disorder and insomnia who presents to [facility] via virtual telephone visit for follow-up and medication management.  Patient reports no issues or concerns regarding his current medication regimen.  Patient denies the need for dosage adjustments at this time and is requesting refills on all his medications following the conclusion of the encounter.  Patient's medications to be e-prescribed to pharmacy of choice.  1. Bipolar affective  disorder, current episode mixed, current episode severity unspecified (HCC)  - benztropine (COGENTIN) 1 MG tablet; Take 1 tablet (1 mg total) by mouth 2 (two) times daily.  Dispense: 60 tablet; Refill: 2 - haloperidol (HALDOL) 10 MG tablet; Take 1 tablet (10 mg total) by mouth 2 (two) times daily.  Dispense: 60 tablet; Refill: 2  2. Insomnia due to other mental disorder  - traZODone (DESYREL) 50 MG tablet; Take 1 tablet (50 mg total) by mouth at bedtime.  Dispense: 30 tablet; Refill: 2  Patient to follow up in 3 months Provider spent a total of 10 minutes with the patient/reviewing patient's chart  Malachy Mood, PA 07/06/2021, 7:49 PM

## 2021-10-06 ENCOUNTER — Encounter (HOSPITAL_COMMUNITY): Payer: Self-pay

## 2021-10-06 ENCOUNTER — Telehealth (HOSPITAL_COMMUNITY): Payer: No Payment, Other | Admitting: Physician Assistant

## 2021-11-02 ENCOUNTER — Ambulatory Visit (INDEPENDENT_AMBULATORY_CARE_PROVIDER_SITE_OTHER): Payer: No Payment, Other | Admitting: Physician Assistant

## 2021-11-02 DIAGNOSIS — F99 Mental disorder, not otherwise specified: Secondary | ICD-10-CM

## 2021-11-02 DIAGNOSIS — F5105 Insomnia due to other mental disorder: Secondary | ICD-10-CM | POA: Diagnosis not present

## 2021-11-02 DIAGNOSIS — F3160 Bipolar disorder, current episode mixed, unspecified: Secondary | ICD-10-CM | POA: Diagnosis not present

## 2021-11-02 MED ORDER — TRAZODONE HCL 50 MG PO TABS: 50.0000 mg | ORAL_TABLET | Freq: Every day | ORAL | 3 refills | Status: AC

## 2021-11-02 MED ORDER — BENZTROPINE MESYLATE 1 MG PO TABS: 1.0000 mg | ORAL_TABLET | Freq: Two times a day (BID) | ORAL | 3 refills | Status: DC

## 2021-11-02 MED ORDER — HALOPERIDOL 10 MG PO TABS: 10.0000 mg | ORAL_TABLET | Freq: Two times a day (BID) | ORAL | 3 refills | Status: DC

## 2021-11-05 ENCOUNTER — Encounter (HOSPITAL_COMMUNITY): Payer: Self-pay | Admitting: Physician Assistant

## 2021-11-05 NOTE — Progress Notes (Signed)
[facility] MD/PA/NP OP Progress Note ? ?Virtual Visit via Telephone Note ? ?I connected with Todd Shaw on 11/01/21 at  5:00 PM EDT by telephone and verified that I am speaking with the correct person using two identifiers. ? ?Location: ?Patient: Home ?Provider: Clinic ?  ?I discussed the limitations, risks, security and privacy concerns of performing an evaluation and management service by telephone and the availability of in person appointments. I also discussed with the patient that there may be a patient responsible charge related to this service. The patient expressed understanding and agreed to proceed. ? ?Follow Up Instructions: ? ?I discussed the assessment and treatment plan with the patient. The patient was provided an opportunity to ask questions and all were answered. The patient agreed with the plan and demonstrated an understanding of the instructions. ?  ?The patient was advised to call back or seek an in-person evaluation if the symptoms worsen or if the condition fails to improve as anticipated. ? ?I provided 6 minutes of non-face-to-face time during this encounter. ? ?Meta HatchetUchenna E Sherria Riemann, PA  ? ? ?11/02/2021 10:27 PM ?Todd Closerry Criquan Halbur  ?MRN:  [id number] ? ?Chief Complaint:  ?Chief Complaint   ?Follow-up; Medication Refill ?  ? ?HPI:  ? ?Linton Hamerry C. Richardson DoppCole is a 36 year old male with a past psychiatric history significant for bipolar disorder and insomnia [facility] virtual telephone visit for follow-up and medication management.  Patient is currently being managed on the following medications: ? ?Benztropine 1 mg 2 times daily ?Haldol 10 mg 2 times daily ?Trazodone 100 mg at bedtime ? ?Patient reports no issues or concerns regarding his current medication regimen.  Patient denies need for dosage adjustments at this time and is requesting refills on all his medications following the conclusion of the encounter.  Patient denies experiencing any  adverse side effects from taking his medications.  Patient denies depressive symptoms nor does he endorse anxiety.  Patient denies changes in mood/mood swings.  Patient denies any new stressors at this time. ? ?Patient is alert and oriented x4, pleasant, calm, cooperative, and fully engaged in conversation during the encounter.  Patient endorses feeling happy every day.  Patient denies suicidal or homicidal ideations.  He further denies auditory or visual hallucinations and does not appear to be responding to internal/external stimuli.  Patient endorses good sleep and receives on average 8 hours of sleep each night.  Patient endorses fair appetite and eats on average 2 meals per day.  Patient denies alcohol consumption and illicit drug use.  Patient endorses tobacco use. ? ?Visit Diagnosis:  ?  ICD-10-CM   ?1. Insomnia due to other mental disorder  F51.05 traZODone (DESYREL) 50 MG tablet  ? F99   ?  ?2. Bipolar affective disorder, current episode mixed, current episode severity unspecified (HCC)  F31.60 haloperidol (HALDOL) 10 MG tablet  ?  benztropine (COGENTIN) 1 MG tablet  ?  ? ? ?Past Psychiatric History:  ?Bipolar Disorder ?Insomnia ? ?Past Medical History:  ?Past Medical History:  ?Diagnosis Date  ? Bipolar 1 disorder (HCC)   ? Hallucinations   ? Psychosis (HCC)   ?  ?Past Surgical History:  ?Procedure Laterality Date  ? APPLICATION OF A-CELL OF EXTREMITY Right 01/08/2017  ? Procedure: APPLICATION OF A-CELL RIGHT LOWER LEG;  Surgeon: Glenna Fellowshimmappa, Brinda, MD;  Location: [facility] OR;  Service: Plastics;  Laterality: Right;  ? APPLICATION OF WOUND VAC Right 01/01/2017  ? Procedure: RIGHT LEG WOUND VAC CHANGE with  irrigation and DEBRIDEMENT of right leg fasciotomy site;  Surgeon: Larina Earthly, MD;  Location: [facility] OR;  Service: Vascular;  Laterality: Right;  ? APPLICATION OF WOUND VAC Right 01/03/2017  ? Procedure: WOUND VAC CHANGE;  Surgeon: Sherren Kerns, MD;  Location: [facility] OR;  Service: Vascular;  Laterality: Right;  ?  FASCIOTOMY Right 12/31/2016  ? Procedure: FASCIOTOMY, RIGHT LOWER EXTREMITY COMPARTMENT;  Surgeon: Maeola Harman, MD;  Location: [facility] OR;  Service: Vascular;  Laterality: Right;  ? I & D EXTREMITY Right 01/03/2017  ? Procedure: RIGHT LOWER EXTREMITY FASCIOTOMY WASH-OUT. DEBRIDEMENT;  Surgeon: Sherren Kerns, MD;  Location: [facility] OR;  Service: Vascular;  Laterality: Right;  ? SKIN SPLIT GRAFT Right 01/08/2017  ? Procedure: SKIN GRAFT SPLIT THICKNESS from right thigh to right leg;  Surgeon: Glenna Fellows, MD;  Location: [facility] OR;  Service: Plastics;  Laterality: Right;  ? ? ?Family Psychiatric History:  ?No past family history of psychiatric illness ? ?Family History:  ?Family History  ?Problem Relation Age of Onset  ? Mental illness Neg Hx   ? ? ?Social History:  ?Social History  ? ?Socioeconomic History  ? Marital status: Single  ?  Spouse name: Not on file  ? Number of children: Not on file  ? Years of education: Not on file  ? Highest education level: Not on file  ?Occupational History  ? Not on file  ?Tobacco Use  ? Smoking status: Every Day  ?  Packs/day: 1.00  ?  Types: Cigarettes  ? Smokeless tobacco: Never  ?Vaping Use  ? Vaping Use: Never used  ?Substance and Sexual Activity  ? Alcohol use: No  ?  Comment: occas  ? Drug use: Yes  ?  Types: Benzodiazepines, Marijuana  ? Sexual activity: Yes  ?Other Topics Concern  ? Not on file  ?Social History Narrative  ? ** Merged History Encounter **  ?    ? ?Social Determinants of Health  ? ?Financial Resource Strain: Not on file  ?Food Insecurity: Not on file  ?Transportation Needs: Not on file  ?Physical Activity: Not on file  ?Stress: Not on file  ?Social Connections: Not on file  ? ? ?Allergies: No Known Allergies ? ?Metabolic Disorder Labs: ?Lab Results  ?Component Value Date  ? HGBA1C 5.6 04/28/2020  ? MPG 114 04/28/2020  ? MPG 114.02 10/08/2019  ? ?Lab Results  ?Component Value Date  ? PROLACTIN 50.6 (H) 10/08/2019  ? PROLACTIN 110.3 (H) 11/08/2015  ? ?Lab  Results  ?Component Value Date  ? CHOL 160 04/28/2020  ? TRIG 44 04/28/2020  ? HDL 50 04/28/2020  ? CHOLHDL 3.2 04/28/2020  ? VLDL 9 04/28/2020  ? LDLCALC 101 (H) 04/28/2020  ? LDLCALC 92 10/08/2019  ? ?Lab Results  ?Component Value Date  ? TSH 0.406 04/28/2020  ? TSH 0.850 10/08/2019  ? ? ?Therapeutic Level Labs: ?No results found for: LITHIUM ?No results found for: VALPROATE ?No components found for:  CBMZ ? ?Current Medications: ?Current Outpatient Medications  ?Medication Sig Dispense Refill  ? benztropine (COGENTIN) 1 MG tablet Take 1 tablet (1 mg total) by mouth 2 (two) times daily. 60 tablet 3  ? haloperidol (HALDOL) 10 MG tablet Take 1 tablet (10 mg total) by mouth 2 (two) times daily. 60 tablet 3  ? promethazine-dextromethorphan (PROMETHAZINE-DM) 6.25-15 MG/5ML syrup Take 5 mLs by mouth 4 (four) times daily as needed for cough. 140 mL 0  ? traZODone (DESYREL) 50 MG tablet Take 1 tablet (50 mg  total) by mouth at bedtime. 30 tablet 3  ? ?No current facility-administered medications for this visit.  ? ? ? ?Musculoskeletal: ?Strength & Muscle Tone: Unable to assess due to telemedicine visit ?Gait & Station: Unable to assess due to telemedicine visit ?Patient leans: Unable to assess due to telemedicine visit ? ?Psychiatric Specialty Exam: ?Review of Systems  ?Psychiatric/Behavioral:  Negative for decreased concentration, dysphoric mood, hallucinations, self-injury, sleep disturbance and suicidal ideas. The patient is not nervous/anxious and is not hyperactive.    ?There were no vitals taken for this visit.There is no height or weight on file to calculate BMI.  ?General Appearance: Unable to assess due to telemedicine visit  ?Eye Contact:  Unable to assess due to telemedicine visit  ?Speech:  Clear and Coherent and Normal Rate  ?Volume:  Normal  ?Mood:  Euthymic  ?Affect:  Appropriate  ?Thought Process:  Coherent, Goal Directed, and Descriptions of Associations: Intact  ?Orientation:  Full (Time, Place, and  Person)  ?Thought Content: WDL   ?Suicidal Thoughts:  No  ?Homicidal Thoughts:  No  ?Memory:  Immediate;   Good ?Recent;   Good ?Remote;   Good  ?Judgement:  Good  ?Insight:  Good  ?Psychomotor Activity:  Normal

## 2022-02-01 ENCOUNTER — Encounter (HOSPITAL_COMMUNITY): Payer: Self-pay

## 2022-02-01 ENCOUNTER — Telehealth (HOSPITAL_COMMUNITY): Payer: No Payment, Other | Admitting: Physician Assistant

## 2022-03-10 ENCOUNTER — Ambulatory Visit (HOSPITAL_COMMUNITY)
Admission: EM | Admit: 2022-03-10 | Discharge: 2022-03-10 | Disposition: A | Discharge status: Home / Self Care | Payer: Self-pay | Attending: Physician Assistant | Admitting: Physician Assistant

## 2022-03-10 ENCOUNTER — Encounter (HOSPITAL_COMMUNITY): Payer: Self-pay

## 2022-03-10 DIAGNOSIS — H209 Unspecified iridocyclitis: Secondary | ICD-10-CM

## 2022-03-10 DIAGNOSIS — H5711 Ocular pain, right eye: Secondary | ICD-10-CM

## 2022-03-10 MED ORDER — POLYMYXIN B-TRIMETHOPRIM 10000-0.1 UNIT/ML-% OP SOLN: 1.0000 [drp] | Freq: Four times a day (QID) | OPHTHALMIC | 0 refills | Status: AC
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
## 2022-03-10 NOTE — Discharge Instructions (Addendum)
Advised to call [facility] ophthalmology at [phone] as soon as possible in order to get an appointment to be seen today before the weekend to have the eye evaluated.  Address is [address] [place], [address]. Advised to wear sunglasses in order to protect the eyes from the sunlight. Advised to follow-up PCP or return to urgent care if symptoms fail to improve.
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
## 2022-05-23 ENCOUNTER — Telehealth (HOSPITAL_COMMUNITY): Payer: Self-pay

## 2022-05-23 ENCOUNTER — Other Ambulatory Visit (HOSPITAL_COMMUNITY): Payer: Self-pay | Admitting: Psychiatry

## 2022-05-23 DIAGNOSIS — F3160 Bipolar disorder, current episode mixed, unspecified: Secondary | ICD-10-CM

## 2022-05-23 MED ORDER — BENZTROPINE MESYLATE 1 MG PO TABS: 1.0000 mg | ORAL_TABLET | Freq: Two times a day (BID) | ORAL | 3 refills | Status: DC

## 2022-05-23 NOTE — Telephone Encounter (Signed)
Medication refilled and sent to preferred pharmacy

## 2022-11-05 ENCOUNTER — Other Ambulatory Visit (HOSPITAL_COMMUNITY): Payer: Self-pay | Admitting: Physician Assistant

## 2022-11-05 DIAGNOSIS — F3160 Bipolar disorder, current episode mixed, unspecified: Secondary | ICD-10-CM

## 2022-12-15 ENCOUNTER — Ambulatory Visit (INDEPENDENT_AMBULATORY_CARE_PROVIDER_SITE_OTHER): Payer: No Payment, Other | Admitting: Physician Assistant

## 2022-12-15 DIAGNOSIS — F3160 Bipolar disorder, current episode mixed, unspecified: Secondary | ICD-10-CM

## 2022-12-15 MED ORDER — HALOPERIDOL 10 MG PO TABS: 10.0000 mg | ORAL_TABLET | Freq: Two times a day (BID) | ORAL | 2 refills | Status: DC

## 2022-12-15 MED ORDER — BENZTROPINE MESYLATE 1 MG PO TABS: 1.0000 mg | ORAL_TABLET | Freq: Two times a day (BID) | ORAL | 2 refills | Status: DC

## 2022-12-15 NOTE — Progress Notes (Unsigned)
Psychiatric Initial Adult Assessment   Patient Identification: Todd Shaw MRN:  [id number] Date of Evaluation:  12/17/2022 Referral Source: Walk-in Chief Complaint:   Chief Complaint  Patient presents with   Other    Reestablish care   Medication Management   Visit Diagnosis:    ICD-10-CM   1. Bipolar affective disorder, current episode mixed, current episode severity unspecified (HCC)  F31.60 benztropine (COGENTIN) 1 MG tablet    haloperidol (HALDOL) 10 MG tablet      History of Present Illness:    Todd Shaw is a 37 year old, African-American male with a past psychiatric history significant for bipolar disorder who presents to [facility] to reestablish psychiatric care and for medication management.  Patient was last seen by this provider on 11/02/2021.  Patient presents today encounter requesting refills on his medication.  Patient was previously being prescribed the following psychiatric medications: Haldol 10 mg 2 times daily and benztropine 1 mg 2 times daily.  Patient reports that he last took his medications roughly a month ago and states that he was taking his medications on and off due to having a limited supply.  Patient denies having any issues with his medications and reports that they were helpful in managing his symptoms.  Patient denies depression and anxiety at this time.  Patient further denies psychotic symptoms.  Patient was previously hospitalized to the mental health back in 2020.  Patient denies past history of suicide attempts.  Patient endorses pretty good mood.  Patient denies suicidal or homicidal ideations.  He further denies auditory or visual hallucinations and does not appear to be responding to internal/external stimuli.  Patient denies paranoia or delusional thoughts.  Patient endorses good sleep.  Patient endorses good appetite and eats on average 2 meals per day.  Patient endorses alcohol consumption  sparingly.  Patient endorses tobacco use and smokes on average 15 cigarettes/day.  Patient denies illicit drug use.  Associated Signs/Symptoms: Depression Symptoms:   Patient denies (Hypo) Manic Symptoms:   Patient denies Anxiety Symptoms:   Patient denies Psychotic Symptoms:   Patient denies PTSD Symptoms: Negative  Past Psychiatric History:  Patient has a past psychiatric history significant for bipolar disorde  Patient has a past history of hospitalization due to mental health with his last hospitalization occurring in 2020  Patient denies a past history of suicide attempt  Patient denies a past history of homicide attempt  Previous Psychotropic Medications: Yes   Substance Abuse History in the last 12 months:  No.  Consequences of Substance Abuse: Negative  Past Medical History:  Past Medical History:  Diagnosis Date   Bipolar 1 disorder (HCC)    Hallucinations    Psychosis (HCC)     Past Surgical History:  Procedure Laterality Date   APPLICATION OF A-CELL OF EXTREMITY Right 01/08/2017   Procedure: APPLICATION OF A-CELL RIGHT LOWER LEG;  Surgeon: Glenna Fellows, MD;  [facility];  Service: Plastics;  Laterality: Right;   APPLICATION OF WOUND VAC Right 01/01/2017   Procedure: RIGHT LEG WOUND VAC CHANGE with irrigation and DEBRIDEMENT of right leg fasciotomy site;  Surgeon: Larina Earthly, MD;  Location: [facility] OR;  Service: Vascular;  Laterality: Right;   APPLICATION OF WOUND VAC Right 01/03/2017   Procedure: WOUND VAC CHANGE;  Surgeon: Sherren Kerns, MD;  Location: [facility] OR;  Service: Vascular;  Laterality: Right;   FASCIOTOMY Right 12/31/2016   Procedure: FASCIOTOMY, RIGHT LOWER EXTREMITY COMPARTMENT;  Surgeon: Maeola Harman, MD;  Location: [facility] OR;  Service: Vascular;  Laterality: Right;   I & D EXTREMITY Right 01/03/2017   Procedure: RIGHT LOWER EXTREMITY FASCIOTOMY WASH-OUT. DEBRIDEMENT;  Surgeon: Sherren Kerns, MD;  Location: [facility] OR;  Service: Vascular;   Laterality: Right;   SKIN SPLIT GRAFT Right 01/08/2017   Procedure: SKIN GRAFT SPLIT THICKNESS from right thigh to right leg;  Surgeon: Glenna Fellows, MD;  Location: [facility] OR;  Service: Plastics;  Laterality: Right;    Family Psychiatric History:  Patient denies a family history of psychiatric illness  Family history of suicide attempts: patient denies Family history of homicide attempts: patient denies Family history of substance abuse: patient denies  Family History:  Family History  Problem Relation Age of Onset   Mental illness Neg Hx     Social History:   Social History   Socioeconomic History   Marital status: Single    Spouse name: Not on file   Number of children: Not on file   Years of education: Not on file   Highest education level: Not on file  Occupational History   Not on file  Tobacco Use   Smoking status: Every Day    Packs/day: 1    Types: Cigarettes   Smokeless tobacco: Never  Vaping Use   Vaping Use: Never used  Substance and Sexual Activity   Alcohol use: No    Comment: occas   Drug use: Yes    Types: Benzodiazepines, Marijuana   Sexual activity: Yes  Other Topics Concern   Not on file  Social History Narrative   ** Merged History Encounter **       Social Determinants of Health   Financial Resource Strain: Not on file  Food Insecurity: Not on file  Transportation Needs: Not on file  Physical Activity: Not on file  Stress: Not on file  Social Connections: Not on file    Additional Social History:  Patient endorses social support. Patient denies having children of his own. Patient endorses housing. Patient is currently unemployed. Patient denies Research officer, trade union. Patient denies prison or jail time. 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 Patient denies access to weapons.  Allergies:  No Known Allergies  Metabolic Disorder Labs: Lab Results  Component Value Date   HGBA1C 5.6 04/28/2020   MPG 114  04/28/2020   MPG 114.02 10/08/2019   Lab Results  Component Value Date   PROLACTIN 50.6 (H) 10/08/2019   PROLACTIN 110.3 (H) 11/08/2015   Lab Results  Component Value Date   CHOL 160 04/28/2020   TRIG 44 04/28/2020   HDL 50 04/28/2020   CHOLHDL 3.2 04/28/2020   VLDL 9 04/28/2020   LDLCALC 101 (H) 04/28/2020   LDLCALC 92 10/08/2019   Lab Results  Component Value Date   TSH 0.406 04/28/2020    Therapeutic Level Labs: No results found for: "LITHIUM" No results found for: "CBMZ" No results found for: "VALPROATE"  Current Medications: Current Outpatient Medications  Medication Sig Dispense Refill   benztropine (COGENTIN) 1 MG tablet Take 1 tablet (1 mg total) by mouth 2 (two) times daily. 60 tablet 2   haloperidol (HALDOL) 10 MG tablet Take 1 tablet (10 mg total) by mouth 2 (two) times daily. 60 tablet 2   promethazine-dextromethorphan (PROMETHAZINE-DM) 6.25-15 MG/5ML syrup Take 5 mLs by mouth 4 (four) times daily as needed for cough. 140 mL 0   traZODone (DESYREL) 50 MG tablet Take 1 tablet (50 mg total) by mouth at bedtime. 30 tablet  3   trimethoprim-polymyxin b (POLYTRIM) ophthalmic solution Place 1 drop into the right eye every 6 (six) hours. 10 mL 0   No current facility-administered medications for this visit.    Musculoskeletal: Strength & Muscle Tone: within normal limits Gait & Station: normal Patient leans: N/A  Psychiatric Specialty Exam: Review of Systems  Psychiatric/Behavioral:  Negative for decreased concentration, dysphoric mood, hallucinations, self-injury, sleep disturbance and suicidal ideas. The patient is not nervous/anxious and is not hyperactive.     Blood pressure 120/80, pulse 92, height 5\' 8"  (1.727 m), SpO2 97 %.Body mass index is 29.8 kg/m.  General Appearance: Casual  Eye Contact:  Good  Speech:  Clear and Coherent and Normal Rate  Volume:  Normal  Mood:  Euthymic  Affect:  Appropriate  Thought Process:  Coherent, Goal Directed, and  Descriptions of Associations: Intact  Orientation:  Full (Time, Place, and Person)  Thought Content:  WDL  Suicidal Thoughts:  No  Homicidal Thoughts:  No  Memory:  Immediate;   Good Recent;   Good Remote;   Good  Judgement:  Good  Insight:  Good  Psychomotor Activity:  Normal  Concentration:  Concentration: Good and Attention Span: Good  Recall:  Good  Fund of Knowledge:Fair  Language: Good  Akathisia:  No  Handed:  Right  AIMS (if indicated):  not done  Assets:  Communication Skills Desire for Improvement Housing Vocational/Educational  ADL's:  Intact  Cognition: WNL  Sleep:  Good   Screenings: AIMS    Flowsheet Row [facility] Ad[facility] (Dis[facility] Admission (Discharged) from OP [facility]  AIMS Total Score 0 0 0 0      AUDIT    Flowsheet Row [facility]) [facility] Ad[facility] (Discharged[facility] Ad[facility] (Discharged[facility] Admission (Discharged) from OP [facility]  Alcohol Use Disorder Identification Test Final Score (AUDIT) 0 0 0 0      GAD-7    Flowsheet Row Clinical Support from 12/15/2022 in [facility] Office Visit from 11/02/2021 in [facility] Video Visit from 07/06/2021 in [facility] Office Visit from 12/02/2020 in [facility] Office Visit from 09/30/2020 in [facility]  Total GAD-7 Score 0 0 0 0 2       PHQ2-9    Flowsheet Row Clinical Support from 12/15/2022 in [facility] Office Visit from 11/02/2021 in [facility] Video Visit from 07/06/2021 in [facility] Office Visit from 12/02/2020 in [facility] Office Visit from 09/30/2020 in [facility]  PHQ-2 Total Score 0 0 0 0 0      Flowsheet Row Clinical Support from 12/15/2022 in [facility] ED from 03/10/2022 in [facility] Visit from 11/02/2021 in [facility]  C-SSRS RISK CATEGORY No Risk No Risk No Risk       Assessment and Plan:   Gerad Cornelio. B[place] is a 37 year old, African-American male with a past psychiatric history  significant for bipolar disorder who presents to [facility] to reestablish psychiatric care and for medication management.  Patient presents today encounter requesting refills on his medications.  Patient was previously being seen by this provider and was taking the following medications: Haldol 10 mg 2 times daily and benztropine 1 mg 2 times daily.  Patient reports that he last took his medications roughly a month ago.  Prior to running out of his medications, patient states that he was taking his medications on and off due to having a limited supply.  Patient to be placed back on his previous medication regimen.  Patient was agreeable to recommendation.  Patient's medications to be e-prescribed to pharmacy of choice.  Collaboration of Care: Medication Management AEB provider managing patient's psychiatric medications and Psychiatrist AEB patient being followed by a mental health provider at this facility  Patient/Guardian was advised Release of Information must be obtained prior to any record release in order to collaborate their care with an outside provider.  Patient/Guardian was advised if they have not already done so to contact the registration department to sign all necessary forms in order for [place] to release information regarding their care.   Consent: Patient/Guardian gives verbal consent for treatment and assignment of benefits for services provided during this visit. Patient/Guardian expressed understanding and agreed to proceed.   1. Bipolar affective disorder, current episode mixed, current episode severity unspecified (HCC)  - benztropine (COGENTIN) 1 MG tablet; Take 1 tablet (1 mg total) by mouth 2 (two) times daily.  Dispense: 60 tablet; Refill: 2 - haloperidol (HALDOL) 10 MG tablet; Take 1 tablet (10 mg total) by mouth 2 (two) times daily.  Dispense: 60 tablet; Refill: 2  Patient to follow up in 2 months Provider spent a total of 35 minutes with the patient/reviewing the patient's chart  Meta Hatchet, PA 6/9/20245:47 PM

## 2022-12-17 ENCOUNTER — Encounter (HOSPITAL_COMMUNITY): Payer: Self-pay | Admitting: Physician Assistant

## 2023-02-02 ENCOUNTER — Ambulatory Visit (HOSPITAL_COMMUNITY): Payer: No Payment, Other | Admitting: Physician Assistant

## 2023-02-02 DIAGNOSIS — F3160 Bipolar disorder, current episode mixed, unspecified: Secondary | ICD-10-CM | POA: Diagnosis not present

## 2023-02-02 MED ORDER — HALOPERIDOL 10 MG PO TABS: 10.0000 mg | ORAL_TABLET | Freq: Two times a day (BID) | ORAL | 2 refills | Status: DC

## 2023-02-02 MED ORDER — BENZTROPINE MESYLATE 1 MG PO TABS
1.0000 mg | ORAL_TABLET | Freq: Two times a day (BID) | ORAL | 2 refills | Status: AC
Start: 2023-02-02 — End: ?

## 2023-02-02 NOTE — Progress Notes (Unsigned)
BH MD/PA/NP OP Progress Note  02/05/2023 10:13 AM Mattox Buscaglia  MRN:  [id number]  Chief Complaint:  Chief Complaint  Patient presents with   Follow-up   Medication Refill   HPI:   Massi Shute. Mccammon is a 37 year old male with a past psychiatric history significant for bipolar affective disorder (current episode mixed) who presents to [facility] for follow-up and medication management.  Patient is currently being managed on the following psychiatric medications:  Haloperidol 10 mg 2 times daily Benztropine 1 mg 3 times daily  Patient reports that he will be starting a new job on Monday.  In regards to his medications, patient denies any problems with his medications at this time.  Patient denies experiencing any adverse side effects from his use of medications and further denies the need for dosage adjustments at this time.  Patient denies experiencing depression or anxiety.  Patient denies any new stressors at this time.  Patient is alert and oriented x 4, calm, cooperative, and fully engaged in conversation during the encounter.  Patient endorses good mood.  Patient denies suicidal or homicidal ideations.  He further denies auditory or visual hallucinations and does not appear to be responding to internal/external stimuli.  Patient endorses good sleep and receives on average 8 hours of sleep per night.  Patient endorses good appetite and eats on average 2-3 meals per day.  Patient endorses alcohol consumption sparingly.  Patient endorses tobacco use and smokes on average 15 cigarettes/day.  Patient denies illicit drug use.  Visit Diagnosis:    ICD-10-CM   1. Bipolar affective disorder, current episode mixed, current episode severity unspecified (HCC)  F31.60 haloperidol (HALDOL) 10 MG tablet    benztropine (COGENTIN) 1 MG tablet      Past Psychiatric History:  Bipolar Disorder Insomnia  Past Medical History:  Past Medical History:   Diagnosis Date   Bipolar 1 disorder (HCC)    Hallucinations    Psychosis (HCC)     Past Surgical History:  Procedure Laterality Date   APPLICATION OF A-CELL OF EXTREMITY Right 01/08/2017   Procedure: APPLICATION OF A-CELL RIGHT LOWER LEG;  Surgeon: Glenna Fellows, MD;  [facility];  Service: Plastics;  Laterality: Right;   APPLICATION OF WOUND VAC Right 01/01/2017   Procedure: RIGHT LEG WOUND VAC CHANGE with irrigation and DEBRIDEMENT of right leg fasciotomy site;  Surgeon: Larina Earthly, MD;  Location: [facility] OR;  Service: Vascular;  Laterality: Right;   APPLICATION OF WOUND VAC Right 01/03/2017   Procedure: WOUND VAC CHANGE;  Surgeon: Sherren Kerns, MD;  Location: [facility] OR;  Service: Vascular;  Laterality: Right;   FASCIOTOMY Right 12/31/2016   Procedure: FASCIOTOMY, RIGHT LOWER EXTREMITY COMPARTMENT;  Surgeon: Maeola Harman, MD;  Location: [facility] OR;  Service: Vascular;  Laterality: Right;   I & D EXTREMITY Right 01/03/2017   Procedure: RIGHT LOWER EXTREMITY FASCIOTOMY WASH-OUT. DEBRIDEMENT;  Surgeon: Sherren Kerns, MD;  Location: [facility] OR;  Service: Vascular;  Laterality: Right;   SKIN SPLIT GRAFT Right 01/08/2017   Procedure: SKIN GRAFT SPLIT THICKNESS from right thigh to right leg;  Surgeon: Glenna Fellows, MD;  [facility]: [facility] OR;  Service: Plastics;  Laterality: Right;    Family Psychiatric History:  No past family history of psychiatric illness   Family History:  Family History  Problem Relation Age of Onset   Mental illness Neg Hx     Social History:  Social History   Socioeconomic History   Marital  status: Single    Spouse name: Not on file   Number of children: Not on file   Years of education: Not on file   Highest education level: Not on file  Occupational History   Not on file  Tobacco Use   Smoking status: Every Day    Current packs/day: 1.00    Types: Cigarettes   Smokeless tobacco: Never  Vaping Use   Vaping status: Never Used  Substance and  Sexual Activity   Alcohol use: No    Comment: occas   Drug use: Yes    Types: Benzodiazepines, Marijuana   Sexual activity: Yes  Other Topics Concern   Not on file  Social History Narrative   ** Merged History Encounter **       Social Determinants of Health   Financial Resource Strain: Not on file  Food Insecurity: Not on file  Transportation Needs: Not on file  Physical Activity: Not on file  Stress: Not on file  Social Connections: Not on file    Allergies: No Known Allergies  Metabolic Disorder Labs: Lab Results  Component Value Date   HGBA1C 5.6 04/28/2020   MPG 114 04/28/2020   MPG 114.02 10/08/2019   Lab Results  Component Value Date   PROLACTIN 50.6 (H) 10/08/2019   PROLACTIN 110.3 (H) 11/08/2015   Lab Results  Component Value Date   CHOL 160 04/28/2020   TRIG 44 04/28/2020   HDL 50 04/28/2020   CHOLHDL 3.2 04/28/2020   VLDL 9 04/28/2020   LDLCALC 101 (H) 04/28/2020   LDLCALC 92 10/08/2019   Lab Results  Component Value Date   TSH 0.406 04/28/2020   TSH 0.850 10/08/2019    Therapeutic Level Labs: No results found for: "LITHIUM" No results found for: "VALPROATE" No results found for: "CBMZ"  Current Medications: Current Outpatient Medications  Medication Sig Dispense Refill   benztropine (COGENTIN) 1 MG tablet Take 1 tablet (1 mg total) by mouth 2 (two) times daily. 60 tablet 2   haloperidol (HALDOL) 10 MG tablet Take 1 tablet (10 mg total) by mouth 2 (two) times daily. 60 tablet 2   promethazine-dextromethorphan (PROMETHAZINE-DM) 6.25-15 MG/5ML syrup Take 5 mLs by mouth 4 (four) times daily as needed for cough. 140 mL 0   traZODone (DESYREL) 50 MG tablet Take 1 tablet (50 mg total) by mouth at bedtime. 30 tablet 3   trimethoprim-polymyxin b (POLYTRIM) ophthalmic solution Place 1 drop into the right eye every 6 (six) hours. 10 mL 0   No current facility-administered medications for this visit.     Musculoskeletal: Strength & Muscle Tone:  within normal limits Gait & Station: normal Patient leans: N/A  Psychiatric Specialty Exam: Review of Systems  Psychiatric/Behavioral:  Negative for decreased concentration, dysphoric mood, hallucinations, self-injury, sleep disturbance and suicidal ideas. The patient is not nervous/anxious and is not hyperactive.     Blood pressure 115/84, pulse 91, temperature 97.8 F (36.6 C), temperature source Oral, height 5\' 8"  (1.727 m), weight 205 lb (93 kg), SpO2 98%.Body mass index is 31.17 kg/m.  General Appearance: Casual  Eye Contact:  Good  Speech:  Clear and Coherent and Normal Rate  Volume:  Normal  Mood:  Euthymic  Affect:  Appropriate  Thought Process:  Coherent, Goal Directed, and Descriptions of Associations: Intact  Orientation:  Full (Time, Place, and Person)  Thought Content: WDL   Suicidal Thoughts:  No  Homicidal Thoughts:  No  Memory:  Immediate;   Good Recent;   Good  Remote;   Good  Judgement:  Good  Insight:  Good  Psychomotor Activity:  Normal  Concentration:  Concentration: Good and Attention Span: Good  Recall:  Good  Fund of Knowledge: Fair  Language: Good  Akathisia:  No  Handed:  Right  AIMS (if indicated): not done  Assets:  Communication Skills Desire for Improvement Housing Vocational/Educational  ADL's:  Intact  Cognition: WNL  Sleep:  Good   Screenings: AIMS    Flowsheet Row [facility] Ad[facility] (Dis[facility] Ad[facility] (Dis[facility] Admission (Discharged) from OP [facility]  AIMS Total Score 0 0 0 0      AUDIT    Flowsheet Row [facility]) [facility]) [facility] Ad[facility] (Discharged[facility] Admission (Discharged) from OP [facility]  Alcohol Use Disorder Identification Test Final Score (AUDIT) 0 0 0 0      GAD-7    Flowsheet Row Clinical Support from 02/02/2023 in [facility] Clinical Support from 12/15/2022 in [facility] Office Visit from 11/02/2021 in [facility] Video Visit from 07/06/2021 in [facility] Office Visit from 12/02/2020 in [facility]  Total GAD-7 Score 0 0 0 0 0      PHQ2-9    Flowsheet Row Clinical Support from 02/02/2023 in [facility] Clinical Support from 12/15/2022 in [facility] Office Visit from 11/02/2021 in [facility] Video Visit from 07/06/2021 in [facility] Office Visit from 12/02/2020 in [facility]  PHQ-2 Total Score 0 0 0 0 0      Flowsheet Row Clinical Support from 02/02/2023 in [facility] Clinical Support from 12/15/2022 in [facility] ED from 03/10/2022 in [facility] RISK CATEGORY No Risk No Risk No Risk        Assessment and Plan:   Hy Schuff. Hariri is a 37 year old male with a past psychiatric history significant for bipolar affective disorder (current episode mixed) who presents to [facility] for follow-up and medication management.  Patient presents to the encounter reporting no issues or concerns regarding his current medication regimen.  Patient denies experiencing any adverse side effects and further denies the need for dosage adjustments at this time.  Patient denies depression  nor does he endorse anxiety.  Patient endorses stability on his current medication regimen.  Patient's medications to be e-prescribed to pharmacy of choice.  Collaboration of Care: Collaboration of Care: Medication Management AEB provider managing patient's psychiatric medications and Psychiatrist AEB patient being followed by a mental health provider  Patient/Guardian was advised Release of Information must be obtained prior to any record release in order to collaborate their care with an outside provider. Patient/Guardian was advised if they have not already done so to contact the registration department to sign all necessary forms in order for [place] to release information regarding their care.   Consent: Patient/Guardian gives verbal consent for treatment and assignment of benefits  for services provided during this visit. Patient/Guardian expressed understanding and agreed to proceed.   1. Bipolar affective disorder, current episode mixed, current episode severity unspecified (HCC)  - haloperidol (HALDOL) 10 MG tablet; Take 1 tablet (10 mg total) by mouth 2 (two) times daily.  Dispense: 60 tablet; Refill: 2 - benztropine (COGENTIN) 1 MG tablet; Take 1 tablet (1 mg total) by mouth 2 (two) times daily.  Dispense: 60 tablet; Refill: 2  Patient to follow up in 2 months Provider spent a total of 10 minutes with the patient/reviewing the patient's chart  Meta Hatchet, PA 02/05/2023, 10:13 AM

## 2023-02-05 ENCOUNTER — Encounter (HOSPITAL_COMMUNITY): Payer: Self-pay | Admitting: Physician Assistant

## 2023-04-06 ENCOUNTER — Telehealth (INDEPENDENT_AMBULATORY_CARE_PROVIDER_SITE_OTHER): Payer: No Payment, Other | Admitting: Physician Assistant

## 2023-04-06 ENCOUNTER — Encounter (HOSPITAL_COMMUNITY): Payer: Self-pay | Admitting: Physician Assistant

## 2023-04-06 DIAGNOSIS — F3160 Bipolar disorder, current episode mixed, unspecified: Secondary | ICD-10-CM

## 2023-04-06 MED ORDER — BENZTROPINE MESYLATE 1 MG PO TABS
1.0000 mg | ORAL_TABLET | Freq: Two times a day (BID) | ORAL | 2 refills | Status: DC
Start: 2023-04-06 — End: 2023-07-06

## 2023-04-06 MED ORDER — HALOPERIDOL 10 MG PO TABS
10.0000 mg | ORAL_TABLET | Freq: Two times a day (BID) | ORAL | 2 refills | Status: DC
Start: 2023-04-06 — End: 2023-07-06

## 2023-04-06 NOTE — Progress Notes (Unsigned)
BH MD/PA/NP OP Progress Note  Virtual Visit via Video Note  I connected with Todd Shaw on 04/06/23 at  1:30 PM EDT by a video enabled telemedicine application and verified that I am speaking with the correct person using two identifiers.  Location: Patient: Home Provider: [facility]   I discussed the limitations of evaluation and management by telemedicine and the availability of in person appointments. The patient expressed understanding and agreed to proceed.  Follow Up Instructions:  I discussed the assessment and treatment plan with the patient. The patient was provided an opportunity to ask questions and all were answered. The patient agreed with the plan and demonstrated an understanding of the instructions.   The patient was advised to call back or seek an in-person evaluation if the symptoms worsen or if the condition fails to improve as anticipated.  I provided 6 minutes of non-face-to-face time during this encounter.  Meta Hatchet, PA    04/06/2023 5:36 PM Todd Shaw  MRN:  [id number]  Chief Complaint:  Chief Complaint  Patient presents with   Follow-up   Medication Refill   HPI:   Todd Shaw. Tucholski is a 37 year old male with a past psychiatric history significant for bipolar affective disorder (current episode mixed) who presents to [facility] for follow-up and medication management.  Patient is currently being managed on the following psychiatric medications:  Haloperidol 10 mg 2 times daily Benztropine 1 mg 2 times daily  Patient presents today encounter reporting no issues or concerns regarding his current medication regimen.  Patient denies experiencing any adverse side effects and denies the need for dosage adjustments at this time.  Patient denies depressive symptoms at this time nor does he endorse anxiety.  Patient further denies any stressors at this time.  Patient is alert and oriented x 4, calm,  cooperative, and fully engaged in conversation during the encounter.  Patient endorses great mood.  Patient denies suicidal or homicidal ideations.  He further denies auditory or visual hallucinations and does not appear to be responding to internal/external stimuli.  Patient endorses good sleep and receives on average 8 hours of sleep per night.  Patient endorses good appetite and eats on average 2 meals per day.  Patient denies alcohol consumption or illicit drug use.  Patient endorses tobacco use and smokes on average a pack per day.  Visit Diagnosis:    ICD-10-CM   1. Bipolar affective disorder, current episode mixed, current episode severity unspecified (HCC)  F31.60 haloperidol (HALDOL) 10 MG tablet    benztropine (COGENTIN) 1 MG tablet      Past Psychiatric History:  Bipolar Disorder Insomnia  Past Medical History:  Past Medical History:  Diagnosis Date   Bipolar 1 disorder (HCC)    Hallucinations    Psychosis (HCC)     Past Surgical History:  Procedure Laterality Date   APPLICATION OF A-CELL OF EXTREMITY Right 01/08/2017   Procedure: APPLICATION OF A-CELL RIGHT LOWER LEG;  Surgeon: Glenna Fellows, MD;  Location: [facility] OR;  Service: Plastics;  Laterality: Right;   APPLICATION OF WOUND VAC Right 01/01/2017   Procedure: RIGHT LEG WOUND VAC CHANGE with irrigation and DEBRIDEMENT of right leg fasciotomy site;  Surgeon: Larina Earthly, MD;  Location: [facility] OR;  Service: Vascular;  Laterality: Right;   APPLICATION OF WOUND VAC Right 01/03/2017   Procedure: WOUND VAC CHANGE;  Surgeon: Sherren Kerns, MD;  Location[facility] OR;  Service: Vascular;  Laterality: Right;   FASCIOTOMY  Right 12/31/2016   Procedure: FASCIOTOMY, RIGHT LOWER EXTREMITY COMPARTMENT;  Surgeon: Maeola Harman, MD;  Location: [facility] OR;  Service: Vascular;  Laterality: Right;   I & D EXTREMITY Right 01/03/2017   Procedure: RIGHT LOWER EXTREMITY FASCIOTOMY WASH-OUT. DEBRIDEMENT;  Surgeon: Sherren Kerns, MD;  Location:  [facility] OR;  Service: Vascular;  Laterality: Right;   SKIN SPLIT GRAFT Right 01/08/2017   Procedure: SKIN GRAFT SPLIT THICKNESS from right thigh to right leg;  Surgeon: Glenna Fellows, MD;  Location: [facility] OR;  Service: Plastics;  Laterality: Right;    Family Psychiatric History:  No past family history of psychiatric illness   Family History:  Family History  Problem Relation Age of Onset   Mental illness Neg Hx     Social History:  Social History   Socioeconomic History   Marital status: Single    Spouse name: Not on file   Number of children: Not on file   Years of education: Not on file   Highest education level: Not on file  Occupational History   Not on file  Tobacco Use   Smoking status: Every Day    Current packs/day: 1.00    Types: Cigarettes   Smokeless tobacco: Never  Vaping Use   Vaping status: Never Used  Substance and Sexual Activity   Alcohol use: No    Comment: occas   Drug use: Yes    Types: Benzodiazepines, Marijuana   Sexual activity: Yes  Other Topics Concern   Not on file  Social History Narrative   ** Merged History Encounter **       Social Determinants of Health   Financial Resource Strain: Not on file  Food Insecurity: Not on file  Transportation Needs: Not on file  Physical Activity: Not on file  Stress: Not on file  Social Connections: Not on file    Allergies: No Known Allergies  Metabolic Disorder Labs: Lab Results  Component Value Date   HGBA1C 5.6 04/28/2020   MPG 114 04/28/2020   MPG 114.02 10/08/2019   Lab Results  Component Value Date   PROLACTIN 50.6 (H) 10/08/2019   PROLACTIN 110.3 (H) 11/08/2015   Lab Results  Component Value Date   CHOL 160 04/28/2020   TRIG 44 04/28/2020   HDL 50 04/28/2020   CHOLHDL 3.2 04/28/2020   VLDL 9 04/28/2020   LDLCALC 101 (H) 04/28/2020   LDLCALC 92 10/08/2019   Lab Results  Component Value Date   TSH 0.406 04/28/2020   TSH 0.850 10/08/2019    Therapeutic Level Labs: No  results found for: "LITHIUM" No results found for: "VALPROATE" No results found for: "CBMZ"  Current Medications: Current Outpatient Medications  Medication Sig Dispense Refill   benztropine (COGENTIN) 1 MG tablet Take 1 tablet (1 mg total) by mouth 2 (two) times daily. 60 tablet 2   haloperidol (HALDOL) 10 MG tablet Take 1 tablet (10 mg total) by mouth 2 (two) times daily. 60 tablet 2   promethazine-dextromethorphan (PROMETHAZINE-DM) 6.25-15 MG/5ML syrup Take 5 mLs by mouth 4 (four) times daily as needed for cough. 140 mL 0   traZODone (DESYREL) 50 MG tablet Take 1 tablet (50 mg total) by mouth at bedtime. 30 tablet 3   trimethoprim-polymyxin b (POLYTRIM) ophthalmic solution Place 1 drop into the right eye every 6 (six) hours. 10 mL 0   No current facility-administered medications for this visit.     Musculoskeletal: Strength & Muscle Tone: within normal limits Gait & Station: normal Patient leans:  N/A  Psychiatric Specialty Exam: Review of Systems  Psychiatric/Behavioral:  Negative for decreased concentration, dysphoric mood, hallucinations, self-injury, sleep disturbance and suicidal ideas. The patient is not nervous/anxious and is not hyperactive.     There were no vitals taken for this visit.There is no height or weight on file to calculate BMI.  General Appearance: Casual  Eye Contact:  Good  Speech:  Clear and Coherent and Normal Rate  Volume:  Normal  Mood:  Euthymic  Affect:  Appropriate  Thought Process:  Coherent, Goal Directed, and Descriptions of Associations: Intact  Orientation:  Full (Time, Place, and Person)  Thought Content: WDL   Suicidal Thoughts:  No  Homicidal Thoughts:  No  Memory:  Immediate;   Good Recent;   Good Remote;   Good  Judgement:  Good  Insight:  Good  Psychomotor Activity:  Normal  Concentration:  Concentration: Good and Attention Span: Good  Recall:  Good  Fund of Knowledge: Fair  Language: Good  Akathisia:  No  Handed:  Right   AIMS (if indicated): not done  Assets:  Communication Skills Desire for Improvement Housing Vocational/Educational  ADL's:  Intact  Cognition: WNL  Sleep:  Good   Screenings: AIMS    Flowsheet Row [facility] Ad[facility] (Discharged[facility] Ad[facility] (Discharged) [facility] Admission (Discharged) from OP [facility]  AIMS Total Score 0 0 0 0      AUDIT    Flowsheet Row Ad[facility]) [facility] Admission (Discharged) [facility] Admission (Discharged) from [facility] Admission (Discharged) from OP Visit [facility]  Alcohol Use Disorder Identification Test Final Score (AUDIT) 0 0 0 0      GAD-7    Flowsheet Row Video Visit from 04/06/2023 in [facility] Clinical Support from 02/02/2023 in [facility] Clinical Support from 12/15/2022 in [facility] Office Visit from 11/02/2021 in [facility] Video Visit from 07/06/2021 in [facility]  Total GAD-7 Score 0 0 0 0 0      PHQ2-9    Flowsheet Row Video Visit from 04/06/2023 in [facility] Clinical Support from 02/02/2023 in [facility] Clinical Support from 12/15/2022 in [facility] Office Visit from 11/02/2021 in [facility] Video Visit from 07/06/2021 in [facility]  PHQ-2 Total Score 0 0 0 0 0      Flowsheet Row  Video Visit from 04/06/2023 in [facility] Clinical Support from 02/02/2023 in [facility] Clinical Support from 12/15/2022 in [facility]  C-SSRS RISK CATEGORY No Risk No Risk No Risk        Assessment and Plan:   Henok Heacock. Micke is a 37 year old male with a past psychiatric history significant for bipolar affective disorder (current episode mixed) who presents to [facility] for follow-up and medication management.  Patient reports no issues or concerns regarding his current medication regimen.  Patient denies the need for dosage adjustments at this time.  Patient denies  depressive symptoms nor does he endorse anxiety.  Patient endorses stability on his current medication regimen and would like to continue taking his medications as prescribed.  Patient's medications to be e-prescribed to pharmacy of choice.  Collaboration of Care: Collaboration of Care: Medication Management AEB provider managing patient's psychiatric medications and Psychiatrist AEB patient being followed by a mental health provider  Patient/Guardian was advised Release of Information must be obtained prior to any record release in order to collaborate their care with an outside provider. Patient/Guardian was advised if they have not already done so to contact the registration department to sign all necessary forms in order for [place] to release information regarding their care.   Consent: Patient/Guardian gives verbal consent for treatment and assignment of benefits for services provided during this visit. Patient/Guardian expressed understanding and agreed to proceed.   1. Bipolar affective disorder, current episode mixed, current episode severity unspecified (HCC)  - haloperidol (HALDOL) 10 MG tablet; Take 1 tablet (10 mg total) by mouth 2 (two) times daily.  Dispense: 60 tablet; Refill: 2 - benztropine  (COGENTIN) 1 MG tablet; Take 1 tablet (1 mg total) by mouth 2 (two) times daily.  Dispense: 60 tablet; Refill: 2  Patient to follow up in 2 months Provider spent a total of 6 minutes with the patient/reviewing the patient's chart  Meta Hatchet, PA 04/06/2023, 5:36 PM

## 2023-07-06 ENCOUNTER — Telehealth (INDEPENDENT_AMBULATORY_CARE_PROVIDER_SITE_OTHER): Payer: No Payment, Other | Admitting: Physician Assistant

## 2023-07-06 ENCOUNTER — Encounter (HOSPITAL_COMMUNITY): Payer: Self-pay | Admitting: Physician Assistant

## 2023-07-06 DIAGNOSIS — F3160 Bipolar disorder, current episode mixed, unspecified: Secondary | ICD-10-CM

## 2023-07-06 MED ORDER — BENZTROPINE MESYLATE 1 MG PO TABS
1.0000 mg | ORAL_TABLET | Freq: Two times a day (BID) | ORAL | 3 refills | Status: DC
Start: 2023-07-06 — End: 2023-10-05

## 2023-07-06 MED ORDER — HALOPERIDOL 10 MG PO TABS
10.0000 mg | ORAL_TABLET | Freq: Two times a day (BID) | ORAL | 3 refills | Status: DC
Start: 2023-07-06 — End: 2023-10-05

## 2023-07-06 NOTE — Progress Notes (Unsigned)
BH MD/PA/NP OP Progress Note  Virtual Visit via Video Note  I connected with Todd Shaw on 07/06/23 at  1:30 PM EST by a video enabled telemedicine application and verified that I am speaking with the correct person using two identifiers.  Location: Patient: Home Provider: [facility]   I discussed the limitations of evaluation and management by telemedicine and the availability of in person appointments. The patient expressed understanding and agreed to proceed.  Follow Up Instructions:  I discussed the assessment and treatment plan with the patient. The patient was provided an opportunity to ask questions and all were answered. The patient agreed with the plan and demonstrated an understanding of the instructions.   The patient was advised to call back or seek an in-person evaluation if the symptoms worsen or if the condition fails to improve as anticipated.  I provided 7 minutes of non-face-to-face time during this encounter.  Meta Hatchet, PA    07/06/2023 6:05 PM Todd Shaw  MRN:  [id number]  Chief Complaint:  Chief Complaint  Patient presents with   Follow-up   Medication Refill   HPI:   Todd Shaw is a 37 year old male with a past psychiatric history significant for bipolar affective disorder (current episode mixed) who presents to [facility] for follow-up and medication management.  Patient is currently being managed on the following psychiatric medications:  Haloperidol 10 mg 2 times daily Benztropine 1 mg 2 times daily  Patient presents to the encounter reporting no issues or concerns regarding her current medication regimen.  Patient denies experiencing any adverse side effects with his current medication regimen and denies the need for dosage adjustments at this time.  Patient denies depressive symptoms nor does he endorse anxiety at this time.  Patient endorses stability on his current medication  regimen.  Patient is alert and oriented x 4, calm, cooperative, and fully engaged in conversation during the encounter.  Patient endorses good mood.  Patient denies suicidal or homicidal ideations.  He further denies auditory or visual hallucinations and does not appear to be responding to internal/external stimuli.  Patient endorses good sleep.  Patient endorses good appetite and eats on average 2 meals per day.  Patient denies alcohol consumption or illicit drug use.  Patient endorses tobacco use and smokes on average 15 cigarettes/day.  Visit Diagnosis:    ICD-10-CM   1. Bipolar affective disorder, current episode mixed, current episode severity unspecified (HCC)  F31.60 haloperidol (HALDOL) 10 MG tablet    benztropine (COGENTIN) 1 MG tablet      Past Psychiatric History:  Bipolar Disorder Insomnia  Past Medical History:  Past Medical History:  Diagnosis Date   Bipolar 1 disorder (HCC)    Hallucinations    Psychosis (HCC)     Past Surgical History:  Procedure Laterality Date   APPLICATION OF A-CELL OF EXTREMITY Right 01/08/2017   Procedure: APPLICATION OF A-CELL RIGHT LOWER LEG;  Surgeon: Glenna Fellows, MD;  Location: [facility] OR;  Service: Plastics;  Laterality: Right;   APPLICATION OF WOUND VAC Right 01/01/2017   Procedure: RIGHT LEG WOUND VAC CHANGE with irrigation and DEBRIDEMENT of right leg fasciotomy site;  Surgeon: Larina Earthly, MD;  Location: [facility] OR;  Service: Vascular;  Laterality: Right;   APPLICATION OF WOUND VAC Right 01/03/2017   Procedure: WOUND VAC CHANGE;  Surgeon: Sherren Kerns, MD;  Location: [facility] OR;  Service: Vascular;  Laterality: Right;   FASCIOTOMY Right 12/31/2016   Procedure: FASCIOTOMY,  RIGHT LOWER EXTREMITY COMPARTMENT;  Surgeon: Maeola Harman, MD;  Location: [facility] OR;  Service: Vascular;  Laterality: Right;   I & D EXTREMITY Right 01/03/2017   Procedure: RIGHT LOWER EXTREMITY FASCIOTOMY WASH-OUT. DEBRIDEMENT;  Surgeon: Sherren Kerns, MD;   Location: [facility] OR;  Service: Vascular;  Laterality: Right;   SKIN SPLIT GRAFT Right 01/08/2017   Procedure: SKIN GRAFT SPLIT THICKNESS from right thigh to right leg;  Surgeon: Glenna Fellows, MD;  Location: [facility] OR;  Service: Plastics;  Laterality: Right;    Family Psychiatric History:  No past family history of psychiatric illness   Family History:  Family History  Problem Relation Age of Onset   Mental illness Neg Hx     Social History:  Social History   Socioeconomic History   Marital status: Single    Spouse name: Not on file   Number of children: Not on file   Years of education: Not on file   Highest education level: Not on file  Occupational History   Not on file  Tobacco Use   Smoking status: Every Day    Current packs/day: 1.00    Types: Cigarettes   Smokeless tobacco: Never  Vaping Use   Vaping status: Never Used  Substance and Sexual Activity   Alcohol use: No    Comment: occas   Drug use: Yes    Types: Benzodiazepines, Marijuana   Sexual activity: Yes  Other Topics Concern   Not on file  Social History Narrative   ** Merged History Encounter **       Social Drivers of Health   Financial Resource Strain: Not on file  Food Insecurity: Not on file  Transportation Needs: Not on file  Physical Activity: Not on file  Stress: Not on file  Social Connections: Not on file    Allergies: No Known Allergies  Metabolic Disorder Labs: Lab Results  Component Value Date   HGBA1C 5.6 04/28/2020   MPG 114 04/28/2020   MPG 114.02 10/08/2019   Lab Results  Component Value Date   PROLACTIN 50.6 (H) 10/08/2019   PROLACTIN 110.3 (H) 11/08/2015   Lab Results  Component Value Date   CHOL 160 04/28/2020   TRIG 44 04/28/2020   HDL 50 04/28/2020   CHOLHDL 3.2 04/28/2020   VLDL 9 04/28/2020   LDLCALC 101 (H) 04/28/2020   LDLCALC 92 10/08/2019   Lab Results  Component Value Date   TSH 0.406 04/28/2020   TSH 0.850 10/08/2019    Therapeutic Level Labs: No  results found for: "LITHIUM" No results found for: "VALPROATE" No results found for: "CBMZ"  Current Medications: Current Outpatient Medications  Medication Sig Dispense Refill   benztropine (COGENTIN) 1 MG tablet Take 1 tablet (1 mg total) by mouth 2 (two) times daily. 60 tablet 3   haloperidol (HALDOL) 10 MG tablet Take 1 tablet (10 mg total) by mouth 2 (two) times daily. 60 tablet 3   promethazine-dextromethorphan (PROMETHAZINE-DM) 6.25-15 MG/5ML syrup Take 5 mLs by mouth 4 (four) times daily as needed for cough. 140 mL 0   traZODone (DESYREL) 50 MG tablet Take 1 tablet (50 mg total) by mouth at bedtime. 30 tablet 3   trimethoprim-polymyxin b (POLYTRIM) ophthalmic solution Place 1 drop into the right eye every 6 (six) hours. 10 mL 0   No current facility-administered medications for this visit.     Musculoskeletal: Strength & Muscle Tone: within normal limits Gait & Station: normal Patient leans: N/A  Psychiatric Specialty Exam: Review  of Systems  Psychiatric/Behavioral:  Negative for decreased concentration, dysphoric mood, hallucinations, self-injury, sleep disturbance and suicidal ideas. The patient is not nervous/anxious and is not hyperactive.     There were no vitals taken for this visit.There is no height or weight on file to calculate BMI.  General Appearance: Casual  Eye Contact:  Good  Speech:  Clear and Coherent and Normal Rate  Volume:  Normal  Mood:  Euthymic  Affect:  Appropriate  Thought Process:  Coherent, Goal Directed, and Descriptions of Associations: Intact  Orientation:  Full (Time, Place, and Person)  Thought Content: WDL   Suicidal Thoughts:  No  Homicidal Thoughts:  No  Memory:  Immediate;   Good Recent;   Good Remote;   Good  Judgement:  Good  Insight:  Good  Psychomotor Activity:  Normal  Concentration:  Concentration: Good and Attention Span: Good  Recall:  Good  Fund of Knowledge: Fair  Language: Good  Akathisia:  No  Handed:  Right   AIMS (if indicated): not done  Assets:  Communication Skills Desire for Improvement Housing Vocational/Educational  ADL's:  Intact  Cognition: WNL  Sleep:  Good   Screenings: AIMS    Flowsheet Row [facility] ([facility] Ad[facility] (Discharged) from OP [facility]  AIMS Total Score 0 0 0 0      AUDIT    Flowsheet Row [facility]) [facility] (Dis[facility] (Dis[facility] Admission (Discharged) from OP [facility]  Alcohol Use Disorder Identification Test Final Score (AUDIT) 0 0 0 0      GAD-7    Flowsheet Row Video Visit from 07/06/2023 in [facility] Video Visit from 04/06/2023 in [facility] Clinical Support from 02/02/2023 in [facility] Clinical Support from 12/15/2022 in [facility] Office Visit from 11/02/2021 in [facility]  Total GAD-7 Score 0 0 0 0 0      PHQ2-9    Flowsheet Row Video Visit from 07/06/2023 in [facility] Video Visit from 04/06/2023 in [facility] Clinical Support from 02/02/2023 in [facility] Clinical Support from 12/15/2022 in [facility] Office Visit from 11/02/2021 in [place]  PHQ-2 Total Score 0 0 0 0 0      Flowsheet Row  Video Visit from 07/06/2023 in [facility] Video Visit from 04/06/2023 in [facility] Clinical Support from 02/02/2023 in [facility]  C-SSRS RISK CATEGORY No Risk No Risk No Risk        Assessment and Plan:   Todd Shaw is a 37 year old male with a past psychiatric history significant for bipolar affective disorder (current episode mixed) who presents to [facility] for follow-up and medication management.  Patient presents to the encounter reporting no issues or concerns regarding his current medication regimen.  Patient endorses stability on his current use of medications and denies the need for dosage  adjustments at this time.  Patient to continue taking his medications as prescribed.  Patient's medications to be e-prescribed to pharmacy of choice.  Collaboration of Care: Collaboration of Care: Medication Management AEB provider managing patient's psychiatric medications and Psychiatrist AEB patient being followed by a mental health provider  Patient/Guardian was advised Release of Information must be obtained prior to any record release in order to collaborate their care with an outside provider. Patient/Guardian was advised if they have not already done so to contact the registration department to sign all necessary forms in order for [place] to release information regarding their care.   Consent: Patient/Guardian gives verbal consent for treatment and assignment of benefits for services provided during this visit. Patient/Guardian expressed understanding and agreed to proceed.   1. Bipolar affective disorder, current episode mixed, current episode severity unspecified (HCC)  - haloperidol (HALDOL) 10 MG tablet; Take 1 tablet (10 mg total) by mouth 2 (two) times daily.  Dispense: 60 tablet; Refill: 3 - benztropine (COGENTIN) 1 MG tablet; Take 1 tablet (1 mg total) by  mouth 2 (two) times daily.  Dispense: 60 tablet; Refill: 3  Patient to follow up in 3 months Provider spent a total of 7 minutes with the patient/reviewing the patient's chart  Meta Hatchet, PA 07/06/2023, 6:05 PM

## 2023-10-05 ENCOUNTER — Telehealth (HOSPITAL_COMMUNITY): Payer: No Payment, Other | Admitting: Physician Assistant

## 2023-10-05 ENCOUNTER — Encounter (HOSPITAL_COMMUNITY): Payer: Self-pay | Admitting: Physician Assistant

## 2023-10-05 DIAGNOSIS — Z79899 Other long term (current) drug therapy: Secondary | ICD-10-CM | POA: Diagnosis not present

## 2023-10-05 DIAGNOSIS — F3160 Bipolar disorder, current episode mixed, unspecified: Secondary | ICD-10-CM | POA: Diagnosis not present

## 2023-10-05 MED ORDER — HALOPERIDOL 10 MG PO TABS
10.0000 mg | ORAL_TABLET | Freq: Two times a day (BID) | ORAL | 3 refills | Status: DC
Start: 2023-10-05 — End: 2024-01-04

## 2023-10-05 MED ORDER — BENZTROPINE MESYLATE 1 MG PO TABS
1.0000 mg | ORAL_TABLET | Freq: Two times a day (BID) | ORAL | 3 refills | Status: DC
Start: 2023-10-05 — End: 2024-01-04

## 2023-10-05 NOTE — Progress Notes (Signed)
 BH MD/PA/NP OP Progress Note  Virtual Visit via Video Note  I connected with Todd Shaw on 10/05/23 at  1:30 PM EDT by a video enabled telemedicine application and verified that I am speaking with the correct person using two identifiers.  Location: Patient: Home Provider: [facility]   I discussed the limitations of evaluation and management by telemedicine and the availability of in person appointments. The patient expressed understanding and agreed to proceed.  Follow Up Instructions:  I discussed the assessment and treatment plan with the patient. The patient was provided an opportunity to ask questions and all were answered. The patient agreed with the plan and demonstrated an understanding of the instructions.   The patient was advised to call back or seek an in-person evaluation if the symptoms worsen or if the condition fails to improve as anticipated.  I provided 8 minutes of non-face-to-face time during this encounter.  Meta Hatchet, PA    10/05/2023 7:19 PM Aurther Loft Torben Soloway  MRN:  [id number]  Chief Complaint:  Chief Complaint  Patient presents with   Follow-up   Medication Refill   HPI:   Curt Oatis. Ewen is a 38 year old male with a past psychiatric history significant for bipolar affective disorder (current episode mixed) who presents to [facility] for follow-up and medication management.  Patient is currently being managed on the following psychiatric medications:  Haloperidol 10 mg 2 times daily Benztropine 1 mg 2 times daily  Patient reports no issues or concerns regarding his current medication regimen. Patient denies experiencing any adverse side effects. Patient denies overt depressive symptoms nor does he endorse anxiety. Patient denies any new stressors at this time.  Patient is alert and oriented x 4, calm, cooperative, and fully engaged in conversation during the encounter.  Patient endorses great mood.   Patient exhibits euthymic mood with appropriate affect.  Patient denies suicidal or homicidal ideations.  He further denies auditory or visual hallucinations and does not appear to be responding to internal/external stimuli.  Patient endorses good sleep (on average 8 hours of sleep per night.  Patient endorses good appetite and eats on average 2 meals per day.  Patient denies alcohol consumption or illicit drug use.  Patient endorses tobacco use and smokes on average 10 to 12 cigarettes/day.  Visit Diagnosis:    ICD-10-CM   1. Long term current use of antipsychotic medication  Z79.899 Lipid Profile    CBC with Differential/Platelet    Comprehensive Metabolic Panel (CMET)    Hemoglobin A1c    2. Bipolar affective disorder, current episode mixed, current episode severity unspecified (HCC)  F31.60 haloperidol (HALDOL) 10 MG tablet    benztropine (COGENTIN) 1 MG tablet      Past Psychiatric History:  Bipolar Disorder Insomnia  Past Medical History:  Past Medical History:  Diagnosis Date   Bipolar 1 disorder (HCC)    Hallucinations    Psychosis (HCC)     Past Surgical History:  Procedure Laterality Date   APPLICATION OF A-CELL OF EXTREMITY Right 01/08/2017   Procedure: APPLICATION OF A-CELL RIGHT LOWER LEG;  Surgeon: Glenna Fellows, MD;  Location: [facility] OR;  Service: Plastics;  Laterality: Right;   APPLICATION OF WOUND VAC Right 01/01/2017   Procedure: RIGHT LEG WOUND VAC CHANGE with irrigation and DEBRIDEMENT of right leg fasciotomy site;  Surgeon: Larina Earthly, MD;  Location: [facility] OR;  Service: Vascular;  Laterality: Right;   APPLICATION OF WOUND VAC Right 01/03/2017   Procedure: WOUND  VAC CHANGE;  Surgeon: Sherren Kerns, MD;  Location: [facility] OR;  Service: Vascular;  Laterality: Right;   FASCIOTOMY Right 12/31/2016   Procedure: FASCIOTOMY, RIGHT LOWER EXTREMITY COMPARTMENT;  Surgeon: Maeola Harman, MD;  Location: [facility] OR;  Service: Vascular;  Laterality: Right;   I & D EXTREMITY  Right 01/03/2017   Procedure: RIGHT LOWER EXTREMITY FASCIOTOMY WASH-OUT. DEBRIDEMENT;  Surgeon: Sherren Kerns, MD;  Location: [facility] OR;  Service: Vascular;  Laterality: Right;   SKIN SPLIT GRAFT Right 01/08/2017   Procedure: SKIN GRAFT SPLIT THICKNESS from right thigh to right leg;  Surgeon: Glenna Fellows, MD;  Location: [facility] OR;  Service: Plastics;  Laterality: Right;    Family Psychiatric History:  No past family history of psychiatric illness   Family History:  Family History  Problem Relation Age of Onset   Mental illness Neg Hx     Social History:  Social History   Socioeconomic History   Marital status: Single    Spouse name: Not on file   Number of children: Not on file   Years of education: Not on file   Highest education level: Not on file  Occupational History   Not on file  Tobacco Use   Smoking status: Every Day    Current packs/day: 1.00    Types: Cigarettes   Smokeless tobacco: Never  Vaping Use   Vaping status: Never Used  Substance and Sexual Activity   Alcohol use: No    Comment: occas   Drug use: Yes    Types: Benzodiazepines, Marijuana   Sexual activity: Yes  Other Topics Concern   Not on file  Social History Narrative   ** Merged History Encounter **       Social Drivers of Health   Financial Resource Strain: Not on file  Food Insecurity: Not on file  Transportation Needs: Not on file  Physical Activity: Not on file  Stress: Not on file  Social Connections: Not on file    Allergies: No Known Allergies  Metabolic Disorder Labs: Lab Results  Component Value Date   HGBA1C 5.6 04/28/2020   MPG 114 04/28/2020   MPG 114.02 10/08/2019   Lab Results  Component Value Date   PROLACTIN 50.6 (H) 10/08/2019   PROLACTIN 110.3 (H) 11/08/2015   Lab Results  Component Value Date   CHOL 160 04/28/2020   TRIG 44 04/28/2020   HDL 50 04/28/2020   CHOLHDL 3.2 04/28/2020   VLDL 9 04/28/2020   LDLCALC 101 (H) 04/28/2020   LDLCALC 92 10/08/2019    Lab Results  Component Value Date   TSH 0.406 04/28/2020   TSH 0.850 10/08/2019    Therapeutic Level Labs: No results found for: "LITHIUM" No results found for: "VALPROATE" No results found for: "CBMZ"  Current Medications: Current Outpatient Medications  Medication Sig Dispense Refill   benztropine (COGENTIN) 1 MG tablet Take 1 tablet (1 mg total) by mouth 2 (two) times daily. 60 tablet 3   haloperidol (HALDOL) 10 MG tablet Take 1 tablet (10 mg total) by mouth 2 (two) times daily. 60 tablet 3   promethazine-dextromethorphan (PROMETHAZINE-DM) 6.25-15 MG/5ML syrup Take 5 mLs by mouth 4 (four) times daily as needed for cough. 140 mL 0   traZODone (DESYREL) 50 MG tablet Take 1 tablet (50 mg total) by mouth at bedtime. 30 tablet 3   trimethoprim-polymyxin b (POLYTRIM) ophthalmic solution Place 1 drop into the right eye every 6 (six) hours. 10 mL 0   No current facility-administered medications  for this visit.     Musculoskeletal: Strength & Muscle Tone: within normal limits Gait & Station: normal Patient leans: N/A  Psychiatric Specialty Exam: Review of Systems  Psychiatric/Behavioral:  Negative for decreased concentration, dysphoric mood, hallucinations, self-injury, sleep disturbance and suicidal ideas. The patient is not nervous/anxious and is not hyperactive.     There were no vitals taken for this visit.There is no height or weight on file to calculate BMI.  General Appearance: Casual  Eye Contact:  Good  Speech:  Clear and Coherent and Normal Rate  Volume:  Normal  Mood:  Euthymic  Affect:  Appropriate  Thought Process:  Coherent, Goal Directed, and Descriptions of Associations: Intact  Orientation:  Full (Time, Place, and Person)  Thought Content: WDL   Suicidal Thoughts:  No  Homicidal Thoughts:  No  Memory:  Immediate;   Good Recent;   Good Remote;   Good  Judgement:  Good  Insight:  Good  Psychomotor Activity:  Normal  Concentration:  Concentration: Good  and Attention Span: Good  Recall:  Good  Fund of Knowledge: Fair  Language: Good  Akathisia:  No  Handed:  Right  AIMS (if indicated): not done  Assets:  Communication Skills Desire for Improvement Housing Vocational/Educational  ADL's:  Intact  Cognition: WNL  Sleep:  Good   Screenings: AIMS    Flowsheet Row [facility] ([facility] Ad[facility] (Discharged) from [facility]  AIMS Total Score 0 0 0 0      AUDIT    Flowsheet Row [facility]) [facility] Ad[facility] (Dis[facility] (Dis[facility] Ad[facility] (Discharged) from [facility]  Alcohol Use Disorder Identification Test Final Score (AUDIT) 0 0 0 0      GAD-7    Flowsheet Row Video Visit from 10/05/2023 in [facility] Video Visit from 07/06/2023 in [facility] Video Visit from 04/06/2023 in [facility] Clinical Support from 02/02/2023 in [facility] Clinical Support from 12/15/2022 in [facility]  Total GAD-7 Score 0 0 0 0 0      PHQ2-9    Flowsheet Row Video Visit from 10/05/2023 in [facility] Video Visit from 07/06/2023 in [facility] Video Visit from 04/06/2023 in [facility] Clinical Support from 02/02/2023 in [facility] Clinical  Support from 12/15/2022 in [facility]  PHQ-2 Total Score 0 0 0 0 0      Flowsheet Row Video Visit from 10/05/2023 in [facility] Video Visit from 07/06/2023 in [facility] Video Visit from 04/06/2023 in [facility]  C-SSRS RISK CATEGORY No Risk No Risk No Risk        Assessment and Plan:   Benett Swoyer. [place] is a 38 year old male with a past psychiatric history significant for bipolar affective disorder (current episode mixed) who presents to [facility] for follow-up and medication management.  Patient presents today encounter  reporting no issues or concerns regarding his current medication regimen.  Patient denies experiencing any adverse side effects and further denies the need for dosage adjustments at this time.  Patient denies overt depressive symptoms nor does he endorse anxiety.  Patient further denies auditory or visual hallucinations and does not appear to be responding to internal/external stimuli.  Patient endorses stability on his current medication regimen and would like to continue taking his medication as prescribed.  Due to his use of Haldol, provider to obtain the following labs from the patient: Lipid profile, complete metabolic panel, complete blood count with differential, and hemoglobin A1c.  Patient vocalized understanding.  Collaboration of Care: Collaboration of Care: Medication Management AEB provider managing patient's psychiatric medications and Psychiatrist AEB patient being followed by a mental health provider  Patient/Guardian was advised Release of Information must be obtained prior to any record release in order to collaborate their care with an outside provider. Patient/Guardian was advised if they have not already done so to contact the registration department to sign all necessary forms in order for [place] to release  information regarding their care.   Consent: Patient/Guardian gives verbal consent for treatment and assignment of benefits for services provided during this visit. Patient/Guardian expressed understanding and agreed to proceed.   1. Bipolar affective disorder, current episode mixed, current episode severity unspecified (HCC)  - haloperidol (HALDOL) 10 MG tablet; Take 1 tablet (10 mg total) by mouth 2 (two) times daily.  Dispense: 60 tablet; Refill: 3 - benztropine (COGENTIN) 1 MG tablet; Take 1 tablet (1 mg total) by mouth 2 (two) times daily.  Dispense: 60 tablet; Refill: 3  2. Long term current use of antipsychotic medication (Primary)  - Lipid Profile; Future - CBC with Differential/Platelet; Future - Comprehensive Metabolic Panel (CMET); Future - Hemoglobin A1c; Future  Patient to follow up in 3 months Provider spent a total of 8 minutes with the patient/reviewing the patient's chart  Meta Hatchet, PA 10/05/2023, 7:19 PM

## 2024-01-04 ENCOUNTER — Encounter (HOSPITAL_COMMUNITY): Payer: Self-pay | Admitting: Physician Assistant

## 2024-01-04 ENCOUNTER — Ambulatory Visit (INDEPENDENT_AMBULATORY_CARE_PROVIDER_SITE_OTHER): Admitting: Physician Assistant

## 2024-01-04 VITALS — BP 139/85 | HR 81 | Temp 98.2°F | Ht 68.0 in | Wt 199.0 lb

## 2024-01-04 DIAGNOSIS — F3160 Bipolar disorder, current episode mixed, unspecified: Secondary | ICD-10-CM

## 2024-01-04 DIAGNOSIS — Z79899 Other long term (current) drug therapy: Secondary | ICD-10-CM | POA: Diagnosis not present

## 2024-01-04 MED ORDER — HALOPERIDOL 10 MG PO TABS: 10.0000 mg | ORAL_TABLET | Freq: Two times a day (BID) | ORAL | 3 refills | Status: DC

## 2024-01-04 MED ORDER — BENZTROPINE MESYLATE 1 MG PO TABS
1.0000 mg | ORAL_TABLET | Freq: Two times a day (BID) | ORAL | 3 refills | Status: DC
Start: 2024-01-04 — End: 2024-04-03

## 2024-01-04 NOTE — Progress Notes (Unsigned)
 [facility] MD/PA/NP OP Progress Note  01/04/2024 7:54 PM Todd Shaw  MRN:  [id number]  Chief Complaint:  Chief Complaint  Patient presents with   Follow-up   Medication Refill   HPI:   Todd Shaw. [name] is a 38 year old male with a past psychiatric history significant for bipolar affective disorder (current episode mixed) who presents to [facility] for follow-up and medication management.  Patient is currently being managed on the following psychiatric medications:  Haloperidol  10 mg 2 times daily Benztropine  1 mg 2 times daily  Patient presents to the encounter stating that he has been taking his medications regularly.  Patient denies experiencing any adverse side effects associated with his current medication regimen.  Patient denies overt depressive symptoms nor does he endorse anxiety.  Patient denies changes in his behavior or paranoia.  He further denies auditory or visual hallucinations and does not appear to be responding to internal/external stimuli.  A PHQ-9 screen was performed with the patient scoring a 0.  A GAD-7 screen was also performed with the patient scoring a 0.  Patient is alert and oriented x 4, calm, cooperative, and fully engaged in conversation during the encounter.  Patient endorses good mood.  Patient exhibits euthymic mood with appropriate affect.  Patient denies suicidal or homicidal ideations.  He further denies auditory or visual hallucinations and does not appear to be responding to internal/external stimuli.  Patient endorses good sleep and receives on average 8 hours of sleep per night.  Patient endorses good appetite and eats on average 2-3 meals per day.  Patient endorses alcohol consumption on occasion.  Patient endorses tobacco use and smokes on average 15 cigarettes/day.  Patient denies illicit drug use.  Visit Diagnosis:    ICD-10-CM   1. Long term current use of antipsychotic medication  Z79.899 Lipid panel    CBC  with Differential/Platelet    Comprehensive Metabolic Panel (CMET)    Hemoglobin A1c    Hemoglobin A1c    Comprehensive Metabolic Panel (CMET)    CBC with Differential/Platelet    Lipid panel    Ambulatory referral to Cardiology    2. Bipolar affective disorder, current episode mixed, current episode severity unspecified (HCC)  F31.60 benztropine  (COGENTIN ) 1 MG tablet    haloperidol  (HALDOL ) 10 MG tablet      Past Psychiatric History:  Bipolar Disorder Insomnia  Past Medical History:  Past Medical History:  Diagnosis Date   Bipolar 1 disorder (HCC)    Hallucinations    Psychosis (HCC)     Past Surgical History:  Procedure Laterality Date   APPLICATION OF A-CELL OF EXTREMITY Right 01/08/2017   Procedure: APPLICATION OF A-CELL RIGHT LOWER LEG;  Surgeon: Arelia Filippo, MD;  Location: [facility] OR;  Service: Plastics;  Laterality: Right;   APPLICATION OF WOUND VAC Right 01/01/2017   Procedure: RIGHT LEG WOUND VAC CHANGE with irrigation and DEBRIDEMENT of right leg fasciotomy site;  Surgeon: Oris Krystal FALCON, MD;  Location: [facility] OR;  Service: Vascular;  Laterality: Right;   APPLICATION OF WOUND VAC Right 01/03/2017   Procedure: WOUND VAC CHANGE;  Surgeon: Harvey Carlin BRAVO, MD;  Location: [facility] OR;  Service: Vascular;  Laterality: Right;   FASCIOTOMY Right 12/31/2016   Procedure: FASCIOTOMY, RIGHT LOWER EXTREMITY COMPARTMENT;  Surgeon: Sheree Penne Bruckner, MD;  Location: [facility] OR;  Service: Vascular;  Laterality: Right;   I & D EXTREMITY Right 01/03/2017   Procedure: RIGHT LOWER EXTREMITY FASCIOTOMY WASH-OUT. DEBRIDEMENT;  Surgeon: Fields, Charles E,  MD;  Location: [facility] OR;  Service: Vascular;  Laterality: Right;   SKIN SPLIT GRAFT Right 01/08/2017   Procedure: SKIN GRAFT SPLIT THICKNESS from right thigh to right leg;  Surgeon: Arelia Filippo, MD;  Location: [facility] OR;  Service: Plastics;  Laterality: Right;    Family Psychiatric History:  No past family history of psychiatric illness   Family  History:  Family History  Problem Relation Age of Onset   Mental illness Neg Hx     Social History:  Social History   Socioeconomic History   Marital status: Single    Spouse name: Not on file   Number of children: Not on file   Years of education: Not on file   Highest education level: Not on file  Occupational History   Not on file  Tobacco Use   Smoking status: Every Day    Current packs/day: 1.00    Types: Cigarettes   Smokeless tobacco: Never  Vaping Use   Vaping status: Never Used  Substance and Sexual Activity   Alcohol use: No    Comment: occas   Drug use: Yes    Types: Benzodiazepines, Marijuana   Sexual activity: Yes  Other Topics Concern   Not on file  Social History Narrative   ** Merged History Encounter **       Social Drivers of Health   Financial Resource Strain: Not on file  Food Insecurity: Not on file  Transportation Needs: Not on file  Physical Activity: Not on file  Stress: Not on file  Social Connections: Not on file    Allergies: No Known Allergies  Metabolic Disorder Labs: Lab Results  Component Value Date   HGBA1C 5.6 04/28/2020   MPG 114 04/28/2020   MPG 114.02 10/08/2019   Lab Results  Component Value Date   PROLACTIN 50.6 (H) 10/08/2019   PROLACTIN 110.3 (H) 11/08/2015   Lab Results  Component Value Date   CHOL 160 04/28/2020   TRIG 44 04/28/2020   HDL 50 04/28/2020   CHOLHDL 3.2 04/28/2020   VLDL 9 04/28/2020   LDLCALC 101 (H) 04/28/2020   LDLCALC 92 10/08/2019   Lab Results  Component Value Date   TSH 0.406 04/28/2020   TSH 0.850 10/08/2019    Therapeutic Level Labs: No results found for: LITHIUM No results found for: VALPROATE No results found for: CBMZ  Current Medications: Current Outpatient Medications  Medication Sig Dispense Refill   benztropine  (COGENTIN ) 1 MG tablet Take 1 tablet (1 mg total) by mouth 2 (two) times daily. 60 tablet 3   haloperidol  (HALDOL ) 10 MG tablet Take 1 tablet (10 mg  total) by mouth 2 (two) times daily. 60 tablet 3   promethazine -dextromethorphan (PROMETHAZINE -DM) 6.25-15 MG/5ML syrup Take 5 mLs by mouth 4 (four) times daily as needed for cough. 140 mL 0   traZODone  (DESYREL ) 50 MG tablet Take 1 tablet (50 mg total) by mouth at bedtime. 30 tablet 3   trimethoprim -polymyxin b  (POLYTRIM ) ophthalmic solution Place 1 drop into the right eye every 6 (six) hours. 10 mL 0   No current facility-administered medications for this visit.     Musculoskeletal: Strength & Muscle Tone: within normal limits Gait & Station: normal Patient leans: N/A  Psychiatric Specialty Exam: Review of Systems  Psychiatric/Behavioral:  Negative for decreased concentration, dysphoric mood, hallucinations, self-injury, sleep disturbance and suicidal ideas. The patient is not nervous/anxious and is not hyperactive.     Blood pressure 139/85, pulse 81, temperature 98.2 F (36.8 C), temperature  source Oral, height 5' 8 (1.727 m), weight 199 lb (90.3 kg), SpO2 100%.Body mass index is 30.26 kg/m.  General Appearance: Casual  Eye Contact:  Good  Speech:  Clear and Coherent and Normal Rate  Volume:  Normal  Mood:  Euthymic  Affect:  Appropriate  Thought Process:  Coherent, Goal Directed, and Descriptions of Associations: Intact  Orientation:  Full (Time, Place, and Person)  Thought Content: WDL   Suicidal Thoughts:  No  Homicidal Thoughts:  No  Memory:  Immediate;   Good Recent;   Good Remote;   Good  Judgement:  Good  Insight:  Good  Psychomotor Activity:  Normal  Concentration:  Concentration: Good and Attention Span: Good  Recall:  Good  Fund of Knowledge: Fair  Language: Good  Akathisia:  No  Handed:  Right  AIMS (if indicated): not done  Assets:  Communication Skills Desire for Improvement Housing Vocational/Educational  ADL's:  Intact  Cognition: WNL  Sleep:  Good   Screenings: AIMS    Flowsheet Row Clinical Support from 01/04/2024 in [facility]) [facility] (Dis[facility]) from OP [facility]  AIMS Total Score 0 0 0 0 0   AUDIT    Flowsheet Row [facility]) [facility] ([facility] from [facility] (Dis[facility] (Discharged) from OP [facility]  Alcohol Use Disorder Identification Test Final Score (AUDIT) 0 0 0 0   GAD-7    Flowsheet Row Clinical Support from 01/04/2024 in [facility] Video Visit from 10/05/2023 in [facility] Video Visit from 07/06/2023 in [facility] Video Visit from 04/06/2023 in [facility] Clinical Support from 02/02/2023 in [facility]  Total GAD-7 Score 0 0 0 0 0   PHQ2-9    Flowsheet Row Clinical Support from 01/04/2024 in [facility] Video Visit from 10/05/2023 in [facility] Video Visit from 07/06/2023 in [facility] Video Visit from 04/06/2023 in [facility] Clinical Support from 02/02/2023 in [facility]  PHQ-2 Total Score 0 0 0 0 0   Flowsheet Row Clinical Support from 01/04/2024 in [facility] Video Visit from 10/05/2023 in [facility] Video Visit from 07/06/2023 in [facility]  C-SSRS RISK CATEGORY No Risk No Risk No Risk     Assessment and Plan:   Todd Shaw is a 38 year old male with a past psychiatric history significant for bipolar affective disorder (current episode mixed) who presents to [facility] for follow-up and medication management.  Patient presents to the encounter stating that he continues to take his medications regularly and denies experiencing any adverse side effects from his current medication regimen.  An aims assessment was performed with the patient scoring of 0.  Patient did not appear to be exhibiting any involuntary movements during the assessment.  Patient denies overt depressive symptoms nor does he endorse anxiety.  Patient  further denies changes in his behavior or paranoia.  Patient does not appear to be responding to internal/external stimuli.  Patient endorses stability on his current medication regimen and would like to continue taking his medications as prescribed.  Patient's medications to be e-prescribed to pharmacy of choice.  Due to his use of Haldol , patient was informed of the following labs would need to be obtained: Lipid profile, complete blood count with differential, comprehensive metabolic panel, and hemoglobin A1c.  Provider to refer patient out to cardiology for an up-to-date EKG.  Patient vocalized understanding.  Patient denies suicidal ideations and is able to contract for safety following the conclusion of the encounter.  Collaboration of Care: Collaboration of Care: Medication Management AEB provider managing patient's psychiatric medications and Psychiatrist AEB patient being followed by a mental health provider  Patient/Guardian was advised Release of Information must be obtained prior to any record release in order to collaborate their care with an outside provider. Patient/Guardian was advised if they have not already done so to contact the registration  department to sign all necessary forms in order for us  to release information regarding their care.   Consent: Patient/Guardian gives verbal consent for treatment and assignment of benefits for services provided during this visit. Patient/Guardian expressed understanding and agreed to proceed.   1. Bipolar affective disorder, current episode mixed, current episode severity unspecified (HCC)  - benztropine  (COGENTIN ) 1 MG tablet; Take 1 tablet (1 mg total) by mouth 2 (two) times daily.  Dispense: 60 tablet; Refill: 3 - haloperidol  (HALDOL ) 10 MG tablet; Take 1 tablet (10 mg total) by mouth 2 (two) times daily.  Dispense: 60 tablet; Refill: 3  2. Long term current use of antipsychotic medication (Primary)  - Lipid panel; Future - CBC with Differential/Platelet; Future - Comprehensive Metabolic Panel (CMET); Future - Hemoglobin A1c; Future - Ambulatory referral to Cardiology  Patient to follow up in 3 months Provider spent a total of 20 minutes with the patient/reviewing the patient's chart  Reginia FORBES Bolster, PA 01/04/2024, 7:54 PM

## 2024-04-03 ENCOUNTER — Encounter (HOSPITAL_COMMUNITY): Payer: Self-pay | Admitting: Physician Assistant

## 2024-04-03 ENCOUNTER — Telehealth (INDEPENDENT_AMBULATORY_CARE_PROVIDER_SITE_OTHER): Admitting: Physician Assistant

## 2024-04-03 DIAGNOSIS — F3160 Bipolar disorder, current episode mixed, unspecified: Secondary | ICD-10-CM

## 2024-04-03 DIAGNOSIS — Z79899 Other long term (current) drug therapy: Secondary | ICD-10-CM | POA: Diagnosis not present

## 2024-04-03 MED ORDER — HALOPERIDOL 10 MG PO TABS: 10.0000 mg | ORAL_TABLET | Freq: Two times a day (BID) | ORAL | 3 refills | Status: DC

## 2024-04-03 MED ORDER — BENZTROPINE MESYLATE 1 MG PO TABS: 1.0000 mg | ORAL_TABLET | Freq: Two times a day (BID) | ORAL | 3 refills | Status: DC

## 2024-04-03 NOTE — Progress Notes (Signed)
 [facility] MD/PA/NP OP Progress Note  Virtual Visit via Video Note  I connected with Todd Shaw on 04/03/24 at  2:30 PM EDT by a video enabled telemedicine application and verified that I am speaking with the correct person using two identifiers.  Location: Patient: Home Provider: Clinic   I discussed the limitations of evaluation and management by telemedicine and the availability of in person appointments. The patient expressed understanding and agreed to proceed.  Follow Up Instructions:   I discussed the assessment and treatment plan with the patient. The patient was provided an opportunity to ask questions and all were answered. The patient agreed with the plan and demonstrated an understanding of the instructions.   The patient was advised to call back or seek an in-person evaluation if the symptoms worsen or if the condition fails to improve as anticipated.  I provided 15 minutes of non-face-to-face time during this encounter.  Todd FORBES Bolster, PA   04/03/2024 8:06 PM Todd Shaw  MRN:  [id number]  Chief Complaint:  Chief Complaint  Patient presents with   Follow-up   Medication Refill   HPI:   Todd Shaw. [name] is a 38 year old male with a past psychiatric history significant for bipolar affective disorder (current episode mixed) who presents to [facility] via virtual video visit for follow-up and medication management.  Patient is currently being managed on the following psychiatric medications:  Haloperidol  10 mg 2 times daily Benztropine  1 mg 2 times daily  Patient presents to the encounter stating that he continues to take his medications regularly and denies experiencing any adverse side effects.  Patient denies overt depressive symptoms nor does he endorse anxiety.  Patient denies experiencing recent episodes of mania.  Patient further denies any other issues or concerns at this time.  A PHQ-9 screen was performed with  the patient scoring a 0.  A GAD-7 screen was also performed with the patient scoring a 0.  Patient is alert and oriented x 4, calm, cooperative, and fully engaged in conversation during the encounter.  Patient endorses good mood.  Patient exhibits euthymic mood with appropriate affect.  Patient denies suicidal or homicidal ideations.  He further denies auditory or visual hallucinations and does not appear to be responding to internal/external stimuli.  Patient endorses good sleep and receives on average 8 hours of sleep per night.  Patient endorses good appetite and eats on average 2-3 meals per day.  Patient denies alcohol consumption or illicit drug use.  Patient endorses tobacco use and smokes on average a pack per day.  Visit Diagnosis:    ICD-10-CM   1. Long term current use of antipsychotic medication  Z79.899     2. Bipolar affective disorder, current episode mixed, current episode severity unspecified (HCC)  F31.60 benztropine  (COGENTIN ) 1 MG tablet    haloperidol  (HALDOL ) 10 MG tablet      Past Psychiatric History:  Bipolar Disorder Insomnia  Past Medical History:  Past Medical History:  Diagnosis Date   Bipolar 1 disorder (HCC)    Hallucinations    Psychosis (HCC)     Past Surgical History:  Procedure Laterality Date   APPLICATION OF A-CELL OF EXTREMITY Right 01/08/2017   Procedure: APPLICATION OF A-CELL RIGHT LOWER LEG;  Surgeon: Arelia Filippo, MD;  Location: [facility] OR;  Service: Plastics;  Laterality: Right;   APPLICATION OF WOUND VAC Right 01/01/2017   Procedure: RIGHT LEG WOUND VAC CHANGE with irrigation and DEBRIDEMENT of right leg fasciotomy site;  Surgeon: Oris Krystal FALCON, MD;  Location: [facility] OR;  Service: Vascular;  Laterality: Right;   APPLICATION OF WOUND VAC Right 01/03/2017   Procedure: WOUND VAC CHANGE;  Surgeon: Harvey Carlin BRAVO, MD;  Location: [facility] OR;  Service: Vascular;  Laterality: Right;   FASCIOTOMY Right 12/31/2016   Procedure: FASCIOTOMY, RIGHT LOWER EXTREMITY  COMPARTMENT;  Surgeon: Sheree Penne Bruckner, MD;  Location: [facility] OR;  Service: Vascular;  Laterality: Right;   I & D EXTREMITY Right 01/03/2017   Procedure: RIGHT LOWER EXTREMITY FASCIOTOMY WASH-OUT. DEBRIDEMENT;  Surgeon: Harvey Carlin BRAVO, MD;  Location: [facility] OR;  Service: Vascular;  Laterality: Right;   SKIN SPLIT GRAFT Right 01/08/2017   Procedure: SKIN GRAFT SPLIT THICKNESS from right thigh to right leg;  Surgeon: Arelia Filippo, MD;  Location: [facility] OR;  Service: Plastics;  Laterality: Right;    Family Psychiatric History:  No past family history of psychiatric illness   Family History:  Family History  Problem Relation Age of Onset   Mental illness Neg Hx     Social History:  Social History   Socioeconomic History   Marital status: Single    Spouse name: Not on file   Number of children: Not on file   Years of education: Not on file   Highest education level: Not on file  Occupational History   Not on file  Tobacco Use   Smoking status: Every Day    Current packs/day: 1.00    Types: Cigarettes   Smokeless tobacco: Never  Vaping Use   Vaping status: Never Used  Substance and Sexual Activity   Alcohol use: No    Comment: occas   Drug use: Yes    Types: Benzodiazepines, Marijuana   Sexual activity: Yes  Other Topics Concern   Not on file  Social History Narrative   ** Merged History Encounter **       Social Drivers of Health   Financial Resource Strain: Not on file  Food Insecurity: Not on file  Transportation Needs: Not on file  Physical Activity: Not on file  Stress: Not on file  Social Connections: Not on file    Allergies: No Known Allergies  Metabolic Disorder Labs: Lab Results  Component Value Date   HGBA1C 5.6 04/28/2020   MPG 114 04/28/2020   MPG 114.02 10/08/2019   Lab Results  Component Value Date   PROLACTIN 50.6 (H) 10/08/2019   PROLACTIN 110.3 (H) 11/08/2015   Lab Results  Component Value Date   CHOL 160 04/28/2020   TRIG 44  04/28/2020   HDL 50 04/28/2020   CHOLHDL 3.2 04/28/2020   VLDL 9 04/28/2020   LDLCALC 101 (H) 04/28/2020   LDLCALC 92 10/08/2019   Lab Results  Component Value Date   TSH 0.406 04/28/2020   TSH 0.850 10/08/2019    Therapeutic Level Labs: No results found for: LITHIUM No results found for: VALPROATE No results found for: CBMZ  Current Medications: Current Outpatient Medications  Medication Sig Dispense Refill   benztropine  (COGENTIN ) 1 MG tablet Take 1 tablet (1 mg total) by mouth 2 (two) times daily. 60 tablet 3   haloperidol  (HALDOL ) 10 MG tablet Take 1 tablet (10 mg total) by mouth 2 (two) times daily. 60 tablet 3   promethazine -dextromethorphan (PROMETHAZINE -DM) 6.25-15 MG/5ML syrup Take 5 mLs by mouth 4 (four) times daily as needed for cough. 140 mL 0   traZODone  (DESYREL ) 50 MG tablet Take 1 tablet (50 mg total) by mouth at bedtime. 30 tablet 3  trimethoprim -polymyxin b  (POLYTRIM ) ophthalmic solution Place 1 drop into the right eye every 6 (six) hours. 10 mL 0   No current facility-administered medications for this visit.     Musculoskeletal: Strength & Muscle Tone: within normal limits Gait & Station: normal Patient leans: N/A  Psychiatric Specialty Exam: Review of Systems  Psychiatric/Behavioral:  Negative for decreased concentration, dysphoric mood, hallucinations, self-injury, sleep disturbance and suicidal ideas. The patient is not nervous/anxious and is not hyperactive.     There were no vitals taken for this visit.There is no height or weight on file to calculate BMI.  General Appearance: Casual  Eye Contact:  Good  Speech:  Clear and Coherent and Normal Rate  Volume:  Normal  Mood:  Euthymic  Affect:  Appropriate  Thought Process:  Coherent, Goal Directed, and Descriptions of Associations: Intact  Orientation:  Full (Time, Place, and Person)  Thought Content: WDL   Suicidal Thoughts:  No  Homicidal Thoughts:  No  Memory:  Immediate;    Good Recent;   Good Remote;   Good  Judgement:  Good  Insight:  Good  Psychomotor Activity:  Normal  Concentration:  Concentration: Good and Attention Span: Good  Recall:  Good  Fund of Knowledge: Fair  Language: Good  Akathisia:  No  Handed:  Right  AIMS (if indicated): not done  Assets:  Communication Skills Desire for Improvement Housing Vocational/Educational  ADL's:  Intact  Cognition: WNL  Sleep:  Good   Screenings: AIMS    Flowsheet Row Video Visit from 04/03/2024 in [facility] Clinical Support from 01/04/2024 in [facility]) [facility]) [facility]  AIMS Total Score 0 0 0 0 0   AUDIT    Flowsheet Row [facility]) [facility] ([facility] from [facility] ([facility] (Discharged) from OP [facility]  Alcohol Use Disorder Identification Test Final Score (AUDIT) 0 0 0 0   GAD-7    Flowsheet Row Video Visit from 04/03/2024 in [facility] Clinical Support from 01/04/2024 in [facility] Video Visit from 10/05/2023 in [facility] Video Visit from 07/06/2023 in [facility] Video Visit from 04/06/2023 in [facility]  Total GAD-7 Score 0 0 0 0 0   PHQ2-9    Flowsheet Row Video Visit from 04/03/2024 in [facility] Clinical Support from 01/04/2024 in [facility] Video Visit from 10/05/2023 in [facility] Video Visit from 07/06/2023 in [facility] Video Visit from 04/06/2023 in [place]  PHQ-2 Total Score 0 0 0 0 0   Flowsheet Row Video Visit from 04/03/2024 in [facility] Clinical Support from 01/04/2024 in [facility] Video Visit from 10/05/2023 in [facility]  C-SSRS RISK CATEGORY No Risk No Risk No Risk     Assessment and Plan:   Firas Guardado. Kraeger is a 38 year old male with a past psychiatric history significant for bipolar affective disorder (current episode mixed) who presents  to [facility] via virtual video visit for follow-up and medication management.  Patient presents to the encounter stating that he continues to take his medication regularly and denies experiencing any adverse side effects.  An aims assessment was performed with the patient scoring a 0.  Patient denies overt depressive symptoms nor does he endorse anxiety.  Patient denies experiencing recent episodes of mania.  A PHQ-9 screen was performed with the patient scoring a 0.  A GAD-7 screen was also performed with the patient scoring a 0.  Patient endorses stability on his current medication regimen and would like to continue taking his medications as prescribed.  Provider reminded patient that he has pending labs due to his use of Haldol .  Provider also reminded patient that he needs an up-to-date EKG.  Patient verbalized understanding.  A [place] Suicide Severity Rating Scale was performed with the patient being considered no risk.  Patient denies suicidal ideations and is able to contract for safety at this time.  Collaboration of Care: Collaboration of Care: Medication Management AEB provider managing patient's psychiatric medications and Psychiatrist  AEB patient being followed by a mental health provider  Patient/Guardian was advised Release of Information must be obtained prior to any record release in order to collaborate their care with an outside provider. Patient/Guardian was advised if they have not already done so to contact the registration department to sign all necessary forms in order for us  to release information regarding their care.   Consent: Patient/Guardian gives verbal consent for treatment and assignment of benefits for services provided during this visit. Patient/Guardian expressed understanding and agreed to proceed.   1. Bipolar affective disorder, current episode mixed, current episode severity unspecified (HCC)  - benztropine  (COGENTIN ) 1 MG tablet; Take 1 tablet (1 mg total) by mouth 2 (two) times daily.  Dispense: 60 tablet; Refill: 3 - haloperidol  (HALDOL ) 10 MG tablet; Take 1 tablet (10 mg total) by mouth 2 (two) times daily.  Dispense: 60 tablet; Refill: 3  2. Long term current use of antipsychotic medication (Primary) Pending labs  Patient to follow up in 3 months Provider spent a total of 15 minutes with the patient/reviewing the patient's chart  Todd FORBES Bolster, PA 04/03/2024, 8:06 PM

## 2024-04-04 ENCOUNTER — Telehealth (HOSPITAL_COMMUNITY): Admitting: Physician Assistant

## 2024-07-11 ENCOUNTER — Encounter (HOSPITAL_COMMUNITY): Payer: Self-pay

## 2024-07-11 ENCOUNTER — Telehealth (HOSPITAL_COMMUNITY): Admitting: Physician Assistant

## 2024-07-16 ENCOUNTER — Telehealth (INDEPENDENT_AMBULATORY_CARE_PROVIDER_SITE_OTHER): Admitting: Physician Assistant

## 2024-07-16 DIAGNOSIS — Z79899 Other long term (current) drug therapy: Secondary | ICD-10-CM

## 2024-07-16 DIAGNOSIS — F3160 Bipolar disorder, current episode mixed, unspecified: Secondary | ICD-10-CM | POA: Diagnosis not present

## 2024-07-19 MED ORDER — BENZTROPINE MESYLATE 1 MG PO TABS: 1.0000 mg | ORAL_TABLET | Freq: Two times a day (BID) | ORAL | 3 refills | Status: AC

## 2024-07-19 MED ORDER — HALOPERIDOL 10 MG PO TABS: 10.0000 mg | ORAL_TABLET | Freq: Two times a day (BID) | ORAL | 3 refills | Status: AC

## 2024-08-08 ENCOUNTER — Encounter (HOSPITAL_COMMUNITY): Payer: Self-pay | Admitting: Physician Assistant

## 2024-08-08 NOTE — Progress Notes (Cosign Needed)
 BH MD/PA/NP OP Progress Note  Virtual Visit via Video Note  I connected with Todd Shaw on 07/16/24 at 10:00 AM EST by a video enabled telemedicine application and verified that I am speaking with the correct person using two identifiers.  Location: Patient: Home Provider: Clinic   I discussed the limitations of evaluation and management by telemedicine and the availability of in person appointments. The patient expressed understanding and agreed to proceed.  Follow Up Instructions:   I discussed the assessment and treatment plan with the patient. The patient was provided an opportunity to ask questions and all were answered. The patient agreed with the plan and demonstrated an understanding of the instructions.   The patient was advised to call back or seek an in-person evaluation if the symptoms worsen or if the condition fails to improve as anticipated.  I provided 8 minutes of non-face-to-face time during this encounter.  Reginia FORBES Bolster, PA   07/16/2024 10:00 AM Todd Shaw  MRN:  [id number]  Chief Complaint:  Chief Complaint  Patient presents with   Follow-up   Medication Refill   HPI:   Todd Shaw is a 39 year old male with a past psychiatric history significant for bipolar affective disorder (current episode mixed) who presents to [facility] via virtual video visit for follow-up and medication management.  Patient is currently being managed on the following psychiatric medications:  Haloperidol  10 mg 2 times daily Benztropine  1 mg 2 times daily  Patient presents to the encounter stating that he continues to take his medications regularly and denies experiencing any adverse side effects.  Patient denies overt depressive symptoms nor does he endorse anxiety.  Patient denies experiencing recent episodes of mania.  Patient further denies any new stressors at this time.  A PHQ 2 screen was performed with the patient  scoring a 0.  A GAD-7 screen was also performed with the patient scoring a 0.  Patient is alert and oriented x 4, calm, cooperative, and fully engaged in conversation during the encounter.  Patient endorses great mood.  Patient exhibits euthymic mood with appropriate affect.  Patient denies suicidal or homicidal ideations.  He further denies auditory or visual hallucinations and does not appear to be responding to internal/external stimuli.  Patient endorses good sleep and receives on average 8 hours of sleep per night.  Patient endorses good appetite and eats on average 2-3 meals per day.  Patient denies alcohol consumption or illicit drug use.  Patient endorses tobacco use and smokes on average a pack per day.  Visit Diagnosis:    ICD-10-CM   1. Long term current use of antipsychotic medication  Z79.899 HgB A1c    Lipid panel    Comprehensive Metabolic Panel (CMET)    CBC with Differential/Platelet    CBC with Differential/Platelet    Comprehensive Metabolic Panel (CMET)    Lipid panel    HgB A1c    2. Bipolar affective disorder, current episode mixed, current episode severity unspecified (HCC)  F31.60 benztropine  (COGENTIN ) 1 MG tablet    haloperidol  (HALDOL ) 10 MG tablet      Past Psychiatric History:  Bipolar Disorder Insomnia  Past Medical History:  Past Medical History:  Diagnosis Date   Bipolar 1 disorder (HCC)    Hallucinations    Psychosis (HCC)     Past Surgical History:  Procedure Laterality Date   APPLICATION OF A-CELL OF EXTREMITY Right 01/08/2017   Procedure: APPLICATION OF A-CELL RIGHT LOWER LEG;  Surgeon: Arelia Filippo, MD;  Location: [facility] OR;  Service: Plastics;  Laterality: Right;   APPLICATION OF WOUND VAC Right 01/01/2017   Procedure: RIGHT LEG WOUND VAC CHANGE with irrigation and DEBRIDEMENT of right leg fasciotomy site;  Surgeon: Oris Krystal FALCON, MD;  Location: [facility] OR;  Service: Vascular;  Laterality: Right;   APPLICATION OF WOUND VAC Right 01/03/2017    Procedure: WOUND VAC CHANGE;  Surgeon: Harvey Carlin BRAVO, MD;  Location[facility];  Service: Vascular;  Laterality: Right;   FASCIOTOMY Right 12/31/2016   Procedure: FASCIOTOMY, RIGHT LOWER EXTREMITY COMPARTMENT;  Surgeon: Sheree Penne Bruckner, MD;  Location: [facility] OR;  Service: Vascular;  Laterality: Right;   I & D EXTREMITY Right 01/03/2017   Procedure: RIGHT LOWER EXTREMITY FASCIOTOMY WASH-OUT. DEBRIDEMENT;  Surgeon: Harvey Carlin BRAVO, MD;  Location: [facility] OR;  Service: Vascular;  Laterality: Right;   SKIN SPLIT GRAFT Right 01/08/2017   Procedure: SKIN GRAFT SPLIT THICKNESS from right thigh to right leg;  Surgeon: Arelia Filippo, MD;  Location: [facility] OR;  Service: Plastics;  Laterality: Right;    Family Psychiatric History:  No past family history of psychiatric illness   Family History:  Family History  Problem Relation Age of Onset   Mental illness Neg Hx     Social History:  Social History   Socioeconomic History   Marital status: Single    Spouse name: Not on file   Number of children: Not on file   Years of education: Not on file   Highest education level: Not on file  Occupational History   Not on file  Tobacco Use   Smoking status: Every Day    Current packs/day: 1.00    Types: Cigarettes   Smokeless tobacco: Never  Vaping Use   Vaping status: Never Used  Substance and Sexual Activity   Alcohol use: No    Comment: occas   Drug use: Yes    Types: Benzodiazepines, Marijuana   Sexual activity: Yes  Other Topics Concern   Not on file  Social History Narrative   ** Merged History Encounter **       Social Drivers of Health   Tobacco Use: High Risk (08/08/2024)   Patient History    Smoking Tobacco Use: Every Day    Smokeless Tobacco Use: Never    Passive Exposure: Not on file  Financial Resource Strain: Not on file  Food Insecurity: Not on file  Transportation Needs: Not on file  Physical Activity: Not on file  Stress: Not on file  Social Connections: Not on  file  Depression (PHQ2-9): Low Risk (07/16/2024)   Depression (PHQ2-9)    PHQ-2 Score: 0  Alcohol Screen: Not on file  Housing: Not on file  Utilities: Not on file  Health Literacy: Not on file    Allergies: No Known Allergies  Metabolic Disorder Labs: Lab Results  Component Value Date   HGBA1C 5.6 04/28/2020   MPG 114 04/28/2020   MPG 114.02 10/08/2019   Lab Results  Component Value Date   PROLACTIN 50.6 (H) 10/08/2019   PROLACTIN 110.3 (H) 11/08/2015   Lab Results  Component Value Date   CHOL 160 04/28/2020   TRIG 44 04/28/2020   HDL 50 04/28/2020   CHOLHDL 3.2 04/28/2020   VLDL 9 04/28/2020   LDLCALC 101 (H) 04/28/2020   LDLCALC 92 10/08/2019   Lab Results  Component Value Date   TSH 0.406 04/28/2020   TSH 0.850 10/08/2019    Therapeutic Level Labs: No results  found for: LITHIUM No results found for: VALPROATE No results found for: CBMZ  Current Medications: Current Outpatient Medications  Medication Sig Dispense Refill   benztropine  (COGENTIN ) 1 MG tablet Take 1 tablet (1 mg total) by mouth 2 (two) times daily. 60 tablet 3   haloperidol  (HALDOL ) 10 MG tablet Take 1 tablet (10 mg total) by mouth 2 (two) times daily. 60 tablet 3   promethazine -dextromethorphan (PROMETHAZINE -DM) 6.25-15 MG/5ML syrup Take 5 mLs by mouth 4 (four) times daily as needed for cough. 140 mL 0   traZODone  (DESYREL ) 50 MG tablet Take 1 tablet (50 mg total) by mouth at bedtime. 30 tablet 3   trimethoprim -polymyxin b  (POLYTRIM ) ophthalmic solution Place 1 drop into the right eye every 6 (six) hours. 10 mL 0   No current facility-administered medications for this visit.     Musculoskeletal: Strength & Muscle Tone: within normal limits Gait & Station: normal Patient leans: N/A  Psychiatric Specialty Exam: Review of Systems  Psychiatric/Behavioral:  Negative for decreased concentration, dysphoric mood, hallucinations, self-injury, sleep disturbance and suicidal ideas. The  patient is not nervous/anxious and is not hyperactive.     There were no vitals taken for this visit.There is no height or weight on file to calculate BMI.  General Appearance: Casual  Eye Contact:  Good  Speech:  Clear and Coherent and Normal Rate  Volume:  Normal  Mood:  Euthymic  Affect:  Appropriate  Thought Process:  Coherent, Goal Directed, and Descriptions of Associations: Intact  Orientation:  Full (Time, Place, and Person)  Thought Content: WDL   Suicidal Thoughts:  No  Homicidal Thoughts:  No  Memory:  Immediate;   Good Recent;   Good Remote;   Good  Judgement:  Good  Insight:  Good  Psychomotor Activity:  Normal  Concentration:  Concentration: Good and Attention Span: Good  Recall:  Good  Fund of Knowledge: Fair  Language: Good  Akathisia:  No  Handed:  Right  AIMS (if indicated): not done  Assets:  Communication Skills Desire for Improvement Housing Vocational/Educational  ADL's:  Intact  Cognition: WNL  Sleep:  Good   Screenings: AIMS    Flowsheet Row Video Visit from 07/16/2024 in [facility] Video Visit from 04/03/2024 in [facility] Clinical Support from 01/04/2024 in [facility]) [facility]  AIMS Total Score 0 0 0 0 0   AUDIT    Flowsheet Row [facility]) [facility] (Discharged[facility] (Discharged) [facility] Ad[facility] (Discharged) from OP [facility]  Alcohol Use Disorder Identification Test Final Score (AUDIT) 0 0 0 0   GAD-7    Flowsheet Row Video Visit from  07/16/2024 in [facility] Video Visit from 04/03/2024 in [facility] Clinical Support from 01/04/2024 in [facility] Video Visit from 10/05/2023 in [facility] Video Visit from 07/06/2023 in [facility]  Total GAD-7 Score 0 0 0 0 0   PHQ2-9    Flowsheet Row Video Visit from 07/16/2024 in [facility] Video Visit from 04/03/2024 in [facility] Clinical Support  from 01/04/2024 in [facility] Video Visit from 10/05/2023 in [facility] Video Visit from 07/06/2023 in [facility]  PHQ-2 Total Score 0 0 0 0 0   Flowsheet Row Video Visit from 07/16/2024 in [facility] Video Visit from 04/03/2024 in [facility] Clinical Support from 01/04/2024 in [facility]  C-SSRS RISK CATEGORY No Risk No Risk No Risk     Assessment and Plan:   Todd Shaw is a 39 year old male with a past psychiatric history significant for bipolar affective disorder (current episode mixed) who presents to [facility] via virtual video visit for follow-up and medication management.  Patient presents to the encounter stating that he has been taking his medications regularly and denies experiencing any adverse side effects.  An aims assessment was performed with the patient scoring a 0.  Patient denies overt depressive symptoms nor does he endorse anxiety. A PHQ 2 screen was performed with the patient scoring a 0.  A GAD-7 screen was also performed with the patient scoring a 0.  Patient further denies recent episodes of mania.  Due to his use of Haldol , the following labs to be obtained from the patient: Hemoglobin A1c, lipid profile,  comprehensive metabolic panel, and complete blood count with differential.  Provider to schedule patient for an EKG appointment during his next assessment.  A [place] Suicide Severity Rating Scale was performed with the patient being considered no risk.  Patient denies suicidal ideations and is able to contract for safety at this time.  Collaboration of Care: Collaboration of Care: Medication Management AEB provider managing patient's psychiatric medications and Psychiatrist AEB patient being followed by a mental health provider  Patient/Guardian was advised Release of Information must be obtained prior to any record release in order to collaborate their care with an outside provider. Patient/Guardian was advised if they have not already done so to contact the registration department to sign all necessary forms in order for us  to release information regarding their care.   Consent: Patient/Guardian gives verbal consent for treatment and assignment of benefits for services provided during this visit. Patient/Guardian expressed understanding and agreed to proceed.   1. Bipolar affective disorder, current episode mixed, current episode severity unspecified (HCC)  - benztropine  (COGENTIN ) 1 MG tablet; Take 1 tablet (1 mg total) by mouth 2 (two) times daily.  Dispense: 60 tablet; Refill: 3 - haloperidol  (HALDOL ) 10 MG tablet; Take 1 tablet (10 mg total) by mouth 2 (two) times daily.  Dispense: 60 tablet; Refill: 3  2. Long term current use of antipsychotic medication (Primary)  - HgB A1c; Future - Lipid panel; Future - Comprehensive Metabolic Panel (CMET); Future - CBC with Differential/Platelet; Future  Patient to follow up in 3 months Provider spent a total of 8 minutes with the patient/reviewing the patient's chart  Reginia FORBES Bolster, PA 07/16/2024, 10:00 AM

## 2024-10-17 ENCOUNTER — Telehealth (HOSPITAL_COMMUNITY): Admitting: Physician Assistant
